# Patient Record
Sex: Female | Born: 1960 | Race: Black or African American | Hispanic: No | Marital: Married | Smoking: Never smoker
Health system: Southern US, Community
[De-identification: ages and names within clinical notes are randomized; demographics above are authoritative.]

## PROBLEM LIST (undated history)

## (undated) DIAGNOSIS — E039 Hypothyroidism, unspecified: Secondary | ICD-10-CM

## (undated) DIAGNOSIS — Z923 Personal history of irradiation: Secondary | ICD-10-CM

## (undated) DIAGNOSIS — Z9221 Personal history of antineoplastic chemotherapy: Secondary | ICD-10-CM

## (undated) DIAGNOSIS — Z803 Family history of malignant neoplasm of breast: Secondary | ICD-10-CM

## (undated) DIAGNOSIS — F419 Anxiety disorder, unspecified: Secondary | ICD-10-CM

## (undated) DIAGNOSIS — Z1371 Encounter for nonprocreative screening for genetic disease carrier status: Secondary | ICD-10-CM

## (undated) DIAGNOSIS — M1990 Unspecified osteoarthritis, unspecified site: Secondary | ICD-10-CM

## (undated) DIAGNOSIS — R51 Headache: Secondary | ICD-10-CM

## (undated) DIAGNOSIS — M2550 Pain in unspecified joint: Secondary | ICD-10-CM

## (undated) DIAGNOSIS — C50919 Malignant neoplasm of unspecified site of unspecified female breast: Secondary | ICD-10-CM

## (undated) HISTORY — PX: BREAST REDUCTION SURGERY: SHX8

## (undated) HISTORY — DX: Pain in unspecified joint: M25.50

## (undated) HISTORY — DX: Unspecified osteoarthritis, unspecified site: M19.90

## (undated) HISTORY — DX: Malignant neoplasm of unspecified site of unspecified female breast: C50.919

## (undated) HISTORY — PX: OTHER SURGICAL HISTORY: SHX169

## (undated) HISTORY — PX: CERVICAL CONE BIOPSY: SUR198

## (undated) HISTORY — DX: Family history of malignant neoplasm of breast: Z80.3

## (undated) HISTORY — PX: STERIOD INJECTION: SHX5046

---

## 1999-11-20 HISTORY — PX: REDUCTION MAMMAPLASTY: SUR839

## 2001-01-01 LAB — CBC AND DIFFERENTIAL
HCT: 44 (ref 36–46)
Hemoglobin: 13.7 (ref 12.0–16.0)
Platelets: 284 (ref 150–399)
WBC: 5.4

## 2001-01-01 LAB — BASIC METABOLIC PANEL
BUN: 10 (ref 4–21)
Creatinine: 1 (ref <=1.1)
Glucose: 81
Potassium: 4.1 (ref 3.4–5.3)
Sodium: 139 (ref 137–147)

## 2001-01-01 LAB — HEPATIC FUNCTION PANEL
ALT: 15 (ref 7–35)
AST: 15 (ref 13–35)
Alkaline Phosphatase: 39 (ref 25–125)

## 2001-01-01 LAB — LIPID PANEL
Cholesterol: 201 — AB (ref 0–200)
HDL: 64 (ref 35–70)
LDL Cholesterol: 123
Triglycerides: 70 (ref 40–160)

## 2001-01-01 LAB — TSH: TSH: 2.2 (ref <=5.90)

## 2001-02-27 ENCOUNTER — Encounter: Payer: Self-pay | Admitting: Internal Medicine

## 2001-02-27 ENCOUNTER — Ambulatory Visit (HOSPITAL_COMMUNITY): Admission: RE | Admit: 2001-02-27 | Discharge: 2001-02-27 | Payer: Self-pay | Admitting: Internal Medicine

## 2001-07-08 ENCOUNTER — Encounter (HOSPITAL_COMMUNITY): Admission: RE | Admit: 2001-07-08 | Discharge: 2001-08-07 | Payer: Self-pay | Admitting: Sports Medicine

## 2002-03-06 ENCOUNTER — Encounter: Payer: Self-pay | Admitting: Internal Medicine

## 2002-03-06 ENCOUNTER — Ambulatory Visit (HOSPITAL_COMMUNITY): Admission: RE | Admit: 2002-03-06 | Discharge: 2002-03-06 | Payer: Self-pay | Admitting: Obstetrics and Gynecology

## 2002-03-06 LAB — HM MAMMOGRAPHY: HM Mammogram: NORMAL (ref 0–4)

## 2002-03-20 ENCOUNTER — Ambulatory Visit (HOSPITAL_COMMUNITY): Admission: RE | Admit: 2002-03-20 | Discharge: 2002-03-20 | Payer: Self-pay | Admitting: General Surgery

## 2002-09-07 ENCOUNTER — Encounter (INDEPENDENT_AMBULATORY_CARE_PROVIDER_SITE_OTHER): Payer: Self-pay | Admitting: *Deleted

## 2002-09-07 ENCOUNTER — Ambulatory Visit (HOSPITAL_BASED_OUTPATIENT_CLINIC_OR_DEPARTMENT_OTHER): Admission: RE | Admit: 2002-09-07 | Discharge: 2002-09-08 | Payer: Self-pay | Admitting: Specialist

## 2002-11-19 DIAGNOSIS — N870 Mild cervical dysplasia: Secondary | ICD-10-CM | POA: Insufficient documentation

## 2003-05-04 ENCOUNTER — Other Ambulatory Visit: Admission: RE | Admit: 2003-05-04 | Discharge: 2003-05-04 | Payer: Self-pay | Admitting: Obstetrics & Gynecology

## 2003-05-05 ENCOUNTER — Other Ambulatory Visit: Admission: RE | Admit: 2003-05-05 | Discharge: 2003-05-05 | Payer: Self-pay | Admitting: Obstetrics & Gynecology

## 2003-06-11 ENCOUNTER — Encounter (INDEPENDENT_AMBULATORY_CARE_PROVIDER_SITE_OTHER): Payer: Self-pay | Admitting: *Deleted

## 2003-06-11 ENCOUNTER — Ambulatory Visit (HOSPITAL_COMMUNITY): Admission: RE | Admit: 2003-06-11 | Discharge: 2003-06-11 | Payer: Self-pay | Admitting: Obstetrics & Gynecology

## 2003-10-29 ENCOUNTER — Other Ambulatory Visit: Admission: RE | Admit: 2003-10-29 | Discharge: 2003-10-29 | Payer: Self-pay | Admitting: Obstetrics & Gynecology

## 2004-06-05 ENCOUNTER — Other Ambulatory Visit: Admission: RE | Admit: 2004-06-05 | Discharge: 2004-06-05 | Payer: Self-pay | Admitting: Obstetrics & Gynecology

## 2005-04-07 ENCOUNTER — Emergency Department (HOSPITAL_COMMUNITY): Admission: EM | Admit: 2005-04-07 | Discharge: 2005-04-07 | Payer: Self-pay | Admitting: Emergency Medicine

## 2005-05-08 LAB — BASIC METABOLIC PANEL
BUN: 7 (ref 4–21)
Creatinine: 0.9 (ref <=1.1)
Glucose: 90
Potassium: 4 (ref 3.4–5.3)
Sodium: 142 (ref 137–147)

## 2005-05-08 LAB — CBC AND DIFFERENTIAL
HCT: 39 (ref 36–46)
Hemoglobin: 12.6 (ref 12.0–16.0)
Platelets: 244 (ref 150–399)
WBC: 6.2

## 2005-05-08 LAB — LIPID PANEL
Cholesterol: 193 (ref 0–200)
HDL: 53 (ref 35–70)
LDL Cholesterol: 123
Triglycerides: 86 (ref 40–160)

## 2005-05-08 LAB — TSH: TSH: 1.97 (ref <=5.90)

## 2005-05-08 LAB — COMPREHENSIVE METABOLIC PANEL: Calcium: 9 (ref 8.7–10.7)

## 2006-01-26 ENCOUNTER — Ambulatory Visit (HOSPITAL_COMMUNITY): Admission: RE | Admit: 2006-01-26 | Discharge: 2006-01-26 | Payer: Self-pay | Admitting: Family Medicine

## 2006-02-03 ENCOUNTER — Emergency Department (HOSPITAL_COMMUNITY): Admission: EM | Admit: 2006-02-03 | Discharge: 2006-02-03 | Payer: Self-pay | Admitting: Emergency Medicine

## 2006-09-22 ENCOUNTER — Emergency Department (HOSPITAL_COMMUNITY): Admission: EM | Admit: 2006-09-22 | Discharge: 2006-09-22 | Payer: Self-pay | Admitting: Emergency Medicine

## 2007-07-23 LAB — LIPID PANEL
Cholesterol: 183 (ref 0–200)
HDL: 56 (ref 35–70)
LDL Cholesterol: 109
Triglycerides: 88 (ref 40–160)

## 2007-07-23 LAB — BASIC METABOLIC PANEL
BUN: 9 (ref 4–21)
Creatinine: 0.8 (ref <=1.1)
Glucose: 80
Potassium: 4.6 (ref 3.4–5.3)
Sodium: 140 (ref 137–147)

## 2007-07-23 LAB — CBC AND DIFFERENTIAL
HCT: 39 (ref 36–46)
Hemoglobin: 13 (ref 12.0–16.0)
Platelets: 283 (ref 150–399)
WBC: 5.5

## 2007-07-23 LAB — TSH: TSH: 1.38 (ref <=5.90)

## 2007-07-23 LAB — HEPATIC FUNCTION PANEL
ALT: 9 (ref 7–35)
AST: 13 (ref 13–35)
Alkaline Phosphatase: 43 (ref 25–125)

## 2007-07-23 LAB — COMPREHENSIVE METABOLIC PANEL: Calcium: 8.8 (ref 8.7–10.7)

## 2008-03-15 LAB — CBC AND DIFFERENTIAL
HCT: 42 (ref 36–46)
Hemoglobin: 14 (ref 12.0–16.0)
Platelets: 291 (ref 150–399)
WBC: 10.3

## 2009-04-20 LAB — LIPID PANEL
Cholesterol: 197 (ref 0–200)
HDL: 53 (ref 35–70)
LDL Cholesterol: 129
Triglycerides: 77 (ref 40–160)

## 2009-04-20 LAB — TSH: TSH: 5.11 (ref <=5.90)

## 2009-04-21 LAB — BASIC METABOLIC PANEL
BUN: 9 (ref 4–21)
Creatinine: 0.9 (ref <=1.1)
Glucose: 98
Potassium: 4.7 (ref 3.4–5.3)
Sodium: 142 (ref 137–147)

## 2009-04-21 LAB — HEPATIC FUNCTION PANEL
ALT: 12 (ref 7–35)
AST: 16 (ref 13–35)
Alkaline Phosphatase: 42 (ref 25–125)

## 2009-05-26 LAB — TSH: TSH: 3.36 (ref 0.41–5.90)

## 2009-06-14 ENCOUNTER — Ambulatory Visit (HOSPITAL_COMMUNITY): Admission: RE | Admit: 2009-06-14 | Discharge: 2009-06-14 | Payer: Self-pay | Admitting: Family Medicine

## 2009-11-28 LAB — TSH: TSH: 1.46 (ref 0.41–5.90)

## 2010-12-23 ENCOUNTER — Emergency Department (HOSPITAL_COMMUNITY)
Admission: EM | Admit: 2010-12-23 | Discharge: 2010-12-23 | Disposition: A | Discharge status: Home / Self Care | Payer: 59 | Attending: Emergency Medicine | Admitting: Emergency Medicine

## 2010-12-23 DIAGNOSIS — R197 Diarrhea, unspecified: Secondary | ICD-10-CM | POA: Insufficient documentation

## 2010-12-23 DIAGNOSIS — N390 Urinary tract infection, site not specified: Secondary | ICD-10-CM | POA: Insufficient documentation

## 2010-12-23 DIAGNOSIS — F411 Generalized anxiety disorder: Secondary | ICD-10-CM | POA: Insufficient documentation

## 2010-12-23 DIAGNOSIS — R55 Syncope and collapse: Secondary | ICD-10-CM | POA: Insufficient documentation

## 2010-12-23 DIAGNOSIS — R112 Nausea with vomiting, unspecified: Secondary | ICD-10-CM | POA: Insufficient documentation

## 2010-12-23 DIAGNOSIS — E039 Hypothyroidism, unspecified: Secondary | ICD-10-CM | POA: Insufficient documentation

## 2010-12-23 DIAGNOSIS — Z79899 Other long term (current) drug therapy: Secondary | ICD-10-CM | POA: Insufficient documentation

## 2010-12-23 LAB — BASIC METABOLIC PANEL
CO2: 25 mEq/L (ref 19–32)
Calcium: 9.1 mg/dL (ref 8.4–10.5)
Chloride: 101 mEq/L (ref 96–112)
Glucose, Bld: 115 mg/dL — ABNORMAL HIGH (ref 70–99)
Sodium: 135 mEq/L (ref 135–145)

## 2010-12-23 LAB — CBC
HCT: 40.4 % (ref 36.0–46.0)
Hemoglobin: 14.4 g/dL (ref 12.0–15.0)
MCH: 28.7 pg (ref 26.0–34.0)
MCHC: 35.6 g/dL (ref 30.0–36.0)
RBC: 5.02 MIL/uL (ref 3.87–5.11)

## 2010-12-23 LAB — URINALYSIS, ROUTINE W REFLEX MICROSCOPIC
Bilirubin Urine: NEGATIVE
Specific Gravity, Urine: >1.03 — ABNORMAL HIGH (ref 1.005–1.030)
Urine Glucose, Fasting: NEGATIVE mg/dL
Urobilinogen, UA: 0.2 mg/dL (ref 0.0–1.0)
pH: 6 (ref 5.0–8.0)

## 2010-12-23 LAB — DIFFERENTIAL
Basophils Absolute: 0 10*3/uL (ref 0.0–0.1)
Lymphocytes Relative: 8 % — ABNORMAL LOW (ref 12–46)
Monocytes Absolute: 0.6 10*3/uL (ref 0.1–1.0)
Monocytes Relative: 7 % (ref 3–12)
Neutro Abs: 7.7 10*3/uL (ref 1.7–7.7)

## 2010-12-23 LAB — URINE MICROSCOPIC-ADD ON

## 2010-12-24 LAB — URINE CULTURE

## 2011-01-06 ENCOUNTER — Emergency Department (HOSPITAL_COMMUNITY)
Admission: EM | Admit: 2011-01-06 | Discharge: 2011-01-06 | Disposition: A | Discharge status: Home / Self Care | Payer: 59 | Attending: Emergency Medicine | Admitting: Emergency Medicine

## 2011-01-06 DIAGNOSIS — F411 Generalized anxiety disorder: Secondary | ICD-10-CM | POA: Insufficient documentation

## 2011-01-06 DIAGNOSIS — R42 Dizziness and giddiness: Secondary | ICD-10-CM | POA: Insufficient documentation

## 2011-01-06 DIAGNOSIS — R110 Nausea: Secondary | ICD-10-CM | POA: Insufficient documentation

## 2011-01-06 DIAGNOSIS — E039 Hypothyroidism, unspecified: Secondary | ICD-10-CM | POA: Insufficient documentation

## 2011-01-06 DIAGNOSIS — Z79899 Other long term (current) drug therapy: Secondary | ICD-10-CM | POA: Insufficient documentation

## 2011-01-06 LAB — URINALYSIS, ROUTINE W REFLEX MICROSCOPIC
Bilirubin Urine: NEGATIVE
Hgb urine dipstick: NEGATIVE
Specific Gravity, Urine: 1.02 (ref 1.005–1.030)
Urine Glucose, Fasting: NEGATIVE mg/dL
pH: 6.5 (ref 5.0–8.0)

## 2011-04-06 NOTE — Op Note (Signed)
NAME:  Robin Franklin, Robin Franklin                             ACCOUNT NO.:  [id number]   MEDICAL RECORD NO.:  [id number]                   PATIENT TYPE:  AMB   LOCATION:  DSC                                  FACILITY:  [facility]   PHYSICIAN:  Earvin Hansen L. Shon Hough, M.D.           DATE OF BIRTH:  1961/10/04   DATE OF PROCEDURE:  09/07/2002  DATE OF DISCHARGE:                                 OPERATIVE REPORT   INDICATIONS FOR PROCEDURE:  A 50 year old lady with severe macromastia and  back and shoulder pain secondary to large pendulous breasts.  The patient  also has increased collection of increased accessory breast tissue to the  right and left axillary and latissimus dorsi regions, but also contributing  to her problems.   PROCEDURE:  Bilateral breast reductions using the inferior pedicle  technique, reduction of accessory breast tissue.   SURGEON:  Yaakov Guthrie. Shon Hough, M.D. and Alethia Berthold.   ASSISTANT:  CFA.   ANESTHESIA:  General anesthesia.   Preoperatively, the patient was set up and drawn for the inferior pedicle  reduction mammoplasty remarking.  The nipple areolar complexes 20 cm from  the suprasternal notch.   DESCRIPTION OF PROCEDURE:  She then underwent general anesthesia intubated  orally.  Prep was done to the chest and breast areas in routine fashion  using Betadine soap and solution and walled of with sterile towels and  drapes so as to make a sterile field.  0.25% Xylocaine with epinephrine was  injected locally, a total of 150 cc per side of 1:400,000 concentration.  After the patient received general anesthesia, intubation, and prep, the  wounds were scored with #15 blade.  The inferior pedicle was  deepithelialized using #20 blade. The medial and lateral fatty dermal  pedicles were excised down to underlying fashion.  Hemostasis was maintained  with the Bovie unit on coagulation.  Next, a new keyhole area was also  debulked and thinned.  Out laterally more tissue was removed  and especially  accessory breast tissue in the axillary regions using liposuction  assistance.  After proper hemostasis, the fascia was then transposed and  stayed with 3-0 Prolene.  Subcutaneous closure was done with 3-0 Monocryl x2  layers and then a running subcuticular stitch of 3-0 Monocryl and 5-0  Monocryl throughout the inverted T. The wounds were drained with #10 fully-  fluted Blake drain which was placed in the depths of the wound and brought  out through the lateralmost portion of the incision and secured with 3-0  Prolene. The wounds were cleansed, Steri-Strips were applied, including  Xeroform, 4 x 4s, ABDs, and Hypafix tape. She withstood the procedures very  well. At the end of the procedure, the nipples were very supple with good  blood supply to the nipple areolar complex.  She will be taken to the  recovery room and then admitted for overnight stay.  She will be  see me back  in the office this Thursday for reexamination.  She is to call me for any  medical problems.  Instructions will be given for wound care and drain care.                                               Yaakov Guthrie. Shon Hough, M.D.    Cathie Hoops  D:  09/07/2002  T:  09/07/2002  Job:  [id number]

## 2011-04-06 NOTE — H&P (Signed)
[facility]  Patient:    Robin Franklin, Robin Franklin Visit Number: [id number] MRN: [id number]          Service Type: OUT Location: RAD Attending Physician:  Cassell Smiles. Dictated by:   Elpidio Anis, M.D. Admit Date:  03/06/2002 Discharge Date: 03/06/2002                           History and Physical  BIRTHDAY:  [birth year]  HISTORY OF PRESENT ILLNESS:  A 50 year old female with a history of a mass of the right posterior neck.  The mass has gradually increased in size.  She has increasing pain.  She was seen in urgent care and started on Keflex.  She has had mild headaches which she feels are related to the mass.  She is scheduled for excision.  PAST HISTORY:  She has no known medical illness.  REVIEW OF SYSTEMS:  Negative.  MEDICATIONS:  Fioricet and Keflex which were given for the new onset of headaches and the neck mass.  ALLERGIES:  She has no known drug allergies.  REVIEW OF SYSTEMS:  Totally negative.  PHYSICAL EXAMINATION:  VITAL SIGNS:  Blood pressure 110/62, pulse 64, respirations 18, weight 189 pounds.  HEENT:  Unremarkable.  NECK:  Supple without JVD or bruit.  She has a 4-cm tender mass on the right neck posteriorly near the hairline.  There is no associated adenopathy.  CHEST:  Clear to auscultation.  HEART:  Regular rate and rhythm without murmur, gallop or rub.  ABDOMEN:  Soft, nontender.  EXTREMITIES:  Unremarkable.  NEUROLOGIC EXAM:  No focal deficits.  IMPRESSION:  Soft tissue mass, right posterior neck.  PLAN:  Excision in the OR. Dictated by:   Elpidio Anis, M.D. Attending Physician:  Cassell Smiles DD:  03/19/02 TD:  03/20/02 Job: [id number]

## 2011-04-06 NOTE — Op Note (Signed)
[facility]  Patient:    Robin Franklin, Robin Franklin Visit Number: [id number] MRN: [id number]          Service Type: DSU Location: DAY Attending Physician:  Dessa Phi Dictated by:   Elpidio Anis, M.D. Proc. Date: 03/20/02 Admit Date:  03/20/2002 Discharge Date: 03/20/2002                             Operative Report  PREOPERATIVE DIAGNOSIS:  Soft tissue mass, right neck.  POSTOPERATIVE DIAGNOSIS:  Soft tissue mass, right neck.  PROCEDURE:  Excision and primary closure of soft tissue mass, right neck.  SURGEON:  Elpidio Anis, M.D.  DESCRIPTION OF PROCEDURE:  Under general anesthesia, the right neck was prepped and draped in a sterile field. An elliptical incision was made surrounding the dorsum of the mass and extended in the subcutaneous tissue. In the deep subcutaneous tissue, electrocautery was used to encircle the mass. It appeared to be fibrotic but also had some sebum type material suggesting that it was an old epidermoid cyst with scarring and inflammation. The mass was excised down to the muscle of the posterior neck. Hemostasis was achieved. The deep tissues were removed because of some residual tracking of the mass. The tissues were closed with 2-0 Biosyn and subcutaneous tissue was closed with 3-0 Biosyn. The skin was closed with running 4-0 Prolene. An Op-Site dressing was placed. The patient tolerated the procedure well. She was awakened from anesthesia and transferred to a bed and taken to the post anesthesia care unit. Dictated by:   Elpidio Anis, M.D. Attending Physician:  Dessa Phi DD:  03/20/02 TD:  03/22/02 Job: [id number]

## 2011-04-06 NOTE — Op Note (Signed)
NAMEROY, SNUFFER                             ACCOUNT NO.:  [id number]   MEDICAL RECORD NO.:  [id number]                   PATIENT TYPE:  OUT   LOCATION:  SDC                                  FACILITY:  [facility]   PHYSICIAN:  Gerrit Friends. Aldona Bar, M.D.                DATE OF BIRTH:  1961-07-18   DATE OF PROCEDURE:  06/11/2003  DATE OF DISCHARGE:                                 OPERATIVE REPORT   PREOPERATIVE DIAGNOSES:  1. Cervical intraepithelial neoplasia I.  2. Human papilloma virus positive.  3. Inadequate colposcopy.   POSTOPERATIVE DIAGNOSES:  1. Cervical intraepithelial neoplasia I.  2. Human papilloma virus positive.  3. Inadequate colposcopy.  4. Pathology pending.   PROCEDURE:  Conization of cervix.   ANESTHESIA:  General.   PROCEDURE:  The patient was taken to the operating room, where after the  satisfactory induction of general anesthetic she was prepped and draped,  having been placed in the modified lithotomy position in the short Allen  stirrups.  A speculum was placed in the vagina, the cervix visualized, and a  single-tooth tenaculum placed on the posterior lip of the cervix.  The  cervix at this time was injected circumferentially with a dilute solution of  1% Xylocaine with epinephrine.  Thereafter using the knife a conization  specimen was taken encompassing the entire exocervix and as much of the  endocervix as possible.  This was carried out until the depth was felt to be  as much as could be taken to encompass the entire endocervix.  The specimen  was removed, labeled appropriately at 10 and 12 o'clock, and sent to  pathology.  The depth of the canal was at least 1.5 cm.  The remaining  endocervix was then curetted and specimens sent labeled separately.  At this  time using the coagulating ball the bed of the cone was coagulated  adequately, providing excellent hemostasis.  The bed of the cone was then  saturated with Monsel's solution for additional  hemostasis.  At this time  the procedure was felt to be complete and was terminated.  The patient was  taken to the recovery room in satisfactory condition, having tolerated the  procedure well.  Estimated blood loss 25 mL.  All counts correct x2.  The  specimen included the conization as well as the residual endocervical  curettings.   The patient will be discharged to home with appropriate instructions.  She  will be asked to return to the office in about two to three weeks for follow-  up.  She will use over-the-counter Aleve or Advil as needed for cramping.  Gerrit Friends. Aldona Bar, M.D.    RMW/MEDQ  D:  06/11/2003  T:  06/11/2003  Job:  [id number]

## 2011-09-19 ENCOUNTER — Telehealth: Payer: Self-pay

## 2011-09-19 NOTE — Telephone Encounter (Signed)
MOVIPREP SPLIT DOSING

## 2011-09-19 NOTE — Telephone Encounter (Signed)
Pt to call back to schedule appt. Has to get her work schedule.     Gastroenterology Pre-Procedure Form  Request Date: 09/18/2011       Requesting Physician: Dr. Sherwood Gambler     PATIENT INFORMATION:  Robin Franklin is a 50 y.o., female (DOB=February 12, 1961).  PROCEDURE: Procedure(s) requested: colonoscopy Procedure Reason: screening for colon cancer  PATIENT REVIEW QUESTIONS: The patient reports the following:   1. Diabetes Melitis: no 2. Joint replacements in the past 12 months: no 3. Major health problems in the past 3 months: no 4. Has an artificial valve or MVP:no 5. Has been advised in past to take antibiotics in advance of a procedure like teeth cleaning: no}    MEDICATIONS & ALLERGIES:    Patient reports the following regarding taking any blood thinners:   Plavix? no Aspirin?no Coumadin?  no  Patient confirms/reports the following medications:  Current Outpatient Prescriptions  Medication Sig Dispense Refill  . ALPRAZolam (XANAX) 0.5 MG tablet Take 0.5 mg by mouth at bedtime as needed.        Marland Kitchen ibuprofen (ADVIL,MOTRIN) 200 MG tablet Take 200 mg by mouth every 6 (six) hours as needed.        Marland Kitchen levothyroxine (SYNTHROID, LEVOTHROID) 50 MCG tablet Take 50 mcg by mouth daily.          Patient confirms/reports the following allergies:  No Known Allergies  Patient is appropriate to schedule for requested procedure(s): yes  AUTHORIZATION INFORMATION Primary Insurance:   ID #:   Group #:  Pre-Cert / Auth required:  Pre-Cert / Auth #:   Secondary Insurance:   ID #:   Group #:  Pre-Cert / Auth required: } Pre-Cert / Auth #:   No orders of the defined types were placed in this encounter.    SCHEDULE INFORMATION: Procedure has been scheduled as follows:  Date:             Time: Location:[facility] Short Stay  This Gastroenterology Pre-Precedure Form is being routed to the following provider(s) for review: Jonette Eva, MD

## 2011-09-24 NOTE — Telephone Encounter (Signed)
Called pt, her schedule still does not permit her to schedule yet. She will call back when she gets a new schedule in a couple of weeks.

## 2011-10-08 NOTE — Telephone Encounter (Signed)
Letter mailed to pt to call when she gets her schedule and we will get the colonoscopy scheduled.

## 2011-10-18 ENCOUNTER — Telehealth: Payer: Self-pay

## 2011-10-18 ENCOUNTER — Other Ambulatory Visit: Payer: Self-pay

## 2011-10-18 DIAGNOSIS — Z139 Encounter for screening, unspecified: Secondary | ICD-10-CM

## 2011-10-18 NOTE — Progress Notes (Signed)
Rx and instructions mailed to pt. NOTE: TIME CHANGE FROM 11:30 TO 12:00.

## 2011-10-18 NOTE — Telephone Encounter (Signed)
MOVI PREP SPLIT DOSING, REGULAR BREAKFAST. CLEAR LIQUIDS AFTER 9 AM.  

## 2011-10-18 NOTE — Telephone Encounter (Signed)
Gastroenterology Pre-Procedure Form  Request Date: 10/17/2011      Requesting Physician: Dr. Sherwood Gambler     PATIENT INFORMATION:  Robin Franklin is a 50 y.o., female (DOB=[birth year]).  PROCEDURE: Procedure(s) requested: colonoscopy Procedure Reason: screening for colon cancer  PATIENT REVIEW QUESTIONS: The patient reports the following:   1. Diabetes Melitis: no 2. Joint replacements in the past 12 months: no 3. Major health problems in the past 3 months: no 4. Has an artificial valve or MVP:no 5. Has been advised in past to take antibiotics in advance of a procedure like teeth cleaning: no}    MEDICATIONS & ALLERGIES:    Patient reports the following regarding taking any blood thinners:   Plavix? no Aspirin?no Coumadin?  no  Patient confirms/reports the following medications:  Current Outpatient Prescriptions  Medication Sig Dispense Refill  . ALPRAZolam (XANAX) 0.5 MG tablet Take 0.5 mg by mouth at bedtime as needed.        Marland Kitchen ibuprofen (ADVIL,MOTRIN) 200 MG tablet Take 200 mg by mouth every 6 (six) hours as needed.        Marland Kitchen levothyroxine (SYNTHROID, LEVOTHROID) 50 MCG tablet Take 50 mcg by mouth daily.          Patient confirms/reports the following allergies:  No Known Allergies  Patient is appropriate to schedule for requested procedure(s): yes  AUTHORIZATION INFORMATION Primary Insurance:   ID #:   Group #:  Pre-Cert / Auth required:  Pre-Cert / Auth #:   Secondary Insurance:   ID #:  Group #:  Pre-Cert / Auth required:  Pre-Cert / Auth #:   No orders of the defined types were placed in this encounter.    SCHEDULE INFORMATION: Procedure has been scheduled as follows:  Date: 11/06/2011      Time:  11:30 AM Location: [facility] Short Stay  This Gastroenterology Pre-Precedure Form is being routed to the following provider(s) for review: Jonette Eva, MD

## 2011-10-29 ENCOUNTER — Encounter (HOSPITAL_COMMUNITY): Payer: Self-pay | Admitting: Pharmacy Technician

## 2011-11-02 ENCOUNTER — Other Ambulatory Visit: Payer: Self-pay | Admitting: Obstetrics & Gynecology

## 2011-11-05 MED ORDER — SODIUM CHLORIDE 0.45 % IV SOLN
Freq: Once | INTRAVENOUS | Status: AC
  Administered 2011-11-06: 11:00:00 via INTRAVENOUS

## 2011-11-06 ENCOUNTER — Encounter (HOSPITAL_COMMUNITY): Payer: Self-pay | Admitting: *Deleted

## 2011-11-06 ENCOUNTER — Ambulatory Visit (HOSPITAL_COMMUNITY)
Admission: RE | Admit: 2011-11-06 | Discharge: 2011-11-06 | Disposition: A | Discharge status: Home / Self Care | Payer: 59 | Source: Ambulatory Visit | Attending: Gastroenterology | Admitting: Gastroenterology

## 2011-11-06 ENCOUNTER — Encounter (HOSPITAL_COMMUNITY)
Admission: RE | Disposition: A | Discharge status: Home / Self Care | Payer: Self-pay | Source: Ambulatory Visit | Attending: Gastroenterology

## 2011-11-06 DIAGNOSIS — Z1211 Encounter for screening for malignant neoplasm of colon: Secondary | ICD-10-CM | POA: Insufficient documentation

## 2011-11-06 DIAGNOSIS — Z139 Encounter for screening, unspecified: Secondary | ICD-10-CM

## 2011-11-06 DIAGNOSIS — K648 Other hemorrhoids: Secondary | ICD-10-CM | POA: Insufficient documentation

## 2011-11-06 DIAGNOSIS — K5730 Diverticulosis of large intestine without perforation or abscess without bleeding: Secondary | ICD-10-CM | POA: Insufficient documentation

## 2011-11-06 HISTORY — PX: COLONOSCOPY: SHX5424

## 2011-11-06 HISTORY — DX: Headache: R51

## 2011-11-06 HISTORY — DX: Hypothyroidism, unspecified: E03.9

## 2011-11-06 HISTORY — DX: Anxiety disorder, unspecified: F41.9

## 2011-11-06 SURGERY — COLONOSCOPY
Anesthesia: Moderate Sedation

## 2011-11-06 MED ORDER — MIDAZOLAM HCL 5 MG/5ML IJ SOLN
INTRAMUSCULAR | Status: AC
  Filled 2011-11-06: qty 10

## 2011-11-06 MED ORDER — MEPERIDINE HCL 100 MG/ML IJ SOLN
INTRAMUSCULAR | Status: DC | PRN
  Administered 2011-11-06 (×2): 25 mg via INTRAVENOUS

## 2011-11-06 MED ORDER — MEPERIDINE HCL 100 MG/ML IJ SOLN
INTRAMUSCULAR | Status: AC
  Filled 2011-11-06: qty 2

## 2011-11-06 MED ORDER — MIDAZOLAM HCL 5 MG/5ML IJ SOLN
INTRAMUSCULAR | Status: DC | PRN
  Administered 2011-11-06: 1 mg via INTRAVENOUS
  Administered 2011-11-06 (×2): 2 mg via INTRAVENOUS

## 2011-11-06 NOTE — Op Note (Signed)
[facility] [address] [place], [place]  [address]  COLONOSCOPY PROCEDURE REPORT  PATIENT:  Robin Franklin, Robin Franklin  MR#:  [id number] BIRTHDATE:  1961/07/05, 50 yrs. old  GENDER:  female  ENDOSCOPIST:  Jonette Eva, MD REF. BY:  Annamaria Helling, M.D. ASSISTANT:  PROCEDURE DATE:  11/06/2011 PROCEDURE:  Colonoscopy 62130  INDICATIONS:  avereage risk screening  MEDICATIONS:   Demerol 50 mg IV, Versed 5 mg IV  DESCRIPTION OF PROCEDURE:    Physical exam was performed. Informed consent was obtained from the patient after explaining the benefits, risks, and alternatives to procedure.  The patient was connected to monitor and placed in left lateral position. Continuous oxygen was provided by nasal cannula and IV medicine administered through an indwelling cannula.  After administration of sedation and rectal exam, the patient's rectum was intubated and the EC-3890Li ([id number]) colonoscope was advanced under direct visualization to the cecum.  The scope was removed slowly by carefully examining the color, texture, anatomy, and integrity mucosa on the way out.  The patient was recovered in endoscopy and discharged home in satisfactory condition. <<PROCEDUREIMAGES>>  FINDINGS:  Diverticula were found in the sigmoid colon.  Internal Hemorrhoids were found. NO POLYPS.  PREP QUALITY: EXCELLENT CECAL W/D TIME:   11 minutes  COMPLICATIONS:    None  ENDOSCOPIC IMPRESSION: 1) MILD Diverticulosis in the sigmoid colon 2) Internal hemorrhoids  RECOMMENDATIONS: TCS in 10 years W/ PEDS SCOPE HIGH FIBER DIET  REPEAT EXAM:  No  ______________________________ Jonette Eva, MD  CC:  Annamaria Helling, M.D.  n. eSIGNED:   Ladawn Boullion at 11/06/2011 01:42 PM  [place] Lack, [phone]

## 2011-11-06 NOTE — H&P (Signed)
  Primary Care Physician:  Cassell Smiles., MD Primary Gastroenterologist:  Dr. Darrick Penna  Pre-Procedure History & Physical: HPI:  Robin Franklin is a 50 y.o. female here for colon ca screening  Past Medical History  Diagnosis Date  . Hypothyroidism   . Headache   . Anxiety     Past Surgical History  Procedure Date  . Breast reduction surgery   . Cervical cone biopsy   . Cyst removed from neck     Prior to Admission medications   Medication Sig Start Date End Date Taking? Authorizing Provider  levothyroxine (SYNTHROID, LEVOTHROID) 50 MCG tablet Take 50 mcg by mouth daily.     Yes Historical Provider, MD  ALPRAZolam Prudy Feeler) 0.5 MG tablet Take 0.5 mg by mouth daily as needed. For anxiety    Historical Provider, MD  ibuprofen (ADVIL,MOTRIN) 200 MG tablet Take 400 mg by mouth every 6 (six) hours as needed. For pain    Historical Provider, MD    Allergies as of 10/18/2011  . (No Known Allergies)    Family History  Problem Relation Age of Onset  . Colon cancer Neg Hx     History   Social History  . Marital Status: Married    Spouse Name: N/A    Number of Children: N/A  . Years of Education: N/A   Occupational History  . Not on file.   Social History Main Topics  . Smoking status: Never Smoker   . Smokeless tobacco: Not on file  . Alcohol Use: No  . Drug Use: No  . Sexually Active:    Other Topics Concern  . Not on file   Social History Narrative  . No narrative on file    Review of Systems: See HPI, otherwise negative ROS lmp: dec 8   Physical Exam: BP 124/81  Pulse 76  Temp(Src) 98.1 F (36.7 C) (Oral)  Resp 14  Ht 5\' 7"  (1.702 m)  Wt 180 lb (81.647 kg)  BMI 28.19 kg/m2  SpO2 100%  LMP 10/27/2011 General:   Alert,  pleasant and cooperative in NAD Head:  Normocephalic and atraumatic. Neck:  Supple; Lungs:  Clear throughout to auscultation.    Heart:  Regular rate and rhythm. Abdomen:  Soft, nontender and nondistended. Normal bowel sounds, without  guarding, and without rebound.   Neurologic:  Alert and  oriented x4;  grossly normal neurologically.  Impression/Plan:    AVERAGE RISK  PLAN: TCS TODAY

## 2011-11-19 ENCOUNTER — Encounter (HOSPITAL_COMMUNITY): Payer: Self-pay | Admitting: Gastroenterology

## 2012-03-14 ENCOUNTER — Other Ambulatory Visit (HOSPITAL_COMMUNITY): Payer: Self-pay | Admitting: Internal Medicine

## 2012-03-14 DIAGNOSIS — M549 Dorsalgia, unspecified: Secondary | ICD-10-CM

## 2012-03-17 ENCOUNTER — Other Ambulatory Visit (HOSPITAL_COMMUNITY): Payer: 59

## 2012-05-26 DIAGNOSIS — M5416 Radiculopathy, lumbar region: Secondary | ICD-10-CM | POA: Insufficient documentation

## 2013-09-01 ENCOUNTER — Other Ambulatory Visit (HOSPITAL_COMMUNITY): Payer: Self-pay | Admitting: Internal Medicine

## 2013-09-01 DIAGNOSIS — M539 Dorsopathy, unspecified: Secondary | ICD-10-CM

## 2013-09-03 ENCOUNTER — Ambulatory Visit (HOSPITAL_COMMUNITY)
Admission: RE | Admit: 2013-09-03 | Discharge: 2013-09-03 | Disposition: A | Discharge status: Home / Self Care | Payer: 59 | Source: Ambulatory Visit | Attending: Internal Medicine | Admitting: Internal Medicine

## 2013-09-03 ENCOUNTER — Encounter (HOSPITAL_COMMUNITY): Payer: Self-pay

## 2013-09-03 DIAGNOSIS — M51379 Other intervertebral disc degeneration, lumbosacral region without mention of lumbar back pain or lower extremity pain: Secondary | ICD-10-CM | POA: Insufficient documentation

## 2013-09-03 DIAGNOSIS — M5126 Other intervertebral disc displacement, lumbar region: Secondary | ICD-10-CM | POA: Insufficient documentation

## 2013-09-03 DIAGNOSIS — M545 Low back pain, unspecified: Secondary | ICD-10-CM | POA: Insufficient documentation

## 2013-09-03 DIAGNOSIS — M539 Dorsopathy, unspecified: Secondary | ICD-10-CM

## 2013-09-03 DIAGNOSIS — M5137 Other intervertebral disc degeneration, lumbosacral region: Secondary | ICD-10-CM | POA: Insufficient documentation

## 2013-11-19 HISTORY — PX: BREAST LUMPECTOMY: SHX2

## 2013-12-22 ENCOUNTER — Other Ambulatory Visit: Payer: Self-pay | Admitting: Obstetrics & Gynecology

## 2014-01-13 ENCOUNTER — Emergency Department (HOSPITAL_COMMUNITY)
Admission: EM | Admit: 2014-01-13 | Discharge: 2014-01-13 | Disposition: A | Discharge status: Home / Self Care | Payer: 59 | Attending: Emergency Medicine | Admitting: Emergency Medicine

## 2014-01-13 ENCOUNTER — Emergency Department (HOSPITAL_COMMUNITY): Payer: 59

## 2014-01-13 ENCOUNTER — Encounter (HOSPITAL_COMMUNITY): Payer: Self-pay | Admitting: Emergency Medicine

## 2014-01-13 DIAGNOSIS — F411 Generalized anxiety disorder: Secondary | ICD-10-CM | POA: Insufficient documentation

## 2014-01-13 DIAGNOSIS — E039 Hypothyroidism, unspecified: Secondary | ICD-10-CM | POA: Insufficient documentation

## 2014-01-13 DIAGNOSIS — R0789 Other chest pain: Secondary | ICD-10-CM | POA: Insufficient documentation

## 2014-01-13 DIAGNOSIS — Z79899 Other long term (current) drug therapy: Secondary | ICD-10-CM | POA: Insufficient documentation

## 2014-01-13 DIAGNOSIS — R079 Chest pain, unspecified: Secondary | ICD-10-CM

## 2014-01-13 LAB — I-STAT CHEM 8, ED
BUN: 14 mg/dL (ref 6–23)
Calcium, Ion: 1.21 mmol/L (ref 1.12–1.23)
Chloride: 103 mEq/L (ref 96–112)
Creatinine, Ser: 1 mg/dL (ref 0.50–1.10)
Glucose, Bld: 82 mg/dL (ref 70–99)
HCT: 38 % (ref 36.0–46.0)
Hemoglobin: 12.9 g/dL (ref 12.0–15.0)
Potassium: 3.5 mEq/L — ABNORMAL LOW (ref 3.7–5.3)
SODIUM: 142 meq/L (ref 137–147)
TCO2: 26 mmol/L (ref 0–100)

## 2014-01-13 LAB — URINALYSIS, ROUTINE W REFLEX MICROSCOPIC
BILIRUBIN URINE: NEGATIVE
Glucose, UA: NEGATIVE mg/dL
HGB URINE DIPSTICK: NEGATIVE
Ketones, ur: NEGATIVE mg/dL
Leukocytes, UA: NEGATIVE
Nitrite: NEGATIVE
PROTEIN: NEGATIVE mg/dL
Specific Gravity, Urine: >1.03 — ABNORMAL HIGH (ref 1.005–1.030)
UROBILINOGEN UA: 0.2 mg/dL (ref 0.0–1.0)
pH: 5.5 (ref 5.0–8.0)

## 2014-01-13 LAB — I-STAT TROPONIN, ED: TROPONIN I, POC: 0 ng/mL (ref 0.00–0.08)

## 2014-01-13 LAB — D-DIMER, QUANTITATIVE: D-Dimer, Quant: 0.43 ug/mL-FEU (ref 0.00–0.48)

## 2014-01-13 NOTE — ED Provider Notes (Signed)
CSN: [id number]     Arrival date & time 01/13/14  1848 History   First MD Initiated Contact with Patient 01/13/14 1858     Chief Complaint  Patient presents with  . Chest Pain     (Consider location/radiation/quality/duration/timing/severity/associated sxs/prior Treatment) Patient is a 53 y.o. female presenting with chest pain. The history is provided by the patient.  Chest Pain  She complains of chest tightness that has been present for 3 days and is getting worse. Today, the pain is constant. The pain feels like a "tightness". The pain does not change with exertion or rest. The pain is 3/10. She is able to work all day, but it bothered her so she thought she would come in for evaluation. She has not had this previously. She has a family history consistent with early coronary artery disease, MI, and death. Does not have high cholesterol. She's never had a stress test. There are no associated symptoms such as nausea, vomiting, weakness, dizziness, and diaphoresis, back, pain or problems walking. She's taking her usual medication. There are no other known modifying factors.  Past Medical History  Diagnosis Date  . Hypothyroidism   . Headache(784.0)   . Anxiety    Past Surgical History  Procedure Laterality Date  . Breast reduction surgery    . Cervical cone biopsy    . Cyst removed from neck    . Colonoscopy  11/06/2011    Procedure: COLONOSCOPY;  Surgeon: Dorothyann Peng, MD;  Location: [facility];  Service: Endoscopy;  Laterality: N/A;  11:30 AM  . Steriod injection     Family History  Problem Relation Age of Onset  . Colon cancer Neg Hx    History  Substance Use Topics  . Smoking status: Never Smoker   . Smokeless tobacco: Not on file  . Alcohol Use: No   OB History   Grav Para Term Preterm Abortions TAB SAB Ect Mult Living                 Review of Systems  Cardiovascular: Positive for chest pain.  All other systems reviewed and are negative.      Allergies   Review of patient's allergies indicates no known allergies.  Home Medications   Current Outpatient Rx  Name  Route  Sig  Dispense  Refill  . ALPRAZolam (XANAX) 0.5 MG tablet   Oral   Take 0.5 mg by mouth daily as needed for anxiety. For anxiety         . Cyanocobalamin (VITAMIN B 12 PO)   Oral   Take 1 tablet by mouth daily.         Marland Kitchen ibuprofen (ADVIL,MOTRIN) 200 MG tablet   Oral   Take 400 mg by mouth every 6 (six) hours as needed for moderate pain.         Marland Kitchen levothyroxine (SYNTHROID, LEVOTHROID) 75 MCG tablet   Oral   Take 75 mcg by mouth daily before breakfast.         . Melatonin 3 MG TABS   Oral   Take 3 mg by mouth at bedtime as needed (Sleep).         . Multiple Vitamin (MULTIVITAMIN WITH MINERALS) TABS tablet   Oral   Take 1 tablet by mouth daily.         . Omega-3 Fatty Acids (FISH OIL) 1000 MG CAPS   Oral   Take 1 capsule by mouth daily.  BP 124/85  Pulse 70  Temp(Src) 97.8 F (36.6 C) (Oral)  Resp 16  Ht 5\' 7"  (1.702 m)  Wt 185 lb (83.915 kg)  BMI 28.97 kg/m2  SpO2 100% Physical Exam  Nursing note and vitals reviewed. Constitutional: She is oriented to person, place, and time. She appears well-developed and well-nourished. No distress.  HENT:  Head: Normocephalic and atraumatic.  Eyes: Conjunctivae and EOM are normal. Pupils are equal, round, and reactive to light.  Neck: Normal range of motion and phonation normal. Neck supple.  Cardiovascular: Normal rate, regular rhythm and intact distal pulses.   Pulmonary/Chest: Effort normal and breath sounds normal. She exhibits no tenderness.  Abdominal: Soft. She exhibits no distension. There is no tenderness. There is no guarding.  Musculoskeletal: Normal range of motion. She exhibits no edema and no tenderness.  No lower leg asymmetry.   Neurological: She is alert and oriented to person, place, and time. She exhibits normal muscle tone.  Skin: Skin is warm and dry.  Psychiatric:  She has a normal mood and affect. Her behavior is normal. Judgment and thought content normal.    ED Course  Procedures (including critical care time)  Medications - No data to display  Patient Vitals for the past 24 hrs:  BP Temp Temp src Pulse Resp SpO2 Height Weight  01/13/14 2040 124/85 mmHg - - 70 16 100 % - -  01/13/14 2030 - - - 61 - 100 % - -  01/13/14 1915 - - - 63 - 100 % - -  01/13/14 1851 135/90 mmHg 97.8 F (36.6 C) Oral 64 20 - 5\' 7"  (1.702 m) 185 lb (83.915 kg)    7:12 PM Reevaluation with update and discussion. After initial assessment and treatment, an updated evaluation reveals no chest pain at rest, the pain returns as a tight feeling with movement and deep breathing. Findings, and limitations of evaluation, discussed with patient, and all questions answered. Cathlene Gardella L    Labs Review Labs Reviewed  URINALYSIS, ROUTINE W REFLEX MICROSCOPIC - Abnormal; Notable for the following:    Specific Gravity, Urine >1.030 (*)    All other components within normal limits  I-STAT CHEM 8, ED - Abnormal; Notable for the following:    Potassium 3.5 (*)    All other components within normal limits  D-DIMER, QUANTITATIVE  Randolm Idol, ED   Imaging Review Dg Chest 2 View  01/13/2014   CLINICAL DATA:  53 year old female with chest pain.  EXAM: CHEST  2 VIEW  COMPARISON:  01/26/2006 chest CT  FINDINGS: The cardiomediastinal silhouette is unremarkable.  There is no evidence of focal airspace disease, pulmonary edema, suspicious pulmonary nodule/mass, pleural effusion, or pneumothorax. No acute bony abnormalities are identified.  IMPRESSION: No active cardiopulmonary disease.   Electronically Signed   By: Hassan Rowan M.D.   On: 01/13/2014 20:12    EKG Interpretation    Date/Time:  Wednesday January 13 2014 18:59:10 EST Ventricular Rate:  61 PR Interval:  168 QRS Duration: 78 QT Interval:  422 QTC Calculation: 424 R Axis:   20 Text Interpretation:  Normal sinus rhythm  Nonspecific T wave abnormality Abnormal ECG When compared with ECG of 06-Jan-2011 03:58, No significant change was found Confirmed by Joushua Dugar  MD, Colandra Ohanian ([id number]) on 01/13/2014 7:08:22 PM            MDM   Final diagnoses:  Chest pain    Nonspecific chest pain, atypical for cardiac disease. She has elevated risk factors, but has  negative. ED evaluation, in stable for discharge. She has a primary care doctor are available for followup care. Doubt ACS, PE, pneumonia, metabolic instability, or serious acterial infection.  Nursing Notes Reviewed/ Care Coordinated Applicable Imaging Reviewed Interpretation of Laboratory Data incorporated into ED treatment  The patient appears reasonably screened and/or stabilized for discharge and I doubt any other medical condition or other [facility] requiring further screening, evaluation, or treatment in the ED at this time prior to discharge.  Plan: Home Medications- usual; Home Treatments- rest; return here if the recommended treatment, does not improve the symptoms; Recommended follow up- PCP for f/u care, consider Cardiac Stress test.    Richarda Blade, MD 01/13/14 2057

## 2014-01-13 NOTE — Discharge Instructions (Signed)
Chest Pain (Nonspecific) °It is often hard to give a specific diagnosis for the cause of chest pain. There is always a chance that your pain could be related to something serious, such as a heart attack or a blood clot in the lungs. You need to follow up with your caregiver for further evaluation. °CAUSES  °· Heartburn. °· Pneumonia or bronchitis. °· Anxiety or stress. °· Inflammation around your heart (pericarditis) or lung (pleuritis or pleurisy). °· A blood clot in the lung. °· A collapsed lung (pneumothorax). It can develop suddenly on its own (spontaneous pneumothorax) or from injury (trauma) to the chest. °· Shingles infection (herpes zoster virus). °The chest wall is composed of bones, muscles, and cartilage. Any of these can be the source of the pain. °· The bones can be bruised by injury. °· The muscles or cartilage can be strained by coughing or overwork. °· The cartilage can be affected by inflammation and become sore (costochondritis). °DIAGNOSIS  °Lab tests or other studies, such as X-rays, electrocardiography, stress testing, or cardiac imaging, may be needed to find the cause of your pain.  °TREATMENT  °· Treatment depends on what may be causing your chest pain. Treatment may include: °· Acid blockers for heartburn. °· Anti-inflammatory medicine. °· Pain medicine for inflammatory conditions. °· Antibiotics if an infection is present. °· You may be advised to change lifestyle habits. This includes stopping smoking and avoiding alcohol, caffeine, and chocolate. °· You may be advised to keep your head raised (elevated) when sleeping. This reduces the chance of acid going backward from your stomach into your esophagus. °· Most of the time, nonspecific chest pain will improve within 2 to 3 days with rest and mild pain medicine. °HOME CARE INSTRUCTIONS  °· If antibiotics were prescribed, take your antibiotics as directed. Finish them even if you start to feel better. °· For the next few days, avoid physical  activities that bring on chest pain. Continue physical activities as directed. °· Do not smoke. °· Avoid drinking alcohol. °· Only take over-the-counter or prescription medicine for pain, discomfort, or fever as directed by your caregiver. °· Follow your caregiver's suggestions for further testing if your chest pain does not go away. °· Keep any follow-up appointments you made. If you do not go to an appointment, you could develop lasting (chronic) problems with pain. If there is any problem keeping an appointment, you must call to reschedule. °SEEK MEDICAL CARE IF:  °· You think you are having problems from the medicine you are taking. Read your medicine instructions carefully. °· Your chest pain does not go away, even after treatment. °· You develop a rash with blisters on your chest. °SEEK IMMEDIATE MEDICAL CARE IF:  °· You have increased chest pain or pain that spreads to your arm, neck, jaw, back, or abdomen. °· You develop shortness of breath, an increasing cough, or you are coughing up blood. °· You have severe back or abdominal pain, feel nauseous, or vomit. °· You develop severe weakness, fainting, or chills. °· You have a fever. °THIS IS AN EMERGENCY. Do not wait to see if the pain will go away. Get medical help at once. Call your local emergency services (911 in [place] Do not drive yourself to the hospital. °MAKE SURE YOU:  °· Understand these instructions. °· Will watch your condition. °· Will get help right away if you are not doing well or get worse. °Document Released: 08/15/2005 Document Revised: 01/28/2012 Document Reviewed: 06/10/2008 °Exit[employer]® Patient Information ©2014 [employer]   [employer]. ° °

## 2014-01-13 NOTE — ED Notes (Addendum)
Chest heaviness since Monday but worse today.  Says it hurts to move her right arm around.

## 2014-10-25 ENCOUNTER — Other Ambulatory Visit (HOSPITAL_COMMUNITY): Payer: Self-pay | Admitting: Internal Medicine

## 2014-10-25 DIAGNOSIS — N6320 Unspecified lump in the left breast, unspecified quadrant: Secondary | ICD-10-CM

## 2014-10-29 ENCOUNTER — Other Ambulatory Visit (HOSPITAL_COMMUNITY): Payer: Self-pay | Admitting: Internal Medicine

## 2014-10-29 DIAGNOSIS — R2232 Localized swelling, mass and lump, left upper limb: Secondary | ICD-10-CM

## 2014-11-02 ENCOUNTER — Encounter (HOSPITAL_COMMUNITY): Payer: 59

## 2014-11-15 ENCOUNTER — Ambulatory Visit
Admission: RE | Admit: 2014-11-15 | Discharge: 2014-11-15 | Disposition: A | Discharge status: Home / Self Care | Payer: 59 | Source: Ambulatory Visit | Attending: Internal Medicine | Admitting: Internal Medicine

## 2014-11-15 ENCOUNTER — Ambulatory Visit
Admission: RE | Admit: 2014-11-15 | Discharge: 2014-11-15 | Disposition: A | Discharge status: Home / Self Care | Payer: 59 | Source: Ambulatory Visit | Attending: Obstetrics & Gynecology | Admitting: Obstetrics & Gynecology

## 2014-11-15 ENCOUNTER — Other Ambulatory Visit: Payer: Self-pay | Admitting: Obstetrics & Gynecology

## 2014-11-15 DIAGNOSIS — R2232 Localized swelling, mass and lump, left upper limb: Secondary | ICD-10-CM

## 2014-11-17 ENCOUNTER — Other Ambulatory Visit: Payer: Self-pay | Admitting: Obstetrics & Gynecology

## 2014-11-17 DIAGNOSIS — C801 Malignant (primary) neoplasm, unspecified: Secondary | ICD-10-CM

## 2014-11-17 DIAGNOSIS — R922 Inconclusive mammogram: Secondary | ICD-10-CM

## 2014-11-17 DIAGNOSIS — C779 Secondary and unspecified malignant neoplasm of lymph node, unspecified: Secondary | ICD-10-CM

## 2014-11-18 ENCOUNTER — Telehealth: Payer: Self-pay | Admitting: *Deleted

## 2014-11-18 DIAGNOSIS — C50912 Malignant neoplasm of unspecified site of left female breast: Secondary | ICD-10-CM

## 2014-11-18 NOTE — Telephone Encounter (Signed)
Attempted to call pt to schedule [facility] for 11/24/14 at 1230

## 2014-11-18 NOTE — Telephone Encounter (Signed)
Attempted to call patient to schedule for [facility] 11/24/14.  Unable to leave message. Will try again later.

## 2014-11-22 ENCOUNTER — Telehealth: Payer: Self-pay | Admitting: *Deleted

## 2014-11-22 NOTE — Telephone Encounter (Signed)
Confirmed [facility] for 11/24/14 at 1230.  Instructions and contact information given.

## 2014-11-23 ENCOUNTER — Ambulatory Visit
Admission: RE | Admit: 2014-11-23 | Discharge: 2014-11-23 | Disposition: A | Discharge status: Home / Self Care | Payer: 59 | Source: Ambulatory Visit | Attending: Obstetrics & Gynecology | Admitting: Obstetrics & Gynecology

## 2014-11-23 DIAGNOSIS — C801 Malignant (primary) neoplasm, unspecified: Principal | ICD-10-CM

## 2014-11-23 DIAGNOSIS — C779 Secondary and unspecified malignant neoplasm of lymph node, unspecified: Secondary | ICD-10-CM

## 2014-11-23 DIAGNOSIS — R922 Inconclusive mammogram: Secondary | ICD-10-CM

## 2014-11-23 MED ORDER — GADOBENATE DIMEGLUMINE 529 MG/ML IV SOLN: 15.0000 mL | Freq: Once | INTRAVENOUS | Status: AC | PRN

## 2014-11-24 ENCOUNTER — Telehealth: Payer: Self-pay | Admitting: Oncology

## 2014-11-24 ENCOUNTER — Encounter: Payer: Self-pay | Admitting: Oncology

## 2014-11-24 ENCOUNTER — Other Ambulatory Visit: Payer: Self-pay | Admitting: *Deleted

## 2014-11-24 ENCOUNTER — Ambulatory Visit
Admission: RE | Admit: 2014-11-24 | Discharge: 2014-11-24 | Disposition: A | Discharge status: Home / Self Care | Payer: 59 | Source: Ambulatory Visit | Attending: Radiation Oncology | Admitting: Radiation Oncology

## 2014-11-24 ENCOUNTER — Ambulatory Visit: Payer: 59

## 2014-11-24 ENCOUNTER — Ambulatory Visit (HOSPITAL_BASED_OUTPATIENT_CLINIC_OR_DEPARTMENT_OTHER): Payer: 59 | Admitting: Oncology

## 2014-11-24 ENCOUNTER — Other Ambulatory Visit (HOSPITAL_BASED_OUTPATIENT_CLINIC_OR_DEPARTMENT_OTHER): Payer: 59

## 2014-11-24 VITALS — BP 123/78 | HR 63 | Temp 98.5°F | Resp 18 | Ht 67.0 in | Wt 168.6 lb

## 2014-11-24 DIAGNOSIS — R2232 Localized swelling, mass and lump, left upper limb: Secondary | ICD-10-CM

## 2014-11-24 DIAGNOSIS — C801 Malignant (primary) neoplasm, unspecified: Secondary | ICD-10-CM

## 2014-11-24 DIAGNOSIS — C50912 Malignant neoplasm of unspecified site of left female breast: Secondary | ICD-10-CM

## 2014-11-24 DIAGNOSIS — R2230 Localized swelling, mass and lump, unspecified upper limb: Secondary | ICD-10-CM | POA: Insufficient documentation

## 2014-11-24 DIAGNOSIS — C773 Secondary and unspecified malignant neoplasm of axilla and upper limb lymph nodes: Secondary | ICD-10-CM

## 2014-11-24 DIAGNOSIS — Z171 Estrogen receptor negative status [ER-]: Secondary | ICD-10-CM

## 2014-11-24 LAB — COMPREHENSIVE METABOLIC PANEL (CC13)
ALBUMIN: 4.3 g/dL (ref 3.5–5.0)
ALK PHOS: 67 U/L (ref 40–150)
ALT: 12 U/L (ref 0–55)
AST: 14 U/L (ref 5–34)
Anion Gap: 9 mEq/L (ref 3–11)
BUN: 19 mg/dL (ref 7.0–26.0)
CALCIUM: 9.9 mg/dL (ref 8.4–10.4)
CO2: 29 meq/L (ref 22–29)
Chloride: 106 mEq/L (ref 98–109)
Creatinine: 1 mg/dL (ref 0.6–1.1)
EGFR: 78 mL/min/{1.73_m2} — ABNORMAL LOW (ref >90)
Glucose: 79 mg/dl (ref 70–140)
POTASSIUM: 4.1 meq/L (ref 3.5–5.1)
Sodium: 144 mEq/L (ref 136–145)
Total Bilirubin: 0.36 mg/dL (ref 0.20–1.20)
Total Protein: 7.6 g/dL (ref 6.4–8.3)

## 2014-11-24 LAB — CBC WITH DIFFERENTIAL/PLATELET
BASO%: 1 % (ref 0.0–2.0)
BASOS ABS: 0.1 10*3/uL (ref 0.0–0.1)
EOS ABS: 0.1 10*3/uL (ref 0.0–0.5)
EOS%: 1.7 % (ref 0.0–7.0)
HCT: 40 % (ref 34.8–46.6)
HEMOGLOBIN: 12.8 g/dL (ref 11.6–15.9)
LYMPH#: 2.1 10*3/uL (ref 0.9–3.3)
LYMPH%: 40.1 % (ref 14.0–49.7)
MCH: 26.7 pg (ref 25.1–34.0)
MCHC: 32 g/dL (ref 31.5–36.0)
MCV: 83.3 fL (ref 79.5–101.0)
MONO#: 0.4 10*3/uL (ref 0.1–0.9)
MONO%: 8.4 % (ref 0.0–14.0)
NEUT#: 2.5 10*3/uL (ref 1.5–6.5)
NEUT%: 48.8 % (ref 38.4–76.8)
Platelets: 251 10*3/uL (ref 145–400)
RBC: 4.8 10*6/uL (ref 3.70–5.45)
RDW: 13.2 % (ref 11.2–14.5)
WBC: 5.2 10*3/uL (ref 3.9–10.3)

## 2014-11-24 NOTE — Progress Notes (Signed)
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 female who is seen today through the courtesy of Dr. Barry Dienes at the breast multidisciplinary clinic for evaluation of her T0 N1 metastatic carcinoma to left axilla of suspected occult left breast origin.  The patient states that she noted the recent development of a left axillary mass.  Mammography and ultrasonography on 11/15/2014 showed a 3.2 x 2.6 x 3.2 cm axillary mass.  He was no evidence for a left breast primary.  On 11/15/2014 she underwent an ultrasound-guided biopsy and this was diagnostic for metastatic adenocarcinoma of suspected breast origin.  Tumor was triple negative.  Ki-67 was 90%.  Breast MR on 11/23/2014 showed a 4.6 cm left axillary mass with prominent smaller lymph nodes adjacent to the mass.  There was no evidence for internal mammary adenopathy or left breast or right breast primary.  She seen today with Dr. Jana Hakim and Dr. Barry Dienes.  PREVIOUS RADIATION THERAPY: No   PAST MEDICAL HISTORY:  has a past medical history of Hypothyroidism; Headache(784.0); Anxiety; Breast cancer; Arthritis; and Joint pain.     PAST SURGICAL HISTORY:  Past Surgical History  Procedure Laterality Date  . Breast reduction surgery    . Cervical cone biopsy    . Cyst removed from neck    . Colonoscopy  11/06/2011    Procedure: COLONOSCOPY;  Surgeon: Dorothyann Peng, MD;  Location: [facility];  Service: Endoscopy;  Laterality: N/A;  11:30 AM  . Steriod injection     History of breast reduction surgery in 2001 with Dr. Cristine Polio.  FAMILY HISTORY: family history  includes Breast cancer in her mother. There is no history of Colon cancer.  Her father died of a heart attack at age 34.  Her mother died from breast cancer at 71.   SOCIAL HISTORY: Married, 2 children.  She works in Therapist, art for [employer].  ALLERGIES: Review of patient's allergies indicates no known allergies.   MEDICATIONS:  Current Outpatient Prescriptions  Medication Sig Dispense Refill  . ALPRAZolam (XANAX) 0.5 MG tablet Take 0.5 mg by mouth daily as needed for anxiety. For anxiety    . Cyanocobalamin (VITAMIN B 12 PO) Take 1 tablet by mouth daily.    Marland Kitchen ibuprofen (ADVIL,MOTRIN) 200 MG tablet Take 400 mg by mouth every 6 (six) hours as needed for moderate pain.    Marland Kitchen levothyroxine (SYNTHROID, LEVOTHROID) 75 MCG tablet Take 75 mcg by mouth daily before breakfast.    . Melatonin 3 MG TABS Take 3 mg by mouth at bedtime as needed (Sleep).    . Multiple Vitamin (MULTIVITAMIN WITH MINERALS) TABS tablet Take 1 tablet by mouth daily.    . Omega-3 Fatty Acids (FISH OIL) 1000 MG CAPS Take 1 capsule by mouth daily.    Marland Kitchen UNABLE TO FIND Apply topically 4 (four) times daily. Keto%/Bac2%/Gab5%/Lido5%     No current facility-administered medications for this encounter.     REVIEW OF SYSTEMS:  Pertinent items are noted in HPI.    PHYSICAL EXAM: Alert and oriented 54 year old  African-American female appearing younger than her stated age.  Head and neck examination: Grossly unremarkable.  Nodes: There is no palpable cervical or supraclavicular lymphadenopathy.  Within the left axilla is a 4-5 cm mass which is mobile.  Breasts: There are surgical scars from bilateral breast reduction surgery.  No masses are appreciated.  Extremities: Without edema.    LABORATORY DATA:  Lab Results  Component Value Date   WBC 5.2 11/24/2014   HGB 12.8 11/24/2014   HCT 40.0 11/24/2014   MCV 83.3 11/24/2014   PLT 251 11/24/2014   Lab Results  Component Value Date   NA 144 11/24/2014   K 4.1 11/24/2014    CL 103 01/13/2014   CO2 29 11/24/2014   Lab Results  Component Value Date   ALT 12 11/24/2014   AST 14 11/24/2014   ALKPHOS 67 11/24/2014   BILITOT 0.36 11/24/2014      IMPRESSION: Stage II A (T0 N1 MX) metastatic carcinoma to her left axilla of suspected occult breast origin.  We will complete her staging with a PET scan.  Our plan is to proceed with a left axillary dissection followed by adjuvant chemotherapy and then left breast and probable nodal irradiation.  We discussed the potential acute and late toxicities of radiation therapy.  We also discussed deep inspiration breath-hold technology to avoid cardiac irradiation.  I can see her nearing the end of her adjuvant chemotherapy.   PLAN: As discussed above.  I spent 30  minutes face to face with the patient and more than 50% of that time was spent in counseling and/or coordination of care.

## 2014-11-24 NOTE — Telephone Encounter (Signed)
per pof to sch pt appt-cld & pt no vm-will mail copy o fsch for 2/12 appt per GM

## 2014-11-24 NOTE — Progress Notes (Signed)
Checked in new patient with no issues prior to seeing the dr. She has appt card and has not traveled. I gave her [facility] packet and advised of alight and she will let me know if interested.

## 2014-11-24 NOTE — Telephone Encounter (Signed)
sent [facility] email for pre-cert-will call pt w/HO appt after reply-CS will schPET

## 2014-11-24 NOTE — Progress Notes (Signed)
[place]  [phone] [phone]     ID: Robin Franklin DOB: 1961/01/11  MR#: [id number]  [facility]#:[id number]  Patient Care Team: Redmond School, MD as PCP - General (Internal Medicine) Stark Klein, MD as Consulting Physician (General Surgery) Chauncey Cruel, MD as Consulting Physician (Oncology) Rexene Edison, MD as Consulting Physician (Radiation Oncology) Vania Rea, MD as Consulting Physician (Obstetrics and Gynecology) Newman Pies, MD as Consulting Physician (Neurosurgery) OTHER MD:  CHIEF COMPLAINT: Triple negative breast cancer   CURRENT TREATMENT: Awaiting definitive surgery  BREAST CANCER HISTORY: Robin Franklin herself noted a mass in her left axilla sometime in late November or she brought it to Dr. Nolon Rod attention and on 11/15/2014 she had a left mammogram and left ultrasound at the breast Center. The breast density was category 8. The left breast was entirely unremarkable, with no masses, distortion, or calcifications. In the left axilla there was a mass which was palpable and moderately firm. By ultrasound it measured 3.2 cm. There were no other axillary masses of concern.  Biopsy of the left axillary mass the same day showed ([id number]) a high-grade carcinoma which was estrogen and progesterone receptor negative, HER-2 negative, with a signals ratio of 1.53 and the number per cell of 2.60, with an MIB-1 of 95%. It was also gross cystic disease fluid protein negative, and negative for cytokeratin 20 and TTF-1. It was positive for cytokeratin 7. This was felt to be consistent with a breast primary, but gynecologic and other malignancies were also in the differential.  In general 04/08/2015 the patient had a diagnostic right mammogram, which was negative. On the same day she had bilateral breast MRI. There were no masses or abnormal enhancement in either breast. In the left axilla there was a round enhancing mass measuring 4.6 cm. There were  mildly prominent smaller immediately adjacent lymph nodes. He was no right axillary or internal mammary adenopathy. An incidental lipoma was noted in the left lateral chest wall.  Her subsequent history is as detailed below.  INTERVAL HISTORY: Robin Franklin was evaluated in the multidisciplinary breast cancer clinic 11/24/2014, accompanied by her husband Robin Franklin and her son Robin Franklin. Her case was also presented at the multidisciplinary breast cancer clinic that same morning.  REVIEW OF SYSTEMS: Aside from the left axillary mass itself, there were no specific symptoms leading to the diagnostic mammogram.. The patient has occasional headaches but denies visual changes, nausea, vomiting, stiff neck, dizziness, or gait imbalance. There has been no cough, phlegm production, or pleurisy, no chest pain or pressure, and no change in bowel or bladder habits. The patient denies fever, rash, bleeding, unexplained fatigue or unexplained weight loss. She has problems with insomnia, night sweats and hot flashes, and some back and hip pain which is not more intense or persistent than usual. A detailed review of systems was otherwise entirely negative.  PAST MEDICAL HISTORY: Past Medical History  Diagnosis Date  . Hypothyroidism   . Headache(784.0)   . Anxiety   . Breast cancer   . Arthritis   . Joint pain     PAST SURGICAL HISTORY: Past Surgical History  Procedure Laterality Date  . Breast reduction surgery    . Cervical cone biopsy    . Cyst removed from neck    . Colonoscopy  11/06/2011    Procedure: COLONOSCOPY;  Surgeon: Dorothyann Peng, MD;  Location: [facility];  Service: Endoscopy;  Laterality: N/A;  11:30 AM  . Steriod injection  FAMILY HISTORY Family History  Problem Relation Age of Onset  . Colon cancer Neg Hx   . Breast cancer Mother   the patient's father died from a myocardial infarction at the age of 59. [name] died from breast cancer at the age of 82. It was diagnosed at age  8. The patient has 2 brothers, one sister. There is no other history of breast or ovarian cancer in the family to her knowledge.  GYNECOLOGIC HISTORY:  No LMP recorded. Menarche age 54, first live birth age 51. The patient is GX P2. She used oral contraceptives for a few months remotely with no side effects. She stopped having periods in September 2014. 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 Her husband Robin Franklin is a Merchant navy officer with [employer]. At home it's just the 2 of them, with no pets. Robin Franklin children from an earlier marriage are Robin Franklin. Robin Franklin, who lives in [place] and is a Public relations account executive; and Robin Franklin, who lives in [place] and is a Biochemist, clinical. The patient has 4 grandchildren +2 by adoption. She attends a [employer] locally    ADVANCED DIRECTIVES: Not in place   HEALTH MAINTENANCE: History  Substance Use Topics  . Smoking status: Never Smoker   . Smokeless tobacco: Not on file  . Alcohol Use: No     Colonoscopy: 2013/ [facility]  PAP: February 2015  Bone density: Never  Lipid panel:  No Known Allergies  Current Outpatient Prescriptions  Medication Sig Dispense Refill  . ALPRAZolam (XANAX) 0.5 MG tablet Take 0.5 mg by mouth daily as needed for anxiety. For anxiety    . ibuprofen (ADVIL,MOTRIN) 200 MG tablet Take 400 mg by mouth every 6 (six) hours as needed for moderate pain.    Marland Kitchen levothyroxine (SYNTHROID, LEVOTHROID) 75 MCG tablet Take 75 mcg by mouth daily before breakfast.    . UNABLE TO FIND Apply topically 4 (four) times daily. Keto%/Bac2%/Gab5%/Lido5%    . Cyanocobalamin (VITAMIN B 12 PO) Take 1 tablet by mouth daily.    . Melatonin 3 MG TABS Take 3 mg by mouth at bedtime as needed (Sleep).    . Multiple Vitamin (MULTIVITAMIN WITH MINERALS) TABS tablet Take 1 tablet by mouth daily.    . Omega-3 Fatty Acids (FISH OIL) 1000 MG CAPS Take 1 capsule by mouth daily.     No  current facility-administered medications for this visit.    OBJECTIVE: Middle-aged African-American woman who appears stated age 9 Vitals:   11/24/14 1259  BP: 123/78  Pulse: 63  Temp: 98.5 F (36.9 C)  Resp: 18     Body mass index is 26.4 kg/(m^2).    ECOG FS:1 - Symptomatic but completely ambulatory  Ocular: Sclerae unicteric, pupils equal, round and reactive to light Ear-nose-throat: Oropharynx clear, dentition fair Lymphatic: No cervical or supraclavicular adenopathy Lungs no rales or rhonchi, good excursion bilaterally Heart regular rate and rhythm, no murmur appreciated Abd soft, nontender, positive bowel sounds MSK no focal spinal tenderness, no left upper extremity edema Neuro: non-focal, well-oriented, appropriate affect Breasts: The right breast is unremarkable. I do not palpate any masses in the left breast and there are no skin or nipple changes of concern. There is an easily palpable mass in the left axilla, which feels rubbery and is slightly movable.   LAB RESULTS:  CMP     Component Value Date/Time   NA 144 11/24/2014 1248  NA 142 01/13/2014 1928   K 4.1 11/24/2014 1248   K 3.5* 01/13/2014 1928   CL 103 01/13/2014 1928   CO2 29 11/24/2014 1248   CO2 25 12/23/2010 1229   GLUCOSE 79 11/24/2014 1248   GLUCOSE 82 01/13/2014 1928   BUN 19.0 11/24/2014 1248   BUN 14 01/13/2014 1928   CREATININE 1.0 11/24/2014 1248   CREATININE 1.00 01/13/2014 1928   CALCIUM 9.9 11/24/2014 1248   CALCIUM 9.1 12/23/2010 1229   PROT 7.6 11/24/2014 1248   ALBUMIN 4.3 11/24/2014 1248   AST 14 11/24/2014 1248   ALT 12 11/24/2014 1248   ALKPHOS 67 11/24/2014 1248   BILITOT 0.36 11/24/2014 1248   GFRNONAA >60 12/23/2010 1229   GFRAA  12/23/2010 1229    >60        The eGFR has been calculated using the MDRD equation. This calculation has not been validated in all clinical situations. eGFR's persistently <60 mL/min signify possible Chronic Kidney Disease.    INo  results found for: SPEP, UPEP  Lab Results  Component Value Date   WBC 5.2 11/24/2014   NEUTROABS 2.5 11/24/2014   HGB 12.8 11/24/2014   HCT 40.0 11/24/2014   MCV 83.3 11/24/2014   PLT 251 11/24/2014      Chemistry      Component Value Date/Time   NA 144 11/24/2014 1248   NA 142 01/13/2014 1928   K 4.1 11/24/2014 1248   K 3.5* 01/13/2014 1928   CL 103 01/13/2014 1928   CO2 29 11/24/2014 1248   CO2 25 12/23/2010 1229   BUN 19.0 11/24/2014 1248   BUN 14 01/13/2014 1928   CREATININE 1.0 11/24/2014 1248   CREATININE 1.00 01/13/2014 1928      Component Value Date/Time   CALCIUM 9.9 11/24/2014 1248   CALCIUM 9.1 12/23/2010 1229   ALKPHOS 67 11/24/2014 1248   AST 14 11/24/2014 1248   ALT 12 11/24/2014 1248   BILITOT 0.36 11/24/2014 1248       No results found for: LABCA2  No components found for: LABCA125  No results for input(s): INR in the last 168 hours.  Urinalysis    Component Value Date/Time   COLORURINE YELLOW 01/13/2014 1924   APPEARANCEUR CLEAR 01/13/2014 1924   LABSPEC >1.030* 01/13/2014 1924   PHURINE 5.5 01/13/2014 1924   GLUCOSEU NEGATIVE 01/13/2014 1924   HGBUR NEGATIVE 01/13/2014 1924   BILIRUBINUR NEGATIVE 01/13/2014 1924   KETONESUR NEGATIVE 01/13/2014 1924   PROTEINUR NEGATIVE 01/13/2014 1924   UROBILINOGEN 0.2 01/13/2014 1924   NITRITE NEGATIVE 01/13/2014 1924   LEUKOCYTESUR NEGATIVE 01/13/2014 1924    STUDIES: Mr Breast Bilateral W Wo Contrast  11/23/2014   CLINICAL DATA:  Recently diagnosed carcinoma within left axillary mass.  EXAM: BILATERAL BREAST MRI WITH AND WITHOUT CONTRAST  TECHNIQUE: Multiplanar, multisequence MR images of both breasts were obtained prior to and following the intravenous administration of 81m of MultiHance  THREE-DIMENSIONAL MR IMAGE RENDERING ON INDEPENDENT WORKSTATION:  Three-dimensional MR images were rendered by post-processing of the original MR data on an independent workstation. The three-dimensional MR  images were interpreted, and findings are reported in the following complete MRI report for this study. Three dimensional images were evaluated at the independent DynaCad workstation  COMPARISON:  Mammogram dated 11/23/2014 as well as left axillary ultrasound dated 11/15/2014. Also, comparison with mammogram dated 12/22/2013  FINDINGS: Breast composition: a.  Almost entirely fat.  Background parenchymal enhancement: Minimal  Right breast: No mass or abnormal enhancement.  Specifically, there is no enhancement suspicious for malignancy.  Left breast: No mass or abnormal enhancement. Specifically, there is no enhancement suspicious for malignancy.  Lymph nodes: There is a round enhancing mass within the left axilla. This represents the left axillary mass recently biopsied under ultrasound guidance. This is associated with washout enhancement kinetics. This measures 4.6 x 4.3 x 3.3 cm in size. There are mildly prominent smaller immediately adjacent lymph nodes present. There is no right axillary adenopathy or internal mammary adenopathy.  Ancillary findings: There is an incidental 1.5 cm subcutaneous lipoma seen associated with the left lateral chest wall. This is best visualized on the T2 images. There are no additional findings.  IMPRESSION: 1. 4.6 cm enhancing mass within the left axilla consistent with the known carcinoma within the left axilla (recently biopsied under ultrasound guidance). No findings within either breast to suggest an underlying primary tumor.  2. Incidental 1.5 cm subcutaneous lipoma associated with the left lateral chest wall.  RECOMMENDATION: Treatment plan.  BI-RADS CATEGORY  6: Known biopsy-proven malignancy.   Electronically Signed   By: Luberta Robertson M.D.   On: 11/23/2014 10:58   [place] Extrem Up Left Ltd  11/15/2014   CLINICAL DATA:  Left axillary mass.  EXAM: DIGITAL DIAGNOSTIC  LEFT MAMMOGRAM WITH CAD  ULTRASOUND LEFT BREAST  COMPARISON:  12/22/2013  ACR Breast Density Category a: The  breast tissue is almost entirely fatty.  FINDINGS: There is an oval circumscribed mass in the left axilla, only partly imaged on the MLO view. Not visualized or included on the field of view from the prior study. No left breast mass or distortion. No calcifications.  Mammographic images were processed with CAD.  On physical exam, there is a palpable mobile mass in the left axilla, moderately firm.  Ultrasound is performed, showing an oval, hypoechoic, solid, circumscribed mass in the left axilla measuring 3.2 cm x 2.6 cm x 3.2 cm. There is mild internal blood flow on color Doppler imaging. There is an adjacent normal sized lymph node with a normal cortical thickness and normal hilar fat. No other axillary masses. No breast mass visualized in the upper outer quadrant.  IMPRESSION: Left axillary mass, most likely an enlarged, abnormal lymph node. A primary breast malignancy is possible. Biopsy is indicated.  RECOMMENDATION: Ultrasound-guided core needle biopsy of the left axillary mass. This will be arranged either today or as soon as available.  I have discussed the findings and recommendations with the patient. Results were also provided in writing at the conclusion of the visit. If applicable, a reminder letter will be sent to the patient regarding the next appointment.  BI-RADS CATEGORY  4: Suspicious.   Electronically Signed   By: Lajean Manes M.D.   On: 11/15/2014 08:39   Mm Digital Diagnostic Unilat L  11/15/2014   CLINICAL DATA:  Left axillary mass.  EXAM: DIGITAL DIAGNOSTIC  LEFT MAMMOGRAM WITH CAD  ULTRASOUND LEFT BREAST  COMPARISON:  12/22/2013  ACR Breast Density Category a: The breast tissue is almost entirely fatty.  FINDINGS: There is an oval circumscribed mass in the left axilla, only partly imaged on the MLO view. Not visualized or included on the field of view from the prior study. No left breast mass or distortion. No calcifications.  Mammographic images were processed with CAD.  On physical  exam, there is a palpable mobile mass in the left axilla, moderately firm.  Ultrasound is performed, showing an oval, hypoechoic, solid, circumscribed mass in the left axilla measuring  3.2 cm x 2.6 cm x 3.2 cm. There is mild internal blood flow on color Doppler imaging. There is an adjacent normal sized lymph node with a normal cortical thickness and normal hilar fat. No other axillary masses. No breast mass visualized in the upper outer quadrant.  IMPRESSION: Left axillary mass, most likely an enlarged, abnormal lymph node. A primary breast malignancy is possible. Biopsy is indicated.  RECOMMENDATION: Ultrasound-guided core needle biopsy of the left axillary mass. This will be arranged either today or as soon as available.  I have discussed the findings and recommendations with the patient. Results were also provided in writing at the conclusion of the visit. If applicable, a reminder letter will be sent to the patient regarding the next appointment.  BI-RADS CATEGORY  4: Suspicious.   Electronically Signed   By: Lajean Manes M.D.   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 Patient is scheduled for bilateral breast MRI later today and it has been almost 1 year since the prior right mammogram. Right mammogram is needed for correlation with the MRI. She had a diagnostic left mammogram on 11/15/2014. Prior bilateral breast reduction.  EXAM: DIGITAL DIAGNOSTIC RIGHT MAMMOGRAM WITH CAD  COMPARISON:  12/22/2013.  ACR Breast Density Category a: The breast tissue is almost entirely fatty.  FINDINGS: CC and MLO views of the right breast were obtained. No findings suspicious for malignancy in either breast.  Mammographic images were processed with CAD.  IMPRESSION: No mammographic evidence of malignancy, right breast.  RECOMMENDATION: Treatment plan for the metastatic carcinoma  in the left axillary lymph node.  I have discussed the findings and recommendations with the patient. Results were also provided in writing at the conclusion of the visit. If applicable, a reminder letter will be sent to the patient regarding the next appointment.  BI-RADS CATEGORY  1: Negative.   Electronically Signed   By: Evangeline Dakin M.D.   On: 11/23/2014 08:49   [place] Lt Breast Bx W Loc Dev 1st Lesion Img Bx Spec US Guide  11/17/2014   ADDENDUM REPORT: 11/17/2014 14:12  ADDENDUM: Patient's pathology has returned. The lymph node is consistent with metastatic breast carcinoma. Patient will undergo follow-up breast MRI on 11/23/2014 to evaluate for possible mammographically and ultrasound occult primary breast malignancy. Patient also has an appointment with the multi disciplinary clinic on 11/24/2014.  I met with this patient today, 11/17/2014, to give her her results and discuss her findings. Patient was understandably upset and did not expect this resolved. I did explain need for the breast MRI prior to her clinic appointment. Patient is doing well post biopsy with no complication.   Electronically Signed   By: Lajean Manes M.D.   On: 11/17/2014 14:12   11/17/2014   CLINICAL DATA:  Patient presents for ultrasound-guided core needle biopsy of a solitary 3 cm left axillary mass, which could reflect an abnormal enlarged lymph node or possibly a breast mass arising from the axillary tail.  EXAM: ULTRASOUND GUIDED LEFT BREAST CORE NEEDLE BIOPSY  COMPARISON:  Previous exams.  PROCEDURE: I met with the patient and we discussed the procedure of ultrasound-guided biopsy, including benefits and alternatives. We discussed the high likelihood of a successful procedure. We discussed the risks of the procedure including infection, bleeding, tissue injury, clip migration, and inadequate sampling. Informed written consent was given. The usual  time-out protocol was performed immediately prior to the procedure.  Using  sterile technique and 2% Lidocaine as local anesthetic, under direct ultrasound visualization, a 12 gauge vacuum-assisteddevice was used to perform biopsy of the left axillary mass using a medial approach.  IMPRESSION: Ultrasound-guided biopsy of a left axillary mass or abnormal/ enlarged lymph node. No apparent complications.  Electronically Signed: By: Lajean Manes M.D. On: 11/15/2014 15:39    ASSESSMENT: 54 y.o. [place] woman status post biopsy 11/15/2014 of a 4.6 cm left axillary mass showing a poorly differentiated carcinoma, triple negative, with an MIB-1 of 90%.  (1) if PET scan is negative the patient will proceed to definitive surgery  (2) adjuvant chemotherapy will consist of cyclophosphamide and doxorubicin 4 given in dose dense fashion, followed by paclitaxel and carboplatin given weekly 12  (3) radiation will follow chemotherapy  (4) genetics counseling has been scheduled  PLAN: We spent the better part of today's hour-long appointment discussing the biology of breast cancer in general, and the specifics of the patient's tumor in particular. Chinita understands that, while having no apparent tumor in the breast and yet spread of the breast cancer to the armpit is uncommon, it is sufficiently, and to be well-recognized. We have specific guidelines on how to deal with these tumors.  Because her cancer was poorly differentiated and gross cystic disease fluid protein negative, we cannot be 100 percent sure that this is a breast primary although that is the most likely scenario. The first step is to show that there is no cancer elsewhere and to do that we are going to obtain a PET scan as soon as possible. I expect it to be negative and she understands that today's discussion is based on that assumption. If it turns out that there is a primary cancer elsewhere we will have to get back to step 1.  If this is indeed a triple negative breast cancer, then it is stage 2. She understands that  anti-estrogens and anti-HER-2 treatments will not be useful in her case and that the only available systemic treatment for triple negative breast cancer is chemotherapy.  Specifically in her case that would mean cyclophosphamide and doxorubicin in dose dense fashion 4 followed by weekly paclitaxel and carboplatin 12. She will need a port and an echocardiogram before starting chemotherapy. She will also benefit from coming to "chemotherapy school" to get more information regarding possible side effects, complications, and toxicities, although some of these were discussed today.  After chemotherapy she will proceed to radiation, which will include the breast as well as the axilla.  She will also benefit from genetics counseling and that will be scheduled in the near future.  Deniese has a good understanding of the overall plan. She agrees with it. She knows the goal of treatment in her case is cure. She will call with any problems that may develop before her next visit here, which will be mid February.Marland Kitchen  Chauncey Cruel, MD   11/28/2014 9:20 AM Medical Oncology and Hematology [facility] [address] [place], [place] [address] Tel. [phone]    Fax. [phone]

## 2014-11-25 ENCOUNTER — Other Ambulatory Visit (INDEPENDENT_AMBULATORY_CARE_PROVIDER_SITE_OTHER): Payer: Self-pay | Admitting: General Surgery

## 2014-11-25 NOTE — H&P (Signed)
Robin Franklin 11/24/2014 12:14 PM Location: [facility] Patient #: [id number] DOB: Apr 11, 1961 Undefined / Language: Suszanne Conners / Race: Undefined Female History of Present Illness Stark Klein MD; 11/25/2014 5:28 PM) The patient is a 54 year old female who presents with breast cancer. Patient is here at the request of Dr. Jule Ser for new diagnosis of breast cancer. Pt presents with a palpable mass in the left axilla. She brought this to the attention of her PCP and subsequently got a mammogram and ultrasound. She had no lesions found in either breast. She underwent biopsy and was found to have metastatic carcinoma consistent with breast cancer or possibly gyn. The mass was triple negative. she has not ever had a breast biopsy. She underwent bilateral breast reduction in the past. Her mother had breast cancer at age 23. She has no other family cancer history. She had menarche at age 12. She is postmenopausal with her last period 1-1/2 years ago. She has not ever used hormone replacement. She has had a colonoscopy, and she is up to date with her PAP smear.  Other Problems Erasmo Leventhal, RN, BSN; 11/24/2014 12:14 PM) Anxiety Disorder Arthritis Back Pain Chest pain Hypercholesterolemia Migraine Headache Thyroid Disease  Past Surgical History Erasmo Leventhal, RN, BSN; 11/24/2014 12:14 PM) Mammoplasty; Reduction Bilateral. Sentinel Lymph Node Biopsy Spinal Surgery - Lower Back  Diagnostic Studies History (Crystal Dollard, RN, BSN; 11/24/2014 12:14 PM) Colonoscopy 1-5 years ago Mammogram within last year Pap Smear 1-5 years ago  Social History (Erasmo Leventhal, RN, BSN; 11/24/2014 12:14 PM) Alcohol use Occasional alcohol use. No caffeine use No drug use Tobacco use Never smoker.  Family History (Erasmo Leventhal, RN, BSN; 11/24/2014 12:14 PM) Breast Cancer Mother. Heart Disease Father. Hypertension Mother, Sister.  Pregnancy / Birth History (Erasmo Leventhal, RN, BSN; 11/24/2014 12:14 PM) Age at menarche 37 years. Age of menopause 51-55 Contraceptive History Oral contraceptives. Gravida 2 Irregular periods Maternal age 67-25 Para 2     Review of Systems Occupational hygienist, BSN; 11/24/2014 12:14 PM) General Present- Chills and Weight Loss. Not Present- Appetite Loss, Fatigue, Fever, Night Sweats and Weight Gain. Skin Present- Dryness. Not Present- Change in Wart/Mole, Hives, Jaundice, New Lesions, Non-Healing Wounds, Rash and Ulcer. HEENT Present- Earache and Wears glasses/contact lenses. Not Present- Hearing Loss, Hoarseness, Nose Bleed, Oral Ulcers, Ringing in the Ears, Seasonal Allergies, Sinus Pain, Sore Throat, Visual Disturbances and Yellow Eyes. Respiratory Present- Snoring. Not Present- Bloody sputum, Chronic Cough, Difficulty Breathing and Wheezing. Breast Not Present- Breast Mass, Breast Pain, Nipple Discharge and Skin Changes. Cardiovascular Not Present- Chest Pain, Difficulty Breathing Lying Down, Leg Cramps, Palpitations, Rapid Heart Rate, Shortness of Breath and Swelling of Extremities. Gastrointestinal Present- Bloating, Difficulty Swallowing and Excessive gas. Not Present- Abdominal Pain, Bloody Stool, Change in Bowel Habits, Chronic diarrhea, Constipation, Gets full quickly at meals, Hemorrhoids, Indigestion, Nausea, Rectal Pain and Vomiting. Female Genitourinary Present- Frequency and Nocturia. Not Present- Painful Urination, Pelvic Pain and Urgency. Musculoskeletal Present- Back Pain, Joint Pain, Joint Stiffness, Muscle Pain and Muscle Weakness. Not Present- Swelling of Extremities. Neurological Present- Headaches and Numbness. Not Present- Decreased Memory, Fainting, Seizures, Tingling, Tremor, Trouble walking and Weakness. Psychiatric Present- Anxiety and Frequent crying. Not Present- Bipolar, Change in Sleep Pattern, Depression and Fearful. Endocrine Present- Hair Changes. Not Present- Cold Intolerance, Excessive  Hunger, Heat Intolerance, Hot flashes and New Diabetes. Hematology Not Present- Easy Bruising, Excessive bleeding, Gland problems, HIV and Persistent Infections.   Physical Exam Stark Klein MD; 11/25/2014 12:34 PM)  General Mental Status-Alert. General Appearance-Consistent with stated age. Hydration-Well hydrated. Voice-Normal.  Head and Neck Head-normocephalic, atraumatic with no lesions or palpable masses. Trachea-midline. Thyroid Gland Characteristics - normal size and consistency.  Eye Eyeball - Bilateral-Extraocular movements intact. Sclera/Conjunctiva - Bilateral-No scleral icterus.  Chest and Lung Exam Chest and lung exam reveals -quiet, even and easy respiratory effort with no use of accessory muscles and on auscultation, normal breath sounds, no adventitious sounds and normal vocal resonance. Inspection Chest Wall - Normal. Back - normal.  Cardiovascular Cardiovascular examination reveals -normal heart sounds, regular rate and rhythm with no murmurs and normal pedal pulses bilaterally.  Abdomen Inspection Inspection of the abdomen reveals - No Hernias. Palpation/Percussion Palpation and Percussion of the abdomen reveal - Soft, Non Tender, No Rebound tenderness, No Rigidity (guarding) and No hepatosplenomegaly. Auscultation Auscultation of the abdomen reveals - Bowel sounds normal.  Neurologic Neurologic evaluation reveals -alert and oriented x 3 with no impairment of recent or remote memory. Mental Status-Normal.  Musculoskeletal Global Assessment -Note:no gross deformities.  Normal Exam - Left-Upper Extremity Strength Normal and Lower Extremity Strength Normal. Normal Exam - Right-Upper Extremity Strength Normal and Lower Extremity Strength Normal.  Lymphatic Head & Neck  General Head & Neck Lymphatics: Bilateral - Description - Normal. Axillary  General Axillary Region: Left - Size - > 3.0 cm. Consistency - Hard. Shape -  Matted nodes. Overlying skin - Normal. Bilateral - Tenderness - Non Tender. Femoral & Inguinal  Generalized Femoral & Inguinal Lymphatics: Bilateral - Description - No Generalized lymphadenopathy.    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 She will get a PET scan to look for other potential sources of malignancy. If she appears to have a different type of cancer, we would hold off on surgery.  If this is negative, we will plan to do left axillary lymph node dissection and port a cath placement. This will be followed by chemotherapy and radiation. We discussed the surgery in terms of incision, location, risks, and post op recovery. I advised the patient that she will need a drain. I discussed the risk of bleeding, infection, damage to adjacent structures, numbness or tingling, nerve injury, seroma, heart or lung problems. We also discussed the risk of malfuction, malposition, or pneumothorax from the port a cath. The patient understands and wished to proceed.  60 min spent in evaluation, examination, counseling, and coordination of care. >50% spent in counseling.  Current Plans Schedule for Surgery Pt Education - ccs port insertion education

## 2014-11-26 ENCOUNTER — Encounter: Payer: Self-pay | Admitting: General Practice

## 2014-11-26 NOTE — Progress Notes (Signed)
[facility] Psychosocial Distress Screening Spiritual Care  Visited with Ms Herdt in breast clinic to introduce [facility] team/resources, and to review distress screen per protocol.  The patient declined to specify a number on the Psychosocial Distress Thermometer but indicated moderate distress. Also assessed for distress and other psychosocial needs.   ONCBCN DISTRESS SCREENING 11/26/2014  Screening Type Initial Screening  Practical problem type Work/school  Emotional problem type Nervousness/Anxiety  Information Concerns Type Lack of info about diagnosis;Lack of info about treatment;Lack of info about maintaining fitness  Physical Problem type Sleep/insomnia;Tingling hands/feet  Other Spiritual Care   Ms Santini indicates her top physical concerns are tingling in hands/feet and sleep, a problem she attributes to menopause.  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  Provided pastoral presence, reflective listening, and opportunity to share and process her unfolding cancer story and her feelings about it.  Also phoned her today (11/26/14) to check in and to offer birthday greetings (birthday is tomorrow).  She verbalized appreciation and named that she is "still in the waiting place" regarding more information about dx/POC and seeking a second opinion so that she can feel clear and confident in her choices.  Pt aware of ongoing chaplain availability and support resources, but please also page as needs arise.  Thank you.  Follow up needed: No.   [place], [place]

## 2014-11-28 DIAGNOSIS — C50612 Malignant neoplasm of axillary tail of left female breast: Secondary | ICD-10-CM | POA: Insufficient documentation

## 2014-11-28 DIAGNOSIS — Z171 Estrogen receptor negative status [ER-]: Secondary | ICD-10-CM | POA: Insufficient documentation

## 2014-11-29 ENCOUNTER — Telehealth: Payer: Self-pay | Admitting: Oncology

## 2014-11-29 ENCOUNTER — Telehealth: Payer: Self-pay | Admitting: *Deleted

## 2014-11-29 ENCOUNTER — Encounter: Payer: Self-pay | Admitting: *Deleted

## 2014-11-29 ENCOUNTER — Other Ambulatory Visit: Payer: Self-pay | Admitting: *Deleted

## 2014-11-29 DIAGNOSIS — R2232 Localized swelling, mass and lump, left upper limb: Secondary | ICD-10-CM

## 2014-11-29 NOTE — Telephone Encounter (Signed)
, °

## 2014-11-29 NOTE — Telephone Encounter (Signed)
Pt called to inform she was getting a second opinion at [facility] would like to cancel genetic testing at this time and will r/s at a later date. Pt denies questions or concerns regarding dx or treatment care plan. Informed pt that I would keep her chemo class and echo scheduled for now until she notified us of her decision. Discussed with pt that we were working on her PET scan being pre-cert and would schedule as soon as we had the authorization. Discussed that insurance will at times require different scanning prior to PET scan. Received verbal understanding. Encourage pt to call with further needs. Contact information given.

## 2014-11-29 NOTE — Telephone Encounter (Signed)
pe pof to sch pt appt-per Vaughan Basta no pre-auth req-sch ECHO-cld & gave pt appt-pt req to speak to [facility] in re to appts and 2nd opioion

## 2014-11-29 NOTE — Progress Notes (Signed)
Error

## 2014-11-30 ENCOUNTER — Telehealth: Payer: Self-pay | Admitting: Oncology

## 2014-11-30 NOTE — Telephone Encounter (Signed)
cld pt w/ECHO sch-pt stated going for 2nd opionion and WAS NOT COMING-contacted Dawn-Dawm spoke to pt and adv to leave appts as is

## 2014-12-02 ENCOUNTER — Encounter (HOSPITAL_COMMUNITY): Payer: Self-pay

## 2014-12-02 ENCOUNTER — Encounter (HOSPITAL_COMMUNITY): Payer: 59

## 2014-12-02 ENCOUNTER — Ambulatory Visit (HOSPITAL_COMMUNITY)
Admission: RE | Admit: 2014-12-02 | Discharge: 2014-12-02 | Disposition: A | Discharge status: Home / Self Care | Payer: 59 | Source: Ambulatory Visit | Attending: Oncology | Admitting: Oncology

## 2014-12-02 ENCOUNTER — Encounter (HOSPITAL_COMMUNITY)
Admission: RE | Admit: 2014-12-02 | Discharge: 2014-12-02 | Disposition: A | Discharge status: Home / Self Care | Payer: 59 | Source: Ambulatory Visit | Attending: Oncology | Admitting: Oncology

## 2014-12-02 ENCOUNTER — Other Ambulatory Visit: Payer: 59

## 2014-12-02 DIAGNOSIS — D259 Leiomyoma of uterus, unspecified: Secondary | ICD-10-CM | POA: Insufficient documentation

## 2014-12-02 DIAGNOSIS — C50919 Malignant neoplasm of unspecified site of unspecified female breast: Secondary | ICD-10-CM | POA: Diagnosis not present

## 2014-12-02 DIAGNOSIS — R2232 Localized swelling, mass and lump, left upper limb: Secondary | ICD-10-CM

## 2014-12-02 MED ORDER — TECHNETIUM TC 99M MEDRONATE IV KIT
25.0000 | PACK | Freq: Once | INTRAVENOUS | Status: AC | PRN
  Administered 2014-12-02: 25 via INTRAVENOUS

## 2014-12-02 MED ORDER — IOHEXOL 300 MG/ML  SOLN
100.0000 mL | Freq: Once | INTRAMUSCULAR | Status: AC | PRN
  Administered 2014-12-02: 100 mL via INTRAVENOUS

## 2014-12-06 ENCOUNTER — Other Ambulatory Visit: Payer: 59

## 2014-12-06 ENCOUNTER — Ambulatory Visit (HOSPITAL_COMMUNITY)
Admission: RE | Admit: 2014-12-06 | Discharge: 2014-12-06 | Disposition: A | Discharge status: Home / Self Care | Payer: 59 | Source: Ambulatory Visit | Attending: Oncology | Admitting: Oncology

## 2014-12-06 ENCOUNTER — Encounter: Payer: Self-pay | Admitting: Oncology

## 2014-12-06 ENCOUNTER — Telehealth: Payer: Self-pay | Admitting: *Deleted

## 2014-12-06 DIAGNOSIS — C50912 Malignant neoplasm of unspecified site of left female breast: Secondary | ICD-10-CM

## 2014-12-06 DIAGNOSIS — I379 Nonrheumatic pulmonary valve disorder, unspecified: Secondary | ICD-10-CM

## 2014-12-06 DIAGNOSIS — R229 Localized swelling, mass and lump, unspecified: Secondary | ICD-10-CM | POA: Insufficient documentation

## 2014-12-06 DIAGNOSIS — R2232 Localized swelling, mass and lump, left upper limb: Secondary | ICD-10-CM

## 2014-12-06 NOTE — Progress Notes (Signed)
No date for treatment as of today. °

## 2014-12-06 NOTE — Progress Notes (Signed)
  Echocardiogram 2D Echocardiogram has been performed.  Robin Franklin 12/06/2014, 12:24 PM

## 2014-12-06 NOTE — Telephone Encounter (Signed)
Spoke to pt concerning her decision on treatment facility. Pt has decided to have surgery with Dr. Fransisca Connors McNatt at [facility] and chemo/xrt at [facility]. I have informed her physician team of her decision. Confirmed future appts for chemo class and echo as well as f/u with Dr. Jana Hakim. She is scheduled for surgery on 12/23/14 at [facility].  Pt denies further needs at this time.

## 2014-12-09 ENCOUNTER — Telehealth: Payer: Self-pay | Admitting: *Deleted

## 2014-12-09 NOTE — Telephone Encounter (Signed)
Pt called to inquire about FMLA paperwork and having eyebrows tattooed on. Robin Franklin has the completed FMLA forms and faxing them to pt place of work- pt informed of this. Informed pt that per Dr. Lindi Adie we do not recommend any form of tattooing or permanent makeup prior to chemotherapy. Received verbal understanding. Pt denies further needs at this time.

## 2014-12-13 ENCOUNTER — Encounter: Payer: Self-pay | Admitting: Oncology

## 2014-12-13 NOTE — Progress Notes (Signed)
Faxed disability form to [employer] disability and leave center @ [phone]

## 2014-12-14 ENCOUNTER — Telehealth: Payer: Self-pay | Admitting: *Deleted

## 2014-12-14 NOTE — Telephone Encounter (Signed)
Call received from Leane Para, Nurse Clinical Consultant requesting records and to speak with someone about patient status.  Claim number [id number].  Leane Para can be called at [phone].  Will notify [employer].

## 2014-12-17 ENCOUNTER — Encounter: Payer: Self-pay | Admitting: Oncology

## 2014-12-17 NOTE — Progress Notes (Signed)
Put [employer] cancer form on nurse's desk

## 2014-12-23 ENCOUNTER — Encounter: Payer: Self-pay | Admitting: Oncology

## 2014-12-23 NOTE — Progress Notes (Signed)
Faxed cancer form to [employer] @ [phone]

## 2014-12-31 ENCOUNTER — Ambulatory Visit (HOSPITAL_BASED_OUTPATIENT_CLINIC_OR_DEPARTMENT_OTHER): Payer: 59 | Admitting: Oncology

## 2014-12-31 ENCOUNTER — Telehealth: Payer: Self-pay | Admitting: Oncology

## 2014-12-31 ENCOUNTER — Telehealth: Payer: Self-pay | Admitting: *Deleted

## 2014-12-31 DIAGNOSIS — C773 Secondary and unspecified malignant neoplasm of axilla and upper limb lymph nodes: Secondary | ICD-10-CM

## 2014-12-31 DIAGNOSIS — C50912 Malignant neoplasm of unspecified site of left female breast: Secondary | ICD-10-CM

## 2014-12-31 DIAGNOSIS — R2232 Localized swelling, mass and lump, left upper limb: Secondary | ICD-10-CM

## 2014-12-31 DIAGNOSIS — C801 Malignant (primary) neoplasm, unspecified: Secondary | ICD-10-CM

## 2014-12-31 MED ORDER — LORAZEPAM 0.5 MG PO TABS: 0.5000 mg | ORAL_TABLET | Freq: Every evening | ORAL | Status: DC | PRN

## 2014-12-31 MED ORDER — DEXAMETHASONE 4 MG PO TABS: ORAL_TABLET | ORAL | Status: DC

## 2014-12-31 MED ORDER — LIDOCAINE-PRILOCAINE 2.5-2.5 % EX CREA: TOPICAL_CREAM | CUTANEOUS | Status: DC

## 2014-12-31 MED ORDER — PROCHLORPERAZINE MALEATE 10 MG PO TABS: 10.0000 mg | ORAL_TABLET | Freq: Four times a day (QID) | ORAL | Status: DC | PRN

## 2014-12-31 MED ORDER — ONDANSETRON HCL 8 MG PO TABS: 8.0000 mg | ORAL_TABLET | Freq: Two times a day (BID) | ORAL | Status: DC | PRN

## 2014-12-31 NOTE — Progress Notes (Signed)
Robin Franklin  [phone] Fax:([phone]     ID: Robin Franklin DOB: 1961-08-31  MR#: [id number]  YWV#:[id number]  Patient Care Team: Redmond School, MD as PCP - General (Internal Medicine) Stark Klein, MD as Consulting Physician (General Surgery) Chauncey Cruel, MD as Consulting Physician (Oncology) Rexene Edison, MD as Consulting Physician (Radiation Oncology) Vania Rea, MD as Consulting Physician (Obstetrics and Gynecology) Newman Pies, MD as Consulting Physician (Neurosurgery) Angelina Ok, MD as Referring Physician (Surgery) OTHER MD:  CHIEF COMPLAINT: Triple negative breast cancer   CURRENT TREATMENT: Adjuvant chemotherapy  BREAST CANCER HISTORY: From the original intake note:  Najah herself noted a mass in her left axilla sometime in late November or she brought it to Dr. Nolon Rod attention and on 11/15/2014 she had a left mammogram and left ultrasound at the breast Center. The breast density was category 8. The left breast was entirely unremarkable, with no masses, distortion, or calcifications. In the left axilla there was a mass which was palpable and moderately firm. By ultrasound it measured 3.2 cm. There were no other axillary masses of concern.  Biopsy of the left axillary mass the same day showed ([id number]) a high-grade carcinoma which was estrogen and progesterone receptor negative, HER-2 negative, with a signals ratio of 1.53 and the number per cell of 2.60, with an MIB-1 of 95%. It was also gross cystic disease fluid protein negative, and negative for cytokeratin 20 and TTF-1. It was positive for cytokeratin 7. This was felt to be consistent with a breast primary, but gynecologic and other malignancies were also in the differential.  In general 04/08/2015 the patient had a diagnostic right mammogram, which was negative. On the same day she had bilateral breast MRI. There were no masses or abnormal enhancement in either  breast. In the left axilla there was a round enhancing mass measuring 4.6 cm. There were mildly prominent smaller immediately adjacent lymph nodes. He was no right axillary or internal mammary adenopathy. An incidental lipoma was noted in the left lateral chest wall.  Her subsequent history is as detailed below.  INTERVAL HISTORY:  Robin Franklin returns today to the breast clinic for follow-up of her triple negative breast cancer accompanied by her husband Robin Franklin and her sister. Since the last visit here she had staging studies including CT scans of the chest abdomen and pelvis and a bone scan showing no evidence of metastatic disease, the only area of uptake being in the left axilla. She then underwent left axillary lymph node dissection under Dr. Elisha Headland 12/23/2014. Only 1 of 14 lymph nodes removed was positive for carcinoma, with extracapsular extension. The nodule measured 4.0 cm. Robin Franklin did well with her surgery, still with some residual soreness but no significant pain, bleeding, or fever problems. She still has a drain in place. She is here today specifically to discuss adjuvant treatment.  REVIEW OF SYSTEMS: She has some aches and pains here in there which are not more intense or persistent than before. She has mild to moderate hot flashes. A detailed review of systems today was otherwise noncontributory  PAST MEDICAL HISTORY: Past Medical History  Diagnosis Date  . Hypothyroidism   . Headache(784.0)   . Anxiety   . Breast cancer   . Arthritis   . Joint pain     PAST SURGICAL HISTORY: Past Surgical History  Procedure Laterality Date  . Breast reduction surgery    . Cervical cone biopsy    . Cyst removed from neck    .  Colonoscopy  11/06/2011    Procedure: COLONOSCOPY;  Surgeon: Arlyce Harman, MD;  Location: [facility];  Service: Endoscopy;  Laterality: N/A;  11:30 AM  . Steriod injection      FAMILY HISTORY Family History  Problem Relation Age of Onset  . Colon cancer Neg Hx     . Breast cancer Mother   the patient's father died from a myocardial infarction at the age of 1. [name] died from breast cancer at the age of 62. It was diagnosed at age 19. The patient has 2 brothers, one sister. There is no other history of breast or ovarian cancer in the family to her knowledge.  GYNECOLOGIC HISTORY:  Patient's last menstrual period was 07/20/2013 (approximate). Menarche age 57, first live birth age 58. The patient is GX P2. She used oral contraceptives for a few months remotely with no side effects. She stopped having periods in September 2014. 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 Her husband Robin Franklin is a Pensions consultant with [employer]. At home it's just the 2 of them, with no pets. Robin Franklin's children from an earlier marriage are Robin Franklin. Robin Franklin, who lives in [place] and is a International aid/development worker; and Robin Franklin, who lives in [place] and is a Company secretary. The patient has 4 grandchildren +2 by adoption. She attends a [employer] locally    ADVANCED DIRECTIVES: Not in place   HEALTH MAINTENANCE: History  Substance Use Topics  . Smoking status: Never Smoker   . Smokeless tobacco: Not on file  . Alcohol Use: No     Colonoscopy: 2013/ [facility]  PAP: February 2015  Bone density: Never  Lipid panel:  No Known Allergies  Current Outpatient Prescriptions  Medication Sig Dispense Refill  . ALPRAZolam (XANAX) 0.5 MG tablet Take 0.5 mg by mouth daily as needed for anxiety. For anxiety    . Cyanocobalamin (VITAMIN B 12 PO) Take 1 tablet by mouth daily.    Marland Kitchen dexamethasone (DECADRON) 4 MG tablet Take 2 tablets by mouth once a day on the day after chemotherapy and then take 2 tablets two times a day for 2 days. Take with food. 30 tablet 1  . ibuprofen (ADVIL,MOTRIN) 200 MG tablet Take 400 mg by mouth every 6 (six) hours as needed for moderate pain.    Marland Kitchen levothyroxine  (SYNTHROID, LEVOTHROID) 75 MCG tablet Take 75 mcg by mouth daily before breakfast.    . lidocaine-prilocaine (EMLA) cream Apply to affected area once 30 g 3  . LORazepam (ATIVAN) 0.5 MG tablet Take 1 tablet (0.5 mg total) by mouth at bedtime as needed (Nausea or vomiting). 30 tablet 0  . Melatonin 3 MG TABS Take 3 mg by mouth at bedtime as needed (Sleep).    . Multiple Vitamin (MULTIVITAMIN WITH MINERALS) TABS tablet Take 1 tablet by mouth daily.    . Omega-3 Fatty Acids (FISH OIL) 1000 MG CAPS Take 1 capsule by mouth daily.    . ondansetron (ZOFRAN) 8 MG tablet Take 1 tablet (8 mg total) by mouth 2 (two) times daily as needed. Start on the third day after chemotherapy. 30 tablet 1  . prochlorperazine (COMPAZINE) 10 MG tablet Take 1 tablet (10 mg total) by mouth every 6 (six) hours as needed (Nausea or vomiting). 30 tablet 1  . UNABLE TO FIND Apply topically 4 (four) times daily. Keto%/Bac2%/Gab5%/Lido5%     No current facility-administered medications for  this visit.    OBJECTIVE: Middle-aged African-American woman in no acute distress Filed Vitals:   12/31/14 1457  BP: 117/71  Pulse: 100  Temp: 98 F (36.7 C)  Resp: 18     Body mass index is 27.11 kg/(m^2).    ECOG FS:1 - Symptomatic but completely ambulatory  Sclerae unicteric, pupils round and equal Oropharynx clear and moist No cervical or supraclavicular adenopathy Lungs no rales or rhonchi Heart regular rate and rhythm Abd soft, nontender, positive bowel sounds MSK no focal spinal tenderness, no upper extremity lymphedema, somewhat restricted range of motion left upper extremity Neuro: nonfocal, well oriented, positive affect Breasts: The right breast is unremarkable. The left breast shows no skin or nipple changes of concern. There are no palpable masses. The left axilla status post recent surgery with Steri-Strips still in place. There is a Jackson-Pratt drain with approximately 30 mL serous sanguinous fluid in the  bulb    LAB RESULTS:  CMP     Component Value Date/Time   NA 144 11/24/2014 1248   NA 142 01/13/2014 1928   K 4.1 11/24/2014 1248   K 3.5* 01/13/2014 1928   CL 103 01/13/2014 1928   CO2 29 11/24/2014 1248   CO2 25 12/23/2010 1229   GLUCOSE 79 11/24/2014 1248   GLUCOSE 82 01/13/2014 1928   BUN 19.0 11/24/2014 1248   BUN 14 01/13/2014 1928   CREATININE 1.0 11/24/2014 1248   CREATININE 1.00 01/13/2014 1928   CALCIUM 9.9 11/24/2014 1248   CALCIUM 9.1 12/23/2010 1229   PROT 7.6 11/24/2014 1248   ALBUMIN 4.3 11/24/2014 1248   AST 14 11/24/2014 1248   ALT 12 11/24/2014 1248   ALKPHOS 67 11/24/2014 1248   BILITOT 0.36 11/24/2014 1248   GFRNONAA >60 12/23/2010 1229   GFRAA  12/23/2010 1229    >60        The eGFR has been calculated using the MDRD equation. This calculation has not been validated in all clinical situations. eGFR's persistently <60 mL/min signify possible Chronic Kidney Disease.    INo results found for: SPEP, UPEP  Lab Results  Component Value Date   WBC 5.2 11/24/2014   NEUTROABS 2.5 11/24/2014   HGB 12.8 11/24/2014   HCT 40.0 11/24/2014   MCV 83.3 11/24/2014   PLT 251 11/24/2014      Chemistry      Component Value Date/Time   NA 144 11/24/2014 1248   NA 142 01/13/2014 1928   K 4.1 11/24/2014 1248   K 3.5* 01/13/2014 1928   CL 103 01/13/2014 1928   CO2 29 11/24/2014 1248   CO2 25 12/23/2010 1229   BUN 19.0 11/24/2014 1248   BUN 14 01/13/2014 1928   CREATININE 1.0 11/24/2014 1248   CREATININE 1.00 01/13/2014 1928      Component Value Date/Time   CALCIUM 9.9 11/24/2014 1248   CALCIUM 9.1 12/23/2010 1229   ALKPHOS 67 11/24/2014 1248   AST 14 11/24/2014 1248   ALT 12 11/24/2014 1248   BILITOT 0.36 11/24/2014 1248       No results found for: LABCA2  No components found for: LABCA125  No results for input(s): INR in the last 168 hours.  Urinalysis    Component Value Date/Time   COLORURINE YELLOW 01/13/2014 1924    APPEARANCEUR CLEAR 01/13/2014 1924   LABSPEC >1.030* 01/13/2014 1924   PHURINE 5.5 01/13/2014 1924   GLUCOSEU NEGATIVE 01/13/2014 1924   HGBUR NEGATIVE 01/13/2014 [place] NEGATIVE 01/13/2014 1924  KETONESUR NEGATIVE 01/13/2014 1924   PROTEINUR NEGATIVE 01/13/2014 1924   UROBILINOGEN 0.2 01/13/2014 1924   NITRITE NEGATIVE 01/13/2014 1924   LEUKOCYTESUR NEGATIVE 01/13/2014 1924    STUDIES: Ct Chest W Contrast  12/02/2014   CLINICAL DATA:  Left axillary mass, most likely breast cancer but no breast masses on MRI. Evaluate for primary lesion or other metastatic disease.  EXAM: CT CHEST, ABDOMEN, AND PELVIS WITH CONTRAST  TECHNIQUE: Multidetector CT imaging of the chest, abdomen and pelvis was performed following the standard protocol during bolus administration of intravenous contrast.  CONTRAST:  19mL OMNIPAQUE IOHEXOL 300 MG/ML  SOLN  COMPARISON:  Breast MRI 11/23/2014  FINDINGS: CT CHEST FINDINGS  Chest wall: There is a 4 cm soft tissue mass in the left axilla correlating with the patient's palpable abnormality and recently biopsied lesion. There are a few small scattered lymph nodes surrounding the lesion and a few small subpectoral lymph nodes. No breast masses identified. There are few benign-appearing right axillary lymph nodes. No supraclavicular adenopathy. The thyroid gland is grossly normal. The bony thorax is intact. No destructive bone lesions or spinal canal compromise. No sternal lesions.There is a small subcutaneous lesion involving the posterior lateral chest wall on the left overlying the region of the left latissimus dorsi muscle. This could be a benign sebaceous cyst. I do not see any other subcutaneous lesions.  Mediastinum: The heart is normal in size. No pericardial effusion. No mediastinal or hilar mass or adenopathy. Minimal residual thymic tissue is noted in the anterior mediastinum. The aorta is normal in caliber. No dissection. The esophagus is grossly normal.   Lungs: No worrisome pulmonary lesions to suggest pulmonary metastatic disease. No bronchiectasis or interstitial lung disease. No pleural lesions.  CT ABDOMEN AND PELVIS FINDINGS  Hepatobiliary: No hepatic lesions to suggest hepatic metastatic disease. No intrahepatic biliary dilatation. The gallbladder is normal. No common bile duct dilatation.  Pancreas: No inflammatory changes or mass lesion. The pancreatic duct is normal.  Spleen: Normal size.  No focal lesions.  Adrenals/Urinary Tract: The adrenal glands are normal. Both kidneys are normal.  Stomach/Bowel: The stomach, duodenum, small bowel and colon are unremarkable. No inflammatory changes, mass lesions or obstructive findings. The terminal ileum is normal. The appendix is normal. Moderate to large amount of stool throughout the colon may suggest constipation.  Vascular/Lymphatic: No mesenteric or retroperitoneal mass or adenopathy. Small scattered lymph nodes are noted. The aorta and branch vessels are normal.  Reproductive: Enlarged fibroid uterus. Degenerated fibroid noted in the right myometrium. Large exophytic fibroid noted posteriorly with calcifications. The endometrium appears normal. The ovaries appear normal. The bladder is unremarkable. No pelvic mass or adenopathy. There is a small amount of free pelvic fluid. No inguinal mass or adenopathy.  Other: No abdominal hernia or subcutaneous lesions.  Musculoskeletal: No significant bony findings. No lytic or sclerotic bone lesions to suggest metastatic disease.  IMPRESSION: 1. 4 cm left axillary mass with small surrounding axillary and subpectoral lymph nodes. 2. No mediastinal or hilar mass or adenopathy and no worrisome pulmonary lesions. 3. No abdominal/pelvic mass or adenopathy. 4. Fibroid uterus. 5. Moderate to large amount of stool throughout the colon may suggest constipation. 6. No worrisome bone lesions.   Electronically Signed   By: Kalman Jewels M.D.   On: 12/02/2014 14:54   Nm Bone Scan  Whole Body  12/02/2014   CLINICAL DATA:  Breast cancer, metastatic disease, LEFT axillary mass  EXAM: NUCLEAR MEDICINE WHOLE BODY BONE SCAN  TECHNIQUE: Whole  body anterior and posterior images were obtained approximately 3 hours after intravenous injection of radiopharmaceutical.  RADIOPHARMACEUTICALS:  25 mCi Technetium-35mMDP IV  COMPARISON:  None  Radiographic correlation:  CT chest abdomen and pelvis 12/02/2014  FINDINGS: Uptake at BILATERAL knees, sternoclavicular joints, and AC joints typically degenerative.  No abnormal foci of osseous tracer accumulation identified to suggest osseous metastatic disease.  Expected urinary tract and soft tissue distribution of tracer.  IMPRESSION: Normal bone scan.   Electronically Signed   By: MLavonia DanaM.D.   On: 12/02/2014 17:12   Ct Abdomen Pelvis W Contrast  12/02/2014   CLINICAL DATA:  Left axillary mass, most likely breast cancer but no breast masses on MRI. Evaluate for primary lesion or other metastatic disease.  EXAM: CT CHEST, ABDOMEN, AND PELVIS WITH CONTRAST  TECHNIQUE: Multidetector CT imaging of the chest, abdomen and pelvis was performed following the standard protocol during bolus administration of intravenous contrast.  CONTRAST:  1029mOMNIPAQUE IOHEXOL 300 MG/ML  SOLN  COMPARISON:  Breast MRI 11/23/2014  FINDINGS: CT CHEST FINDINGS  Chest wall: There is a 4 cm soft tissue mass in the left axilla correlating with the patient's palpable abnormality and recently biopsied lesion. There are a few small scattered lymph nodes surrounding the lesion and a few small subpectoral lymph nodes. No breast masses identified. There are few benign-appearing right axillary lymph nodes. No supraclavicular adenopathy. The thyroid gland is grossly normal. The bony thorax is intact. No destructive bone lesions or spinal canal compromise. No sternal lesions.There is a small subcutaneous lesion involving the posterior lateral chest wall on the left overlying the region of  the left latissimus dorsi muscle. This could be a benign sebaceous cyst. I do not see any other subcutaneous lesions.  Mediastinum: The heart is normal in size. No pericardial effusion. No mediastinal or hilar mass or adenopathy. Minimal residual thymic tissue is noted in the anterior mediastinum. The aorta is normal in caliber. No dissection. The esophagus is grossly normal.  Lungs: No worrisome pulmonary lesions to suggest pulmonary metastatic disease. No bronchiectasis or interstitial lung disease. No pleural lesions.  CT ABDOMEN AND PELVIS FINDINGS  Hepatobiliary: No hepatic lesions to suggest hepatic metastatic disease. No intrahepatic biliary dilatation. The gallbladder is normal. No common bile duct dilatation.  Pancreas: No inflammatory changes or mass lesion. The pancreatic duct is normal.  Spleen: Normal size.  No focal lesions.  Adrenals/Urinary Tract: The adrenal glands are normal. Both kidneys are normal.  Stomach/Bowel: The stomach, duodenum, small bowel and colon are unremarkable. No inflammatory changes, mass lesions or obstructive findings. The terminal ileum is normal. The appendix is normal. Moderate to large amount of stool throughout the colon may suggest constipation.  Vascular/Lymphatic: No mesenteric or retroperitoneal mass or adenopathy. Small scattered lymph nodes are noted. The aorta and branch vessels are normal.  Reproductive: Enlarged fibroid uterus. Degenerated fibroid noted in the right myometrium. Large exophytic fibroid noted posteriorly with calcifications. The endometrium appears normal. The ovaries appear normal. The bladder is unremarkable. No pelvic mass or adenopathy. There is a small amount of free pelvic fluid. No inguinal mass or adenopathy.  Other: No abdominal hernia or subcutaneous lesions.  Musculoskeletal: No significant bony findings. No lytic or sclerotic bone lesions to suggest metastatic disease.  IMPRESSION: 1. 4 cm left axillary mass with small surrounding  axillary and subpectoral lymph nodes. 2. No mediastinal or hilar mass or adenopathy and no worrisome pulmonary lesions. 3. No abdominal/pelvic mass or adenopathy. 4. Fibroid uterus.  5. Moderate to large amount of stool throughout the colon may suggest constipation. 6. No worrisome bone lesions.   Electronically Signed   By: Kalman Jewels M.D.   On: 12/02/2014 14:54    ASSESSMENT: 54 y.o. Robin Franklin woman status post biopsy 11/15/2014 of a 4.6 cm left axillary mass showing a poorly differentiated carcinoma, triple negative, with an MIB-1 of 90%.  (1) s/p left axillary lymph node dissection under Dr.Howard-McNatt 12/23/2014 with 1 of 14 lymph nodes positive; TX N1, stage II; repeat prognostic panel pending  (2) adjuvant chemotherapy to start 01/17/2015, consisting of cyclophosphamide and doxorubicin 4 given in dose dense fashion, followed by paclitaxel and carboplatin given weekly 12  (a) patient requests to have Neulasta administered at [facility]  (3) radiation will follow chemotherapy  (4) genetics counseling has been scheduled  PLAN: Amarisa did well with her surgery and hopefully will be able to have her drain removed sometime next week. We like to start chemotherapy for triple negative tumors within a month of surgery, so her first of treatment will be February 29. Specifically she will receive doxorubicin and cyclophosphamide in dose dense fashion with Neulasta support. She will receive the Neulasta dose in [place].  Today we discussed the possible toxicities, side effects and complications of the upcoming chemotherapy. All prescriptions were entered and she was given a "roadmap" on how to take them. The chemotherapy orders were also entered as well as all the follow-up appointments through the completion of this first part of chemotherapy. She will see me again before starting part 2, which will consist of weekly carboplatin and paclitaxel.  Bob is interested in participating in  our "look good feel better" program every second Monday, and we have scheduled her chemotherapy and follow-up treatments to make this possible.  She has a good understanding of the overall plan. She agrees with it. She knows the goal of treatment in her case is cure. She will call with any problems that may develop before her next visit here, which will be mid February.Marland Kitchen  Chauncey Cruel, MD   12/31/2014 4:00 PM Medical Oncology and Hematology [facility] [address] [place], [place] [address] Tel. [phone]    Fax. [phone]

## 2014-12-31 NOTE — Telephone Encounter (Signed)
, °

## 2014-12-31 NOTE — Telephone Encounter (Signed)
This RN called to [facility] pathology department per MD request to follow up on pathology on lymph node dissection performed under Dr Elisha Headland on 12/23/2014.  Per path department- prognostic panel not performed on this tissue sample.  Pathologist Dr Reinaldo Meeker will pull path and perform prognostic panel and call MD on Monday 2/15.  This RN gave MD's direct cell number for contact as well as this RN's direct nurse phone number.  Note number for [facility] path department is [phone].  MD made aware of the above.

## 2015-01-03 ENCOUNTER — Telehealth: Payer: Self-pay | Admitting: *Deleted

## 2015-01-03 NOTE — Telephone Encounter (Signed)
Per staff message and POF I have scheduled appts. Advised scheduler of appts. JMW  

## 2015-01-04 ENCOUNTER — Encounter: Payer: Self-pay | Admitting: Oncology

## 2015-01-04 NOTE — Progress Notes (Signed)
Faxed surgical info to [facility] @ [phone] to extend patients disability

## 2015-01-14 ENCOUNTER — Telehealth: Payer: Self-pay | Admitting: Oncology

## 2015-01-14 NOTE — Telephone Encounter (Signed)
pt cld in re to appt from [facility]-adv she would have to call [facility] to discuss anything they sch-I could assisit w/appts sch w/[facility]-gave pt next appt time & date

## 2015-01-17 ENCOUNTER — Other Ambulatory Visit (HOSPITAL_BASED_OUTPATIENT_CLINIC_OR_DEPARTMENT_OTHER): Payer: 59

## 2015-01-17 ENCOUNTER — Encounter: Payer: Self-pay | Admitting: Adult Health

## 2015-01-17 ENCOUNTER — Encounter: Payer: Self-pay | Admitting: *Deleted

## 2015-01-17 ENCOUNTER — Other Ambulatory Visit: Payer: Self-pay | Admitting: Oncology

## 2015-01-17 ENCOUNTER — Ambulatory Visit (HOSPITAL_BASED_OUTPATIENT_CLINIC_OR_DEPARTMENT_OTHER): Payer: 59 | Admitting: Nurse Practitioner

## 2015-01-17 ENCOUNTER — Ambulatory Visit (HOSPITAL_BASED_OUTPATIENT_CLINIC_OR_DEPARTMENT_OTHER): Payer: 59

## 2015-01-17 ENCOUNTER — Encounter: Payer: Self-pay | Admitting: Nurse Practitioner

## 2015-01-17 VITALS — BP 130/74 | HR 78 | Temp 98.5°F | Resp 18 | Ht 67.0 in | Wt 172.2 lb

## 2015-01-17 DIAGNOSIS — C50912 Malignant neoplasm of unspecified site of left female breast: Secondary | ICD-10-CM

## 2015-01-17 DIAGNOSIS — E039 Hypothyroidism, unspecified: Secondary | ICD-10-CM

## 2015-01-17 DIAGNOSIS — C773 Secondary and unspecified malignant neoplasm of axilla and upper limb lymph nodes: Secondary | ICD-10-CM

## 2015-01-17 DIAGNOSIS — Z5111 Encounter for antineoplastic chemotherapy: Secondary | ICD-10-CM

## 2015-01-17 DIAGNOSIS — R2232 Localized swelling, mass and lump, left upper limb: Secondary | ICD-10-CM

## 2015-01-17 DIAGNOSIS — Z5189 Encounter for other specified aftercare: Secondary | ICD-10-CM

## 2015-01-17 DIAGNOSIS — Z171 Estrogen receptor negative status [ER-]: Secondary | ICD-10-CM

## 2015-01-17 LAB — CBC WITH DIFFERENTIAL/PLATELET
BASO%: 1.2 % (ref 0.0–2.0)
Basophils Absolute: 0.1 10*3/uL (ref 0.0–0.1)
EOS%: 1 % (ref 0.0–7.0)
Eosinophils Absolute: 0.1 10*3/uL (ref 0.0–0.5)
HCT: 39.1 % (ref 34.8–46.6)
HGB: 12.8 g/dL (ref 11.6–15.9)
LYMPH#: 1.7 10*3/uL (ref 0.9–3.3)
LYMPH%: 29.7 % (ref 14.0–49.7)
MCH: 27.3 pg (ref 25.1–34.0)
MCHC: 32.8 g/dL (ref 31.5–36.0)
MCV: 83.2 fL (ref 79.5–101.0)
MONO#: 0.4 10*3/uL (ref 0.1–0.9)
MONO%: 6.7 % (ref 0.0–14.0)
NEUT%: 61.4 % (ref 38.4–76.8)
NEUTROS ABS: 3.5 10*3/uL (ref 1.5–6.5)
Platelets: 281 10*3/uL (ref 145–400)
RBC: 4.69 10*6/uL (ref 3.70–5.45)
RDW: 12.9 % (ref 11.2–14.5)
WBC: 5.6 10*3/uL (ref 3.9–10.3)

## 2015-01-17 LAB — COMPREHENSIVE METABOLIC PANEL (CC13)
ALT: 15 U/L (ref 0–55)
AST: 15 U/L (ref 5–34)
Albumin: 4.1 g/dL (ref 3.5–5.0)
Alkaline Phosphatase: 63 U/L (ref 40–150)
Anion Gap: 10 mEq/L (ref 3–11)
BILIRUBIN TOTAL: 0.34 mg/dL (ref 0.20–1.20)
BUN: 15.2 mg/dL (ref 7.0–26.0)
CO2: 27 meq/L (ref 22–29)
Calcium: 9.5 mg/dL (ref 8.4–10.4)
Chloride: 106 mEq/L (ref 98–109)
Creatinine: 0.8 mg/dL (ref 0.6–1.1)
GLUCOSE: 86 mg/dL (ref 70–140)
POTASSIUM: 4 meq/L (ref 3.5–5.1)
Sodium: 143 mEq/L (ref 136–145)
TOTAL PROTEIN: 7.6 g/dL (ref 6.4–8.3)

## 2015-01-17 MED ORDER — PEGFILGRASTIM 6 MG/0.6ML ~~LOC~~ PSKT
6.0000 mg | PREFILLED_SYRINGE | Freq: Once | SUBCUTANEOUS | Status: AC
  Administered 2015-01-17: 6 mg via SUBCUTANEOUS
  Filled 2015-01-17: qty 0.6

## 2015-01-17 MED ORDER — HYDROCODONE-ACETAMINOPHEN 5-325 MG PO TABS
1.0000 | ORAL_TABLET | Freq: Once | ORAL | Status: AC
  Administered 2015-01-17: 1 via ORAL

## 2015-01-17 MED ORDER — HYDROCODONE-ACETAMINOPHEN 5-325 MG PO TABS
ORAL_TABLET | ORAL | Status: AC
  Filled 2015-01-17: qty 1

## 2015-01-17 MED ORDER — DEXAMETHASONE SODIUM PHOSPHATE 20 MG/5ML IJ SOLN
12.0000 mg | Freq: Once | INTRAMUSCULAR | Status: AC
  Administered 2015-01-17: 12 mg via INTRAVENOUS

## 2015-01-17 MED ORDER — SODIUM CHLORIDE 0.9 % IV SOLN
600.0000 mg/m2 | Freq: Once | INTRAVENOUS | Status: AC
  Administered 2015-01-17: 1160 mg via INTRAVENOUS
  Filled 2015-01-17: qty 58

## 2015-01-17 MED ORDER — HEPARIN SOD (PORK) LOCK FLUSH 100 UNIT/ML IV SOLN
500.0000 [IU] | Freq: Once | INTRAVENOUS | Status: AC | PRN
  Administered 2015-01-17: 500 [IU]
  Filled 2015-01-17: qty 5

## 2015-01-17 MED ORDER — SODIUM CHLORIDE 0.9 % IJ SOLN
10.0000 mL | INTRAMUSCULAR | Status: DC | PRN
  Administered 2015-01-17: 10 mL
  Filled 2015-01-17: qty 10

## 2015-01-17 MED ORDER — PALONOSETRON HCL INJECTION 0.25 MG/5ML
INTRAVENOUS | Status: AC
  Filled 2015-01-17: qty 5

## 2015-01-17 MED ORDER — DOXORUBICIN HCL CHEMO IV INJECTION 2 MG/ML
60.0000 mg/m2 | Freq: Once | INTRAVENOUS | Status: AC
  Administered 2015-01-17: 116 mg via INTRAVENOUS
  Filled 2015-01-17: qty 58

## 2015-01-17 MED ORDER — DEXAMETHASONE SODIUM PHOSPHATE 20 MG/5ML IJ SOLN
INTRAMUSCULAR | Status: AC
  Filled 2015-01-17: qty 5

## 2015-01-17 MED ORDER — SODIUM CHLORIDE 0.9 % IV SOLN
150.0000 mg | Freq: Once | INTRAVENOUS | Status: AC
  Administered 2015-01-17: 150 mg via INTRAVENOUS
  Filled 2015-01-17: qty 5

## 2015-01-17 MED ORDER — SODIUM CHLORIDE 0.9 % IV SOLN
Freq: Once | INTRAVENOUS | Status: AC
  Administered 2015-01-17: 12:00:00 via INTRAVENOUS

## 2015-01-17 MED ORDER — PALONOSETRON HCL INJECTION 0.25 MG/5ML
0.2500 mg | Freq: Once | INTRAVENOUS | Status: AC
  Administered 2015-01-17: 0.25 mg via INTRAVENOUS

## 2015-01-17 NOTE — Progress Notes (Signed)
[place]  [phone] [phone]     ID: [name] DOB: 05/25/1961  MR#: [id number]  CWU#:[id number]  Patient Care Team: Redmond School, MD as PCP - General (Internal Medicine) Stark Klein, MD as Consulting Physician (General Surgery) Chauncey Cruel, MD as Consulting Physician (Oncology) Rexene Edison, MD as Consulting Physician (Radiation Oncology) Vania Rea, MD as Consulting Physician (Obstetrics and Gynecology) Newman Pies, MD as Consulting Physician (Neurosurgery) Angelina Ok, MD as Referring Physician (Surgery) OTHER MD:  CHIEF COMPLAINT: Triple negative breast cancer   CURRENT TREATMENT: Adjuvant chemotherapy  BREAST CANCER HISTORY: From the original intake note:  Maripat herself noted a mass in her left axilla sometime in late November or she brought it to Dr. Nolon Rod attention and on 11/15/2014 she had a left mammogram and left ultrasound at the breast Center. The breast density was category 8. The left breast was entirely unremarkable, with no masses, distortion, or calcifications. In the left axilla there was a mass which was palpable and moderately firm. By ultrasound it measured 3.2 cm. There were no other axillary masses of concern.  Biopsy of the left axillary mass the same day showed ([id number]) a high-grade carcinoma which was estrogen and progesterone receptor negative, HER-2 negative, with a signals ratio of 1.53 and the number per cell of 2.60, with an MIB-1 of 95%. It was also gross cystic disease fluid protein negative, and negative for cytokeratin 20 and TTF-1. It was positive for cytokeratin 7. This was felt to be consistent with a breast primary, but gynecologic and other malignancies were also in the differential.  In general 04/08/2015 the patient had a diagnostic right mammogram, which was negative. On the same day she had bilateral breast MRI. There were no masses or abnormal enhancement in either  breast. In the left axilla there was a round enhancing mass measuring 4.6 cm. There were mildly prominent smaller immediately adjacent lymph nodes. He was no right axillary or internal mammary adenopathy. An incidental lipoma was noted in the left lateral chest wall.  Her subsequent history is as detailed below.  INTERVAL HISTORY:  Aoi returns today to the breast clinic for follow-up of her triple negative breast cancer, accompanied by [name]. She is to begin adjuvant chemotherapy consisting of cyclophosphamide and doxorubicin, with neulasta given on day 2 of treatment for granulocyte support. She has been to chemotherapy school and had her baseline echocardiogram performed.   REVIEW OF SYSTEMS: Yarielys denies fevers, chills, nausea, vomiting, or changes in bowel or bladder habits. She has no shortness of breath, chest pain, cough, palpitations, or fatigue. She has a history of back pain, but this is not debilitating. She does have bilateral upper leg numbness secondary to some bulging disks. She has mild hot flashes. A detailed review of systems is otherwise completely negative.   PAST MEDICAL HISTORY: Past Medical History  Diagnosis Date  . Hypothyroidism   . Headache(784.0)   . Anxiety   . Breast cancer   . Arthritis   . Joint pain     PAST SURGICAL HISTORY: Past Surgical History  Procedure Laterality Date  . Breast reduction surgery    . Cervical cone biopsy    . Cyst removed from neck    . Colonoscopy  11/06/2011    Procedure: COLONOSCOPY;  Surgeon: Dorothyann Peng, MD;  Location: [facility];  Service: Endoscopy;  Laterality: N/A;  11:30 AM  . Steriod injection      FAMILY HISTORY  Family History  Problem Relation Age of Onset  . Colon cancer Neg Hx   . Breast cancer Mother   the patient's father died from a myocardial infarction at the age of 19. [name] died from breast cancer at the age of 54. It was diagnosed at age 54. The patient has 2 brothers, one  sister. There is no other history of breast or ovarian cancer in the family to her knowledge.  GYNECOLOGIC HISTORY:  Patient's last menstrual period was 07/20/2013 (approximate). Menarche age 54, first live birth age 54. The patient is GX P2. She used oral contraceptives for a few months remotely with no side effects. She stopped having periods in September 2014. 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 [name] is a Merchant navy officer with [employer]. At home it's just the 2 of them, with no pets. Aundraya's children from an earlier marriage are [name]. [name], who lives in [place] and is a Public relations account executive; and [name], who lives in [place] and is a Biochemist, clinical. The patient has 4 grandchildren +2 by adoption. She attends a [employer] locally    ADVANCED DIRECTIVES: Not in place   HEALTH MAINTENANCE: History  Substance Use Topics  . Smoking status: Never Smoker   . Smokeless tobacco: Not on file  . Alcohol Use: No     Colonoscopy: 2013/ [facility]  PAP: February 2015  Bone density: Never  Lipid panel:  No Known Allergies  Current Outpatient Prescriptions  Medication Sig Dispense Refill  . ALPRAZolam (XANAX) 0.5 MG tablet Take 0.5 mg by mouth daily as needed for anxiety. For anxiety    . Cyanocobalamin (VITAMIN B 12 PO) Take 1 tablet by mouth daily.    Marland Kitchen dexamethasone (DECADRON) 4 MG tablet Take 2 tablets by mouth once a day on the day after chemotherapy and then take 2 tablets two times a day for 2 days. Take with food. 30 tablet 1  . gabapentin (NEURONTIN) 100 MG capsule Take 100 mg by mouth.    . levothyroxine (SYNTHROID, LEVOTHROID) 75 MCG tablet Take 75 mcg by mouth daily before breakfast.    . lidocaine-prilocaine (EMLA) cream Apply to affected area once 30 g 3  . Multiple Vitamin (MULTIVITAMIN WITH MINERALS) TABS tablet Take 1 tablet by mouth daily.    Marland Kitchen UNABLE TO FIND Apply  topically 4 (four) times daily. Keto%/Bac2%/Gab5%/Lido5%    . cyclobenzaprine (FLEXERIL) 10 MG tablet   0  . HYDROcodone-acetaminophen (NORCO/VICODIN) 5-325 MG per tablet     . ibuprofen (ADVIL,MOTRIN) 200 MG tablet Take 400 mg by mouth every 6 (six) hours as needed for moderate pain.    Marland Kitchen LORazepam (ATIVAN) 0.5 MG tablet Take 1 tablet (0.5 mg total) by mouth at bedtime as needed (Nausea or vomiting). (Patient not taking: Reported on 01/17/2015) 30 tablet 0  . Melatonin 3 MG TABS Take 3 mg by mouth at bedtime as needed (Sleep).    . Omega-3 Fatty Acids (FISH OIL) 1000 MG CAPS Take 1 capsule by mouth daily.    . ondansetron (ZOFRAN) 8 MG tablet Take 1 tablet (8 mg total) by mouth 2 (two) times daily as needed. Start on the third day after chemotherapy. (Patient not taking: Reported on 01/17/2015) 30 tablet 1  . prochlorperazine (COMPAZINE) 10 MG tablet Take 1 tablet (10 mg total) by mouth every 6 (six) hours as needed (Nausea or vomiting). (Patient not taking:  Reported on 01/17/2015) 30 tablet 1   No current facility-administered medications for this visit.   Facility-Administered Medications Ordered in Other Visits  Medication Dose Route Frequency Provider Last Rate Last Dose  . cyclophosphamide (CYTOXAN) 1,160 mg in sodium chloride 0.9 % 250 mL chemo infusion  600 mg/m2 (Treatment Plan Actual) Intravenous Once Chauncey Cruel, MD      . DOXOrubicin (ADRIAMYCIN) chemo injection 116 mg  60 mg/m2 (Treatment Plan Actual) Intravenous Once Chauncey Cruel, MD      . heparin lock flush 100 unit/mL  500 Units Intracatheter Once PRN Chauncey Cruel, MD      . pegfilgrastim (NEULASTA ONPRO KIT) injection 6 mg  6 mg Subcutaneous Once Chauncey Cruel, MD      . sodium chloride 0.9 % injection 10 mL  10 mL Intracatheter PRN Chauncey Cruel, MD        OBJECTIVE: Middle-aged African-American woman in no acute distress Filed Vitals:   01/17/15 1034  BP: 130/74  Pulse: 78  Temp: 98.5 F (36.9 C)    Resp: 18     Body mass index is 26.97 kg/(m^2).    ECOG FS:1 - Symptomatic but completely ambulatory   Skin: warm, dry  HEENT: sclerae anicteric, conjunctivae pink, oropharynx clear. No thrush or mucositis.  Lymph Nodes: No cervical or supraclavicular lymphadenopathy  Lungs: clear to auscultation bilaterally, no rales, wheezes, or rhonci  Heart: regular rate and rhythm  Abdomen: round, soft, non tender, positive bowel sounds  Musculoskeletal: No focal spinal tenderness, no peripheral edema  Neuro: non focal, well oriented, positive affect  Breasts: deferred  LAB RESULTS:  CMP     Component Value Date/Time   NA 143 01/17/2015 1020   NA 142 01/13/2014 1928   K 4.0 01/17/2015 1020   K 3.5* 01/13/2014 1928   CL 103 01/13/2014 1928   CO2 27 01/17/2015 1020   CO2 25 12/23/2010 1229   GLUCOSE 86 01/17/2015 1020   GLUCOSE 82 01/13/2014 1928   BUN 15.2 01/17/2015 1020   BUN 14 01/13/2014 1928   CREATININE 0.8 01/17/2015 1020   CREATININE 1.00 01/13/2014 1928   CALCIUM 9.5 01/17/2015 1020   CALCIUM 9.1 12/23/2010 1229   PROT 7.6 01/17/2015 1020   ALBUMIN 4.1 01/17/2015 1020   AST 15 01/17/2015 1020   ALT 15 01/17/2015 1020   ALKPHOS 63 01/17/2015 1020   BILITOT 0.34 01/17/2015 1020   GFRNONAA >60 12/23/2010 1229   GFRAA  12/23/2010 1229    >60        The eGFR has been calculated using the MDRD equation. This calculation has not been validated in all clinical situations. eGFR's persistently <60 mL/min signify possible Chronic Kidney Disease.    INo results found for: SPEP, UPEP  Lab Results  Component Value Date   WBC 5.6 01/17/2015   NEUTROABS 3.5 01/17/2015   HGB 12.8 01/17/2015   HCT 39.1 01/17/2015   MCV 83.2 01/17/2015   PLT 281 01/17/2015      Chemistry      Component Value Date/Time   NA 143 01/17/2015 1020   NA 142 01/13/2014 1928   K 4.0 01/17/2015 1020   K 3.5* 01/13/2014 1928   CL 103 01/13/2014 1928   CO2 27 01/17/2015 1020   CO2 25  12/23/2010 1229   BUN 15.2 01/17/2015 1020   BUN 14 01/13/2014 1928   CREATININE 0.8 01/17/2015 1020   CREATININE 1.00 01/13/2014 1928      Component  Value Date/Time   CALCIUM 9.5 01/17/2015 1020   CALCIUM 9.1 12/23/2010 1229   ALKPHOS 63 01/17/2015 1020   AST 15 01/17/2015 1020   ALT 15 01/17/2015 1020   BILITOT 0.34 01/17/2015 1020       No results found for: LABCA2  No components found for: LABCA125  No results for input(s): INR in the last 168 hours.  Urinalysis    Component Value Date/Time   COLORURINE YELLOW 01/13/2014 1924   APPEARANCEUR CLEAR 01/13/2014 1924   LABSPEC >1.030* 01/13/2014 1924   PHURINE 5.5 01/13/2014 1924   GLUCOSEU NEGATIVE 01/13/2014 1924   HGBUR NEGATIVE 01/13/2014 1924   BILIRUBINUR NEGATIVE 01/13/2014 1924   KETONESUR NEGATIVE 01/13/2014 1924   PROTEINUR NEGATIVE 01/13/2014 1924   UROBILINOGEN 0.2 01/13/2014 1924   NITRITE NEGATIVE 01/13/2014 1924   LEUKOCYTESUR NEGATIVE 01/13/2014 1924    STUDIES: No results found.  ASSESSMENT: 54 y.o. [place] woman status post biopsy 11/15/2014 of a 4.6 cm left axillary mass showing a poorly differentiated carcinoma, triple negative, with an MIB-1 of 90%.  (1) s/p left axillary lymph node dissection under Dr.Howard-McNatt 12/23/2014 with 1 of 14 lymph nodes positive; TX N1, stage II; repeat prognostic panel pending  (2) adjuvant chemotherapy to start 01/17/2015, consisting of cyclophosphamide and doxorubicin 4 given in dose dense fashion, followed by paclitaxel and carboplatin given weekly 12  (a) neulasta to be given on day 2, via onbody injector  (3) radiation will follow chemotherapy  (4) genetics counseling has been scheduled  PLAN: Lawson and I spent about 30 minutes in preparation for chemo today. Though she has reviewed the drugs and her antiemetic schedule with Dr. Jana Hakim previously, we reviewed them all today as she had a few questions. The labs were reviewed in detail and were entirely  stable. She will proceed with day 1, cycle 1 of cyclophosphamide and doxorubicin today as planned. She has opted for the neulasta onbody injector instead of receiving the injection at [facility] on day 2.   Cherrell will return next week for labs and a nadir visit. She understands and agrees with this plan. She knows the goal of treatment in her case is cure. She has been encouraged to call with any issues that might arise before her next visit here.   Marcelino Duster, NP   01/17/2015 1:14 PM

## 2015-01-17 NOTE — Progress Notes (Signed)
I briefly met with Ms. Robin Franklin during her 1st chemotherapy infusion in the treatment room today.  We discussed the purpose of the Survivorship Clinic, which will include monitoring for recurrence, coordinating completion of age and gender-appropriate cancer screenings, promotion of overall wellness, as well as managing potential late/long-term side effects of anti-cancer treatments.    As of today, the intent of treatment for Ms. Robin Franklin is cure. Therefore, she will be eligible for the Survivorship Clinic upon her completion of treatment.  Her survivorship care plan (SCP) document will be drafted and updated throughout the course of her treatment trajectory. She will receive the SCP in an office visit with myself in the Survivorship Clinic once she has completed treatment.   Ms. Robin Franklin was encouraged to ask questions and all questions were answered to her satisfaction.  She was given my business card and encouraged to contact me with any concerns regarding survivorship.  I look forward to participating in her care.   Mike Craze, NP [place] [phone]

## 2015-01-17 NOTE — Patient Instructions (Addendum)
[facility] Discharge Instructions for Patients Receiving Chemotherapy  Today you received the following chemotherapy agents: Adriamycin, Cytoxan.  To help prevent nausea and vomiting after your treatment, we encourage you to take your nausea medication: Compazine 10 mg every 6 hrs as needed; Zofran 8 mg: Take 1 tablet (8 mg total) by mouth 2 (two) times daily as needed. Start on the third day after chemotherapy.   If you develop nausea and vomiting that is not controlled by your nausea medication, call the clinic.   BELOW ARE SYMPTOMS THAT SHOULD BE REPORTED IMMEDIATELY:  *FEVER GREATER THAN 100.5 F  *CHILLS WITH OR WITHOUT FEVER  NAUSEA AND VOMITING THAT IS NOT CONTROLLED WITH YOUR NAUSEA MEDICATION  *UNUSUAL SHORTNESS OF BREATH  *UNUSUAL BRUISING OR BLEEDING  TENDERNESS IN MOUTH AND THROAT WITH OR WITHOUT PRESENCE OF ULCERS  *URINARY PROBLEMS  *BOWEL PROBLEMS  UNUSUAL RASH Items with * indicate a potential emergency and should be followed up as soon as possible.  Feel free to call the clinic you have any questions or concerns. The clinic phone number is [phone].

## 2015-01-17 NOTE — Progress Notes (Signed)
Met with pt during 1st time chemotherapy treatment in infusion room. Pt relate she is doing well and recovering from surgery without problems. Denies needs or questions at this time. Encourage pt to call with concerns. Received verbal understanding. 

## 2015-01-18 ENCOUNTER — Telehealth: Payer: Self-pay | Admitting: *Deleted

## 2015-01-18 ENCOUNTER — Ambulatory Visit (HOSPITAL_COMMUNITY): Payer: 59

## 2015-01-18 NOTE — Telephone Encounter (Signed)
-----   Message from Gabriel Earing, RN sent at 01/17/2015  3:46 PM EST ----- Regarding: chemo follow up call First Adriamycin and Cytoxan.  Dr Jana Hakim.

## 2015-01-18 NOTE — Telephone Encounter (Signed)
Chemo follow up: Patient had no complaints other than a little nausea. Instructed patient to continue taking nausea medication and call us if symptoms do not resolve. Confirmed Onpro injection was still working accurately and claritin taken to help with symptoms from injection. Patient verbalized instructions.

## 2015-01-19 ENCOUNTER — Other Ambulatory Visit: Payer: Self-pay | Admitting: Oncology

## 2015-01-19 NOTE — Progress Notes (Unsigned)
[place]  Telephone:(336) [phone] Fax:(336) [phone]     ID: Robin Franklin DOB: September 03, 1961  MR#: [id number]  HER#:[id number]  Patient Care Team: Redmond School, MD as PCP - General (Internal Medicine) Stark Klein, MD as Consulting Physician (General Surgery) Chauncey Cruel, MD as Consulting Physician (Oncology) Rexene Edison, MD as Consulting Physician (Radiation Oncology) Vania Rea, MD as Consulting Physician (Obstetrics and Gynecology) Newman Pies, MD as Consulting Physician (Neurosurgery) Angelina Ok, MD as Referring Physician (Surgery) OTHER MD:  CHIEF COMPLAINT: Triple negative breast cancer   CURRENT TREATMENT: Adjuvant chemotherapy  BREAST CANCER HISTORY: From the original intake note:  Robin Franklin herself noted a mass in her left axilla sometime in late November or she brought it to Dr. Nolon Rod attention and on 11/15/2014 she had a left mammogram and left ultrasound at the breast Center. The breast density was category 8. The left breast was entirely unremarkable, with no masses, distortion, or calcifications. In the left axilla there was a mass which was palpable and moderately firm. By ultrasound it measured 3.2 cm. There were no other axillary masses of concern.  Biopsy of the left axillary mass the same day showed ([id number]) a high-grade carcinoma which was estrogen and progesterone receptor negative, HER-2 negative, with a signals ratio of 1.53 and the number per cell of 2.60, with an MIB-1 of 95%. It was also gross cystic disease fluid protein negative, and negative for cytokeratin 20 and TTF-1. It was positive for cytokeratin 7. This was felt to be consistent with a breast primary, but gynecologic and other malignancies were also in the differential.  In general 04/08/2015 the patient had a diagnostic right mammogram, which was negative. On the same day she had bilateral breast MRI. There were no masses or abnormal enhancement in either  breast. In the left axilla there was a round enhancing mass measuring 4.6 cm. There were mildly prominent smaller immediately adjacent lymph nodes. He was no right axillary or internal mammary adenopathy. An incidental lipoma was noted in the left lateral chest wall.  Her subsequent history is as detailed below.  INTERVAL HISTORY:  Robin Franklin returns today to the breast clinic for follow-up of her triple negative breast cancer, accompanied by her husband Robin Franklin. She is to begin adjuvant chemotherapy consisting of cyclophosphamide and doxorubicin, with neulasta given on day 2 of treatment for granulocyte support. She has been to chemotherapy school and had her baseline echocardiogram performed.   REVIEW OF SYSTEMS: [name] denies fevers, chills, nausea, vomiting, or changes in bowel or bladder habits. She has no shortness of breath, chest pain, cough, palpitations, or fatigue. She has a history of back pain, but this is not debilitating. She does have bilateral upper leg numbness secondary to some bulging disks. She has mild hot flashes. A detailed review of systems is otherwise completely negative.   PAST MEDICAL HISTORY: Past Medical History  Diagnosis Date  . Hypothyroidism   . Headache(784.0)   . Anxiety   . Breast cancer   . Arthritis   . Joint pain     PAST SURGICAL HISTORY: Past Surgical History  Procedure Laterality Date  . Breast reduction surgery    . Cervical cone biopsy    . Cyst removed from neck    . Colonoscopy  11/06/2011    Procedure: COLONOSCOPY;  Surgeon: Dorothyann Peng, MD;  [facility];  Service: Endoscopy;  Laterality: N/A;  11:30 AM  . Steriod injection      FAMILY HISTORY  Family History  Problem Relation Age of Onset  . Colon cancer Neg Hx   . Breast cancer Mother   the patient's father died from a myocardial infarction at the age of 60. [name] died from breast cancer at the age of 45. It was diagnosed at age 7. The patient has 2 brothers, one  sister. There is no other history of breast or ovarian cancer in the family to her knowledge.  GYNECOLOGIC HISTORY:  Patient's last menstrual period was 07/20/2013 (approximate). Menarche age 71, first live birth age 50. The patient is GX P2. She used oral contraceptives for a few months remotely with no side effects. She stopped having periods in September 2014. 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 At home it's just the 2 of them, with no pets. Robin Franklin's children from an earlier marriage are Robin Franklin. Robin Franklin, who lives in [place] and is a Public relations account executive; and Robin Franklin, who lives in [place] and is a Biochemist, clinical. The patient has 4 grandchildren +2 by adoption. She attends a [employer] locally    ADVANCED DIRECTIVES: Not in place   HEALTH MAINTENANCE: History  Substance Use Topics  . Smoking status: Never Smoker   . Smokeless tobacco: Not on file  . Alcohol Use: No     Colonoscopy: 2013/ Robin Franklin  PAP: February 2015  Bone density: Never  Lipid panel:  No Known Allergies  Current Outpatient Prescriptions  Medication Sig Dispense Refill  . ALPRAZolam (XANAX) 0.5 MG tablet Take 0.5 mg by mouth daily as needed for anxiety. For anxiety    . Cyanocobalamin (VITAMIN B 12 PO) Take 1 tablet by mouth daily.    . cyclobenzaprine (FLEXERIL) 10 MG tablet   0  . dexamethasone (DECADRON) 4 MG tablet Take 2 tablets by mouth once a day on the day after chemotherapy and then take 2 tablets two times a day for 2 days. Take with food. 30 tablet 1  . gabapentin (NEURONTIN) 100 MG capsule Take 100 mg by mouth.    Marland Kitchen HYDROcodone-acetaminophen (NORCO/VICODIN) 5-325 MG per tablet     . ibuprofen (ADVIL,MOTRIN) 200 MG tablet Take 400 mg by mouth every 6 (six) hours as needed for moderate pain.    Marland Kitchen levothyroxine (SYNTHROID, LEVOTHROID) 75 MCG tablet Take 75  mcg by mouth daily before breakfast.    . lidocaine-prilocaine (EMLA) cream Apply to affected area once 30 g 3  . LORazepam (ATIVAN) 0.5 MG tablet Take 1 tablet (0.5 mg total) by mouth at bedtime as needed (Nausea or vomiting). (Patient not taking: Reported on 01/17/2015) 30 tablet 0  . Melatonin 3 MG TABS Take 3 mg by mouth at bedtime as needed (Sleep).    . Multiple Vitamin (MULTIVITAMIN WITH MINERALS) TABS tablet Take 1 tablet by mouth daily.    . Omega-3 Fatty Acids (FISH OIL) 1000 MG CAPS Take 1 capsule by mouth daily.    . ondansetron (ZOFRAN) 8 MG tablet Take 1 tablet (8 mg total) by mouth 2 (two) times daily as needed. Start on the third day after chemotherapy. (Patient not taking: Reported on 01/17/2015) 30 tablet 1  . prochlorperazine (COMPAZINE) 10 MG tablet Take 1 tablet (10 mg total) by mouth every 6 (six) hours as needed (Nausea or vomiting). (Patient not taking: Reported on 01/17/2015) 30 tablet 1  . UNABLE TO FIND Apply topically 4 (  four) times daily. Keto%/Bac2%/Gab5%/Lido5%     No current facility-administered medications for this visit.    OBJECTIVE: Middle-aged African-American woman in no acute distress There were no vitals filed for this visit.   There is no weight on file to calculate BMI.    ECOG FS:1 - Symptomatic but completely ambulatory   Skin: warm, dry  HEENT: sclerae anicteric, conjunctivae pink, oropharynx clear. No thrush or mucositis.  Lymph Nodes: No cervical or supraclavicular lymphadenopathy  Lungs: clear to auscultation bilaterally, no rales, wheezes, or rhonci  Heart: regular rate and rhythm  Abdomen: round, soft, non tender, positive bowel sounds  Musculoskeletal: No focal spinal tenderness, no peripheral edema  Neuro: non focal, well oriented, positive affect  Breasts: deferred  LAB RESULTS:  CMP     Component Value Date/Time   NA 143 01/17/2015 1020   NA 142 01/13/2014 1928   K 4.0 01/17/2015 1020   K 3.5* 01/13/2014 1928   CL 103 01/13/2014  1928   CO2 27 01/17/2015 1020   CO2 25 12/23/2010 1229   GLUCOSE 86 01/17/2015 1020   GLUCOSE 82 01/13/2014 1928   BUN 15.2 01/17/2015 1020   BUN 14 01/13/2014 1928   CREATININE 0.8 01/17/2015 1020   CREATININE 1.00 01/13/2014 1928   CALCIUM 9.5 01/17/2015 1020   CALCIUM 9.1 12/23/2010 1229   PROT 7.6 01/17/2015 1020   ALBUMIN 4.1 01/17/2015 1020   AST 15 01/17/2015 1020   ALT 15 01/17/2015 1020   ALKPHOS 63 01/17/2015 1020   BILITOT 0.34 01/17/2015 1020   GFRNONAA >60 12/23/2010 1229   GFRAA  12/23/2010 1229    >60        The eGFR has been calculated using the MDRD equation. This calculation has not been validated in all clinical situations. eGFR's persistently <60 mL/min signify possible Chronic Kidney Disease.    INo results found for: SPEP, UPEP  Lab Results  Component Value Date   WBC 5.6 01/17/2015   NEUTROABS 3.5 01/17/2015   HGB 12.8 01/17/2015   HCT 39.1 01/17/2015   MCV 83.2 01/17/2015   PLT 281 01/17/2015      Chemistry      Component Value Date/Time   NA 143 01/17/2015 1020   NA 142 01/13/2014 1928   K 4.0 01/17/2015 1020   K 3.5* 01/13/2014 1928   CL 103 01/13/2014 1928   CO2 27 01/17/2015 1020   CO2 25 12/23/2010 1229   BUN 15.2 01/17/2015 1020   BUN 14 01/13/2014 1928   CREATININE 0.8 01/17/2015 1020   CREATININE 1.00 01/13/2014 1928      Component Value Date/Time   CALCIUM 9.5 01/17/2015 1020   CALCIUM 9.1 12/23/2010 1229   ALKPHOS 63 01/17/2015 1020   AST 15 01/17/2015 1020   ALT 15 01/17/2015 1020   BILITOT 0.34 01/17/2015 1020       No results found for: LABCA2  No components found for: LABCA125  No results for input(s): INR in the last 168 hours.  Urinalysis    Component Value Date/Time   COLORURINE YELLOW 01/13/2014 1924   APPEARANCEUR CLEAR 01/13/2014 1924   LABSPEC >1.030* 01/13/2014 1924   PHURINE 5.5 01/13/2014 1924   GLUCOSEU NEGATIVE 01/13/2014 1924   HGBUR NEGATIVE 01/13/2014 1924   BILIRUBINUR NEGATIVE  01/13/2014 1924   KETONESUR NEGATIVE 01/13/2014 1924   PROTEINUR NEGATIVE 01/13/2014 1924   UROBILINOGEN 0.2 01/13/2014 1924   NITRITE NEGATIVE 01/13/2014 1924   LEUKOCYTESUR NEGATIVE 01/13/2014 1924    STUDIES: No  results found.  ASSESSMENT: 54 y.o. [place] woman status post biopsy 11/15/2014 of a 4.6 cm left axillary mass showing a poorly differentiated carcinoma, triple negative, with an MIB-1 of 90%.  (1) s/p left axillary lymph node dissection under Dr.Howard-McNatt 12/23/2014 with 1 of 14 lymph nodes positive; TX N1, stage II; repeat prognostic panel pending  (2) adjuvant chemotherapy to start 01/17/2015, consisting of cyclophosphamide and doxorubicin 4 given in dose dense fashion, followed by paclitaxel and carboplatin given weekly 12  (a) neulasta to be given on day 2, via onbody injector  (3) radiation will follow chemotherapy  (4) genetics counseling has been scheduled  PLAN: Robin Franklin and I spent about 30 minutes in preparation for chemo today. Though she has reviewed the drugs and her antiemetic schedule with Dr. Jana Hakim previously, we reviewed them all today as she had a few questions. The labs were reviewed in detail and were entirely stable. She will proceed with day 1, cycle 1 of cyclophosphamide and doxorubicin today as planned. She has opted for the neulasta onbody injector instead of receiving the injection at [facility] on day 2.   Robin Franklin will return next week for labs and a nadir visit. She understands and agrees with this plan. She knows the goal of treatment in her case is cure. She has been encouraged to call with any issues that might arise before her next visit here.   Chauncey Cruel, MD   01/19/2015 10:15 AM

## 2015-01-20 ENCOUNTER — Telehealth: Payer: Self-pay | Admitting: *Deleted

## 2015-01-20 NOTE — Telephone Encounter (Signed)
Robin Franklin reports she has a bulging disc that is causing a problem.  After neulasta has more pain to back.  Stopped neurontin because receiving chemotherapy.  Asking if she may resume gabapentin for her back pain.  "D. Magrinat said to take gabapentin and Aleve but I want to make sure.  Also have gabapentin cream."   This nurse informed her to follow provider instructions.   Suggested heating paduse as needed.

## 2015-01-24 ENCOUNTER — Other Ambulatory Visit (HOSPITAL_BASED_OUTPATIENT_CLINIC_OR_DEPARTMENT_OTHER): Payer: 59

## 2015-01-24 ENCOUNTER — Encounter: Payer: Self-pay | Admitting: Nurse Practitioner

## 2015-01-24 ENCOUNTER — Ambulatory Visit (HOSPITAL_BASED_OUTPATIENT_CLINIC_OR_DEPARTMENT_OTHER): Payer: 59 | Admitting: Nurse Practitioner

## 2015-01-24 VITALS — BP 116/57 | HR 70 | Temp 98.3°F | Resp 18 | Ht 67.0 in | Wt 173.8 lb

## 2015-01-24 DIAGNOSIS — C50612 Malignant neoplasm of axillary tail of left female breast: Secondary | ICD-10-CM

## 2015-01-24 DIAGNOSIS — C50912 Malignant neoplasm of unspecified site of left female breast: Secondary | ICD-10-CM

## 2015-01-24 DIAGNOSIS — Z171 Estrogen receptor negative status [ER-]: Secondary | ICD-10-CM

## 2015-01-24 DIAGNOSIS — R2232 Localized swelling, mass and lump, left upper limb: Secondary | ICD-10-CM

## 2015-01-24 LAB — COMPREHENSIVE METABOLIC PANEL (CC13)
ALBUMIN: 3.7 g/dL (ref 3.5–5.0)
ALT: 43 U/L (ref 0–55)
AST: 19 U/L (ref 5–34)
Alkaline Phosphatase: 71 U/L (ref 40–150)
Anion Gap: 8 mEq/L (ref 3–11)
BUN: 10.7 mg/dL (ref 7.0–26.0)
CHLORIDE: 101 meq/L (ref 98–109)
CO2: 31 mEq/L — ABNORMAL HIGH (ref 22–29)
Calcium: 9.3 mg/dL (ref 8.4–10.4)
Creatinine: 0.8 mg/dL (ref 0.6–1.1)
EGFR: >90 mL/min/{1.73_m2} (ref >90)
Glucose: 100 mg/dl (ref 70–140)
Potassium: 4.5 mEq/L (ref 3.5–5.1)
Sodium: 140 mEq/L (ref 136–145)
Total Bilirubin: 0.23 mg/dL (ref 0.20–1.20)
Total Protein: 6.7 g/dL (ref 6.4–8.3)

## 2015-01-24 LAB — CBC WITH DIFFERENTIAL/PLATELET
BASO%: 0.8 % (ref 0.0–2.0)
Basophils Absolute: 0 10*3/uL (ref 0.0–0.1)
EOS ABS: 0.2 10*3/uL (ref 0.0–0.5)
EOS%: 6.7 % (ref 0.0–7.0)
HEMATOCRIT: 37.7 % (ref 34.8–46.6)
HGB: 12 g/dL (ref 11.6–15.9)
LYMPH%: 49.3 % (ref 14.0–49.7)
MCH: 26.9 pg (ref 25.1–34.0)
MCHC: 32 g/dL (ref 31.5–36.0)
MCV: 84.2 fL (ref 79.5–101.0)
MONO#: 0.2 10*3/uL (ref 0.1–0.9)
MONO%: 8 % (ref 0.0–14.0)
NEUT%: 35.2 % — AB (ref 38.4–76.8)
NEUTROS ABS: 0.8 10*3/uL — AB (ref 1.5–6.5)
PLATELETS: 151 10*3/uL (ref 145–400)
RBC: 4.47 10*6/uL (ref 3.70–5.45)
RDW: 12.9 % (ref 11.2–14.5)
WBC: 2.4 10*3/uL — AB (ref 3.9–10.3)
lymph#: 1.2 10*3/uL (ref 0.9–3.3)

## 2015-01-24 NOTE — Progress Notes (Signed)
[place]  Telephone:(336) [phone] Fax:(336) [phone]     ID: Robin Franklin DOB: February 04, 1961  MR#: [id number]  RWE#:[id number]  Patient Care Team: Redmond School, MD as PCP - General (Internal Medicine) Stark Klein, MD as Consulting Physician (General Surgery) Chauncey Cruel, MD as Consulting Physician (Oncology) Arloa Koh, MD as Consulting Physician (Radiation Oncology) Vania Rea, MD as Consulting Physician (Obstetrics and Gynecology) Newman Pies, MD as Consulting Physician (Neurosurgery) Angelina Ok, MD as Referring Physician (Surgery) OTHER MD:  CHIEF COMPLAINT: Triple negative breast cancer  CURRENT TREATMENT: Adjuvant chemotherapy  BREAST CANCER HISTORY: From the original intake note:  Robin Franklin herself noted a mass in her left axilla sometime in late November or she brought it to Dr. Nolon Rod attention and on 11/15/2014 she had a left mammogram and left ultrasound at the breast Center. The breast density was category 8. The left breast was entirely unremarkable, with no masses, distortion, or calcifications. In the left axilla there was a mass which was palpable and moderately firm. By ultrasound it measured 3.2 cm. There were no other axillary masses of concern.  Biopsy of the left axillary mass the same day showed ([id number]) a high-grade carcinoma which was estrogen and progesterone receptor negative, HER-2 negative, with a signals ratio of 1.53 and the number per cell of 2.60, with an MIB-1 of 95%. It was also gross cystic disease fluid protein negative, and negative for cytokeratin 20 and TTF-1. It was positive for cytokeratin 7. This was felt to be consistent with a breast primary, but gynecologic and other malignancies were also in the differential.  In general 04/08/2015 the patient had a diagnostic right mammogram, which was negative. On the same day she had bilateral breast MRI. There were no masses or abnormal enhancement in either breast.  In the left axilla there was a round enhancing mass measuring 4.6 cm. There were mildly prominent smaller immediately adjacent lymph nodes. He was no right axillary or internal mammary adenopathy. An incidental lipoma was noted in the left lateral chest wall.  Her subsequent history is as detailed below.  INTERVAL HISTORY:  Robin Franklin today to the breast clinic for follow-up of her triple negative breast cancer, accompanied by her son. Today is day 8, cycle 1 of cyclophosphamide and doxorubicin, with neulasta given on day 2 of treatment for granulocyte support.   REVIEW OF SYSTEMS: Robin Franklin denies fevers, chills, or changes in bowel or bladder habits. Her main complaint this last week. She had "major nausea" on day 1, but no vomiting. This decreased on day 2, and by day 3 she had none. She took all of her antiemetics as directed. Her appetite is increased and she is worried that she will gain wight. The steroids keep her up at night.  She had some increased back pain from the neulasta, but it is getting better. A detailed review of systems is otherwise stable.   PAST MEDICAL HISTORY: Past Medical History  Diagnosis Date  . Hypothyroidism   . Headache(784.0)   . Anxiety   . Breast cancer   . Arthritis   . Joint pain     PAST SURGICAL HISTORY: Past Surgical History  Procedure Laterality Date  . Breast reduction surgery    . Cervical cone biopsy    . Cyst removed from neck    . Colonoscopy  11/06/2011    Procedure: COLONOSCOPY;  Surgeon: Dorothyann Peng, MD;  [facility];  Service: Endoscopy;  Laterality: N/A;  11:30 AM  .  Steriod injection      FAMILY HISTORY Family History  Problem Relation Age of Onset  . Colon cancer Neg Hx   . Breast cancer Mother   the patient's father died from a myocardial infarction at the age of 62. [name] died from breast cancer at the age of 64. It was diagnosed at age 60. The patient has 2 brothers, one sister. There is no other  history of breast or ovarian cancer in the family to her knowledge.  GYNECOLOGIC HISTORY:  Patient's last menstrual period was 07/20/2013 (approximate). Menarche age 35, first live birth age 57. The patient is GX P2. She used oral contraceptives for a few months remotely with no side effects. She stopped having periods in September 2014. 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 At home it's just the 2 of them, with no pets. Robin Franklin's children from an earlier marriage are Robin Franklin. Robin Franklin, who lives in [place] and is a Public relations account executive; and Robin Franklin, who lives in [place] and is a Biochemist, clinical. The patient has 4 grandchildren +2 by adoption. She attends a [employer] locally    ADVANCED DIRECTIVES: Not in place   HEALTH MAINTENANCE: History  Substance Use Topics  . Smoking status: Never Smoker   . Smokeless tobacco: Not on file  . Alcohol Use: No     Colonoscopy: 2013/ [facility]  PAP: February 2015  Bone density: Never  Lipid panel:  No Known Allergies  Current Outpatient Prescriptions  Medication Sig Dispense Refill  . ALPRAZolam (XANAX) 0.5 MG tablet Take 0.5 mg by mouth daily as needed for anxiety. For anxiety    . Cyanocobalamin (VITAMIN B 12 PO) Take 1 tablet by mouth daily.    Marland Kitchen dexamethasone (DECADRON) 4 MG tablet Take 2 tablets by mouth once a day on the day after chemotherapy and then take 2 tablets two times a day for 2 days. Take with food. 30 tablet 1  . gabapentin (NEURONTIN) 100 MG capsule Take 100 mg by mouth.    . levothyroxine (SYNTHROID, LEVOTHROID) 75 MCG tablet Take 75 mcg by mouth daily before breakfast.    . lidocaine-prilocaine (EMLA) cream Apply to affected area once 30 g 3  . LORazepam (ATIVAN) 0.5 MG tablet Take 1 tablet (0.5 mg total) by mouth at bedtime as needed (Nausea or vomiting). 30 tablet 0  . Multiple  Vitamin (MULTIVITAMIN WITH MINERALS) TABS tablet Take 1 tablet by mouth daily.    . Omega-3 Fatty Acids (FISH OIL) 1000 MG CAPS Take 1 capsule by mouth daily.    . ondansetron (ZOFRAN) 8 MG tablet Take 1 tablet (8 mg total) by mouth 2 (two) times daily as needed. Start on the third day after chemotherapy. 30 tablet 1  . prochlorperazine (COMPAZINE) 10 MG tablet Take 1 tablet (10 mg total) by mouth every 6 (six) hours as needed (Nausea or vomiting). 30 tablet 1  . UNABLE TO FIND Apply topically 4 (four) times daily. Keto%/Bac2%/Gab5%/Lido5%    . cyclobenzaprine (FLEXERIL) 10 MG tablet   0  . HYDROcodone-acetaminophen (NORCO/VICODIN) 5-325 MG per tablet     . ibuprofen (ADVIL,MOTRIN) 200 MG tablet Take 400 mg by mouth every 6 (six) hours as needed for moderate pain.    . Melatonin 3 MG TABS Take 3 mg by mouth at bedtime as needed (Sleep).     No  current facility-administered medications for this visit.    OBJECTIVE: Middle-aged African-American woman in no acute distress Filed Vitals:   01/24/15 1453  BP: 116/57  Pulse: 70  Temp: 98.3 F (36.8 C)  Resp: 18     Body mass index is 27.21 kg/(m^2).    ECOG FS:1 - Symptomatic but completely ambulatory   Sclerae unicteric, pupils equal and reactive Oropharynx clear and moist-- no thrush No cervical or supraclavicular adenopathy Lungs no rales or rhonchi Heart regular rate and rhythm Abd soft, nontender, positive bowel sounds MSK no focal spinal tenderness, no upper extremity lymphedema Neuro: nonfocal, well oriented, appropriate affect Breasts: deferred  LAB RESULTS:  CMP     Component Value Date/Time   NA 140 01/24/2015 1430   NA 142 01/13/2014 1928   K 4.5 01/24/2015 1430   K 3.5* 01/13/2014 1928   CL 103 01/13/2014 1928   CO2 31* 01/24/2015 1430   CO2 25 12/23/2010 1229   GLUCOSE 100 01/24/2015 1430   GLUCOSE 82 01/13/2014 1928   BUN 10.7 01/24/2015 1430   BUN 14 01/13/2014 1928   CREATININE 0.8 01/24/2015 1430    CREATININE 1.00 01/13/2014 1928   CALCIUM 9.3 01/24/2015 1430   CALCIUM 9.1 12/23/2010 1229   PROT 6.7 01/24/2015 1430   ALBUMIN 3.7 01/24/2015 1430   AST 19 01/24/2015 1430   ALT 43 01/24/2015 1430   ALKPHOS 71 01/24/2015 1430   BILITOT 0.23 01/24/2015 1430   GFRNONAA >60 12/23/2010 1229   GFRAA  12/23/2010 1229    >60        The eGFR has been calculated using the MDRD equation. This calculation has not been validated in all clinical situations. eGFR's persistently <60 mL/min signify possible Chronic Kidney Disease.    INo results found for: SPEP, UPEP  Lab Results  Component Value Date   WBC 2.4* 01/24/2015   NEUTROABS 0.8* 01/24/2015   HGB 12.0 01/24/2015   HCT 37.7 01/24/2015   MCV 84.2 01/24/2015   PLT 151 01/24/2015      Chemistry      Component Value Date/Time   NA 140 01/24/2015 1430   NA 142 01/13/2014 1928   K 4.5 01/24/2015 1430   K 3.5* 01/13/2014 1928   CL 103 01/13/2014 1928   CO2 31* 01/24/2015 1430   CO2 25 12/23/2010 1229   BUN 10.7 01/24/2015 1430   BUN 14 01/13/2014 1928   CREATININE 0.8 01/24/2015 1430   CREATININE 1.00 01/13/2014 1928      Component Value Date/Time   CALCIUM 9.3 01/24/2015 1430   CALCIUM 9.1 12/23/2010 1229   ALKPHOS 71 01/24/2015 1430   AST 19 01/24/2015 1430   ALT 43 01/24/2015 1430   BILITOT 0.23 01/24/2015 1430       No results found for: LABCA2  No components found for: LABCA125  No results for input(s): INR in the last 168 hours.  Urinalysis    Component Value Date/Time   COLORURINE YELLOW 01/13/2014 1924   APPEARANCEUR CLEAR 01/13/2014 1924   LABSPEC >1.030* 01/13/2014 1924   PHURINE 5.5 01/13/2014 1924   GLUCOSEU NEGATIVE 01/13/2014 1924   HGBUR NEGATIVE 01/13/2014 1924   BILIRUBINUR NEGATIVE 01/13/2014 1924   KETONESUR NEGATIVE 01/13/2014 1924   PROTEINUR NEGATIVE 01/13/2014 1924   UROBILINOGEN 0.2 01/13/2014 1924   NITRITE NEGATIVE 01/13/2014 1924   LEUKOCYTESUR NEGATIVE 01/13/2014 1924     STUDIES: No results found.  ASSESSMENT: 54 y.o. [place] woman status post biopsy 11/15/2014 of a 4.6 cm  left axillary mass showing a poorly differentiated carcinoma, triple negative, with an MIB-1 of 90%.  (1) s/p left axillary lymph node dissection under Dr.Howard-McNatt 12/23/2014 with 1 of 14 lymph nodes positive; TX N1, stage II; repeat prognostic panel pending  (2) adjuvant chemotherapy to start 01/17/2015, consisting of cyclophosphamide and doxorubicin 4 given in dose dense fashion, followed by paclitaxel and carboplatin given weekly 12  (a) neulasta to be given on day 2, via onbody injector  (3) radiation will follow chemotherapy  (4) genetics counseling has been scheduled  PLAN: Despite the nausea, Robin Franklin did well with her first treatment. The labs were reviewed detail, and besides the drop in [place] to 0.8, the rest of the labs were stable.   Robin Franklin will return next week for cycle 2 of treatment. She understands and agrees with this plan. She knows the goal of treatment in her case is cure. She has been encouraged to call with any issues that might arise before her next visit here.   Laurie Panda, NP   01/24/2015 4:02 PM

## 2015-01-31 ENCOUNTER — Ambulatory Visit (HOSPITAL_BASED_OUTPATIENT_CLINIC_OR_DEPARTMENT_OTHER): Payer: 59

## 2015-01-31 ENCOUNTER — Encounter: Payer: Self-pay | Admitting: Nurse Practitioner

## 2015-01-31 ENCOUNTER — Other Ambulatory Visit: Payer: Self-pay | Admitting: Oncology

## 2015-01-31 ENCOUNTER — Other Ambulatory Visit (HOSPITAL_BASED_OUTPATIENT_CLINIC_OR_DEPARTMENT_OTHER): Payer: 59

## 2015-01-31 ENCOUNTER — Telehealth: Payer: Self-pay | Admitting: Nurse Practitioner

## 2015-01-31 ENCOUNTER — Other Ambulatory Visit: Payer: 59

## 2015-01-31 ENCOUNTER — Ambulatory Visit: Payer: 59

## 2015-01-31 ENCOUNTER — Telehealth: Payer: Self-pay | Admitting: *Deleted

## 2015-01-31 ENCOUNTER — Ambulatory Visit (HOSPITAL_BASED_OUTPATIENT_CLINIC_OR_DEPARTMENT_OTHER): Payer: 59 | Admitting: Nurse Practitioner

## 2015-01-31 VITALS — BP 135/78 | HR 68 | Temp 98.1°F | Resp 18 | Ht 67.0 in | Wt 174.3 lb

## 2015-01-31 DIAGNOSIS — Z452 Encounter for adjustment and management of vascular access device: Secondary | ICD-10-CM

## 2015-01-31 DIAGNOSIS — C773 Secondary and unspecified malignant neoplasm of axilla and upper limb lymph nodes: Secondary | ICD-10-CM | POA: Diagnosis not present

## 2015-01-31 DIAGNOSIS — Z171 Estrogen receptor negative status [ER-]: Secondary | ICD-10-CM

## 2015-01-31 DIAGNOSIS — C50919 Malignant neoplasm of unspecified site of unspecified female breast: Secondary | ICD-10-CM | POA: Diagnosis not present

## 2015-01-31 DIAGNOSIS — Z5111 Encounter for antineoplastic chemotherapy: Secondary | ICD-10-CM | POA: Diagnosis not present

## 2015-01-31 DIAGNOSIS — C50912 Malignant neoplasm of unspecified site of left female breast: Secondary | ICD-10-CM

## 2015-01-31 DIAGNOSIS — Z95828 Presence of other vascular implants and grafts: Secondary | ICD-10-CM

## 2015-01-31 DIAGNOSIS — R2232 Localized swelling, mass and lump, left upper limb: Secondary | ICD-10-CM

## 2015-01-31 LAB — COMPREHENSIVE METABOLIC PANEL (CC13)
ALK PHOS: 67 U/L (ref 40–150)
ALT: 34 U/L (ref 0–55)
ANION GAP: 12 meq/L — AB (ref 3–11)
AST: 17 U/L (ref 5–34)
Albumin: 4.1 g/dL (ref 3.5–5.0)
BUN: 14 mg/dL (ref 7.0–26.0)
CO2: 27 mEq/L (ref 22–29)
Calcium: 9.7 mg/dL (ref 8.4–10.4)
Chloride: 104 mEq/L (ref 98–109)
Creatinine: 0.8 mg/dL (ref 0.6–1.1)
GLUCOSE: 83 mg/dL (ref 70–140)
POTASSIUM: 4 meq/L (ref 3.5–5.1)
SODIUM: 142 meq/L (ref 136–145)
Total Bilirubin: 0.23 mg/dL (ref 0.20–1.20)
Total Protein: 7.3 g/dL (ref 6.4–8.3)

## 2015-01-31 LAB — CBC WITH DIFFERENTIAL/PLATELET
BASO%: 0.7 % (ref 0.0–2.0)
BASOS ABS: 0.1 10*3/uL (ref 0.0–0.1)
EOS ABS: 0 10*3/uL (ref 0.0–0.5)
EOS%: 0.3 % (ref 0.0–7.0)
HEMATOCRIT: 38.4 % (ref 34.8–46.6)
HGB: 12.1 g/dL (ref 11.6–15.9)
LYMPH#: 1.7 10*3/uL (ref 0.9–3.3)
LYMPH%: 14.6 % (ref 14.0–49.7)
MCH: 26.7 pg (ref 25.1–34.0)
MCHC: 31.4 g/dL — AB (ref 31.5–36.0)
MCV: 84.9 fL (ref 79.5–101.0)
MONO#: 0.8 10*3/uL (ref 0.1–0.9)
MONO%: 7.1 % (ref 0.0–14.0)
NEUT%: 77.3 % — AB (ref 38.4–76.8)
NEUTROS ABS: 9.1 10*3/uL — AB (ref 1.5–6.5)
PLATELETS: 207 10*3/uL (ref 145–400)
RBC: 4.52 10*6/uL (ref 3.70–5.45)
RDW: 13 % (ref 11.2–14.5)
WBC: 11.8 10*3/uL — AB (ref 3.9–10.3)

## 2015-01-31 MED ORDER — HEPARIN SOD (PORK) LOCK FLUSH 100 UNIT/ML IV SOLN
500.0000 [IU] | Freq: Once | INTRAVENOUS | Status: AC | PRN
  Administered 2015-01-31: 500 [IU]
  Filled 2015-01-31: qty 5

## 2015-01-31 MED ORDER — SODIUM CHLORIDE 0.9 % IJ SOLN
10.0000 mL | INTRAMUSCULAR | Status: DC | PRN
  Administered 2015-01-31: 10 mL via INTRAVENOUS
  Filled 2015-01-31: qty 10

## 2015-01-31 MED ORDER — PEGFILGRASTIM 6 MG/0.6ML ~~LOC~~ PSKT
6.0000 mg | PREFILLED_SYRINGE | Freq: Once | SUBCUTANEOUS | Status: AC
  Administered 2015-01-31: 6 mg via SUBCUTANEOUS
  Filled 2015-01-31: qty 0.6

## 2015-01-31 MED ORDER — SODIUM CHLORIDE 0.9 % IV SOLN
Freq: Once | INTRAVENOUS | Status: AC
  Administered 2015-01-31: 14:00:00 via INTRAVENOUS

## 2015-01-31 MED ORDER — DOXORUBICIN HCL CHEMO IV INJECTION 2 MG/ML
60.0000 mg/m2 | Freq: Once | INTRAVENOUS | Status: AC
Start: 2015-01-31 — End: 2015-01-31
  Administered 2015-01-31: 116 mg via INTRAVENOUS
  Filled 2015-01-31: qty 58

## 2015-01-31 MED ORDER — SODIUM CHLORIDE 0.9 % IV SOLN
600.0000 mg/m2 | Freq: Once | INTRAVENOUS | Status: AC
  Administered 2015-01-31: 1160 mg via INTRAVENOUS
  Filled 2015-01-31: qty 58

## 2015-01-31 MED ORDER — ALTEPLASE 2 MG IJ SOLR
2.0000 mg | Freq: Once | INTRAMUSCULAR | Status: AC | PRN
  Administered 2015-01-31: 2 mg
  Filled 2015-01-31: qty 2

## 2015-01-31 MED ORDER — SODIUM CHLORIDE 0.9 % IV SOLN
Freq: Once | INTRAVENOUS | Status: AC
  Administered 2015-01-31: 15:00:00 via INTRAVENOUS
  Filled 2015-01-31: qty 5

## 2015-01-31 MED ORDER — SODIUM CHLORIDE 0.9 % IJ SOLN
10.0000 mL | INTRAMUSCULAR | Status: DC | PRN
  Administered 2015-01-31: 10 mL
  Filled 2015-01-31: qty 10

## 2015-01-31 MED ORDER — PALONOSETRON HCL INJECTION 0.25 MG/5ML
0.2500 mg | Freq: Once | INTRAVENOUS | Status: AC
  Administered 2015-01-31: 0.25 mg via INTRAVENOUS

## 2015-01-31 MED ORDER — PALONOSETRON HCL INJECTION 0.25 MG/5ML
INTRAVENOUS | Status: AC
  Filled 2015-01-31: qty 5

## 2015-01-31 NOTE — Progress Notes (Signed)
Patient in for labs today. Patient states, "I have a port, and I want my labs from my port." Appointment with the Flush Room made for Patient by Army Fossa, Lab Receptionist. Patient's Port-A-Cath accessed and flushed without any difficulty. No blood return noted with flush. Patient denies any pain or discomfort. Patient agreed to have all labs drawn by the phlebotomist today. All labs drawn by C.L., Phlebotomist. Infusion Room Charge Nurse, Deland Pretty, RN made aware of no blood return from port. Patient remained access for Infusion Room appointment. Patient ambulating and discharge from Flush Room without any complaints.

## 2015-01-31 NOTE — Telephone Encounter (Signed)
per pof to sch pt appt-sent MW email to sch A/C-pt aware-pt to get updated copy b4 leaving today

## 2015-01-31 NOTE — Patient Instructions (Signed)
[facility] Discharge Instructions for Patients Receiving Chemotherapy  Today you received the following chemotherapy agents: adriamycin, cytoxan  To help prevent nausea and vomiting after your treatment, we encourage you to take your nausea medication.  Take it as often as prescribed.     If you develop nausea and vomiting that is not controlled by your nausea medication, call the clinic. If it is after clinic hours your family physician or the after hours number for the clinic or go to the Emergency Department.   BELOW ARE SYMPTOMS THAT SHOULD BE REPORTED IMMEDIATELY:  *FEVER GREATER THAN 100.5 F  *CHILLS WITH OR WITHOUT FEVER  NAUSEA AND VOMITING THAT IS NOT CONTROLLED WITH YOUR NAUSEA MEDICATION  *UNUSUAL SHORTNESS OF BREATH  *UNUSUAL BRUISING OR BLEEDING  TENDERNESS IN MOUTH AND THROAT WITH OR WITHOUT PRESENCE OF ULCERS  *URINARY PROBLEMS  *BOWEL PROBLEMS  UNUSUAL RASH Items with * indicate a potential emergency and should be followed up as soon as possible.  Feel free to call the clinic you have any questions or concerns. The clinic phone number is [phone].   I have been informed and understand all the instructions given to me. I know to contact the clinic, my physician, or go to the Emergency Department if any problems should occur. I do not have any questions at this time, but understand that I may call the clinic during office hours   should I have any questions or need assistance in obtaining follow up care.    __________________________________________  _____________  __________ Signature of Patient or Authorized Representative            Date                   Time    __________________________________________ Nurse's Signature

## 2015-01-31 NOTE — Patient Instructions (Signed)

## 2015-01-31 NOTE — Progress Notes (Signed)
Charted on infusion encounter

## 2015-01-31 NOTE — Progress Notes (Signed)
[facility]  [phone] [phone]     ID: Robin Franklin DOB: 05/20/1961  MR#: [id number]  CSN#:[id number]  Patient Care Team: Lawrence Fusco, MD as PCP - General (Internal Medicine) Faera Byerly, MD as Consulting Physician (General Surgery) Gustav C Magrinat, MD as Consulting Physician (Oncology) Robert Murray, MD as Consulting Physician (Radiation Oncology) Robert Wein, MD as Consulting Physician (Obstetrics and Gynecology) Jeffrey Jenkins, MD as Consulting Physician (Neurosurgery) Marissa Howard-McNatt, MD as Referring Physician (Surgery) OTHER MD:  CHIEF COMPLAINT: Triple negative breast cancer  CURRENT TREATMENT: Adjuvant chemotherapy  BREAST CANCER HISTORY: From the original intake note:  [name] herself noted a mass in her left axilla sometime in late November or she brought it to Dr. Fusco's attention and on 11/15/2014 she had a left mammogram and left ultrasound at the breast Center. The breast density was category 8. The left breast was entirely unremarkable, with no masses, distortion, or calcifications. In the left axilla there was a mass which was palpable and moderately firm. By ultrasound it measured 3.2 cm. There were no other axillary masses of concern.  Biopsy of the left axillary mass the same day showed ([id number]) a high-grade carcinoma which was estrogen and progesterone receptor negative, HER-2 negative, with a signals ratio of 1.53 and the number per cell of 2.60, with an MIB-1 of 95%. It was also gross cystic disease fluid protein negative, and negative for cytokeratin 20 and TTF-1. It was positive for cytokeratin 7. This was felt to be consistent with a breast primary, but gynecologic and other malignancies were also in the differential.  In general 04/08/2015 the patient had a diagnostic right mammogram, which was negative. On the same day she had bilateral breast MRI. There were no masses or abnormal enhancement in either breast.  In the left axilla there was a round enhancing mass measuring 4.6 cm. There were mildly prominent smaller immediately adjacent lymph nodes. He was no right axillary or internal mammary adenopathy. An incidental lipoma was noted in the left lateral chest wall.  Her subsequent history is as detailed below.  INTERVAL HISTORY:  [name] returns today to the breast clinic for follow-up of her triple negative breast cancer, accompanied by her daughter. Today is day 1, cycle 2 of cyclophosphamide and doxorubicin, with neulasta given on day 2 of treatment for granulocyte support.   REVIEW OF SYSTEMS: Charliene is losing her hair, so she has cut it short and is now wearing a wig. She denies fevers, chills, nausea ,vomiting, or changes in bowel or bladder habit. She had sharp back pain that extended into her arms, likely as the result of her bulging disk. Dr. Jenkins, her orthopedic specialist, does not want to do a whole lot until she completes chemotherapy. Right now she visits a chiropractor every Friday to help her get through the week. She has not been taking her gabapentin as prescribed, but the aleve certainly helps. A detailed review of systems is otherwise stable.  PAST MEDICAL HISTORY: Past Medical History  Diagnosis Date  . Hypothyroidism   . Headache(784.0)   . Anxiety   . Breast cancer   . Arthritis   . Joint pain     PAST SURGICAL HISTORY: Past Surgical History  Procedure Laterality Date  . Breast reduction surgery    . Cervical cone biopsy    . Cyst removed from neck    . Colonoscopy  11/06/2011    Procedure: COLONOSCOPY;  Surgeon: Sandi M Fields, MD;  Location: [facility]   ENDO SUITE;  Service: Endoscopy;  Laterality: N/A;  11:30 AM  . Steriod injection      FAMILY HISTORY Family History  Problem Relation Age of Onset  . Colon cancer Neg Hx   . Breast cancer Mother   the patient's father died from a myocardial infarction at the age of 85. [name] died from breast cancer at the age  of 31. It was diagnosed at age 16. The patient has 2 brothers, one sister. There is no other history of breast or ovarian cancer in the family to her knowledge.  GYNECOLOGIC HISTORY:  Patient's last menstrual period was 07/20/2013 (approximate). Menarche age 17, first live birth age 2. The patient is GX P2. She used oral contraceptives for a few months remotely with no side effects. She stopped having periods in September 2014. 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 [name] is a Merchant navy officer with [employer]. At home it's just the 2 of them, with no pets. Aditi's children from an earlier marriage are [name]. [name], who lives in [place] and is a Public relations account executive; and [name], who lives in [place] and is a Biochemist, clinical. The patient has 4 grandchildren +2 by adoption. She attends a [employer] locally    ADVANCED DIRECTIVES: Not in place   HEALTH MAINTENANCE: History  Substance Use Topics  . Smoking status: Never Smoker   . Smokeless tobacco: Not on file  . Alcohol Use: No     Colonoscopy: 2013/ [facility]  PAP: February 2015  Bone density: Never  Lipid panel:  No Known Allergies  Current Outpatient Prescriptions  Medication Sig Dispense Refill  . ALPRAZolam (XANAX) 0.5 MG tablet Take 0.5 mg by mouth daily as needed for anxiety. For anxiety    . Cyanocobalamin (VITAMIN B 12 PO) Take 1 tablet by mouth daily.    . cyclobenzaprine (FLEXERIL) 10 MG tablet   0  . dexamethasone (DECADRON) 4 MG tablet Take 2 tablets by mouth once a day on the day after chemotherapy and then take 2 tablets two times a day for 2 days. Take with food. 30 tablet 1  . gabapentin (NEURONTIN) 100 MG capsule Take 100 mg by mouth.    Marland Kitchen HYDROcodone-acetaminophen (NORCO/VICODIN) 5-325 MG per tablet     . levothyroxine (SYNTHROID, LEVOTHROID) 75 MCG tablet Take 75 mcg by mouth daily before breakfast.    .  lidocaine-prilocaine (EMLA) cream Apply to affected area once 30 g 3  . LORazepam (ATIVAN) 0.5 MG tablet Take 1 tablet (0.5 mg total) by mouth at bedtime as needed (Nausea or vomiting). 30 tablet 0  . Multiple Vitamin (MULTIVITAMIN WITH MINERALS) TABS tablet Take 1 tablet by mouth daily.    . ondansetron (ZOFRAN) 8 MG tablet Take 1 tablet (8 mg total) by mouth 2 (two) times daily as needed. Start on the third day after chemotherapy. 30 tablet 1  . prochlorperazine (COMPAZINE) 10 MG tablet Take 1 tablet (10 mg total) by mouth every 6 (six) hours as needed (Nausea or vomiting). 30 tablet 1  . ibuprofen (ADVIL,MOTRIN) 200 MG tablet Take 400 mg by mouth every 6 (six) hours as needed for moderate pain.    . Melatonin 3 MG TABS Take 3 mg by mouth at bedtime as needed (Sleep).    . Omega-3 Fatty Acids (FISH OIL) 1000 MG CAPS Take 1 capsule by mouth daily.    Marland Kitchen UNABLE TO  FIND Apply topically 4 (four) times daily. Keto%/Bac2%/Gab5%/Lido5%     No current facility-administered medications for this visit.   Facility-Administered Medications Ordered in Other Visits  Medication Dose Route Frequency Provider Last Rate Last Dose  . alteplase (CATHFLO ACTIVASE) injection 2 mg  2 mg Intracatheter Once PRN Gustav C Magrinat, MD      . sodium chloride 0.9 % injection 10 mL  10 mL Intravenous PRN Gustav C Magrinat, MD   10 mL at 01/31/15 1313    OBJECTIVE: Middle-aged African-American woman in no acute distress Filed Vitals:   01/31/15 1338  BP: 135/78  Pulse: 68  Temp: 98.1 F (36.7 C)  Resp: 18     Body mass index is 27.29 kg/(m^2).    ECOG FS:1 - Symptomatic but completely ambulatory   Skin: warm, dry  HEENT: sclerae anicteric, conjunctivae pink, oropharynx clear. No thrush or mucositis.  Lymph Nodes: No cervical or supraclavicular lymphadenopathy  Lungs: clear to auscultation bilaterally, no rales, wheezes, or rhonci  Heart: regular rate and rhythm  Abdomen: round, soft, non tender, positive bowel  sounds  Musculoskeletal: No focal spinal tenderness, no peripheral edema  Neuro: non focal, well oriented, positive affect  Breasts: deferred  LAB RESULTS:  CMP     Component Value Date/Time   NA 142 01/31/2015 1306   NA 142 01/13/2014 1928   K 4.0 01/31/2015 1306   K 3.5* 01/13/2014 1928   CL 103 01/13/2014 1928   CO2 27 01/31/2015 1306   CO2 25 12/23/2010 1229   GLUCOSE 83 01/31/2015 1306   GLUCOSE 82 01/13/2014 1928   BUN 14.0 01/31/2015 1306   BUN 14 01/13/2014 1928   CREATININE 0.8 01/31/2015 1306   CREATININE 1.00 01/13/2014 1928   CALCIUM 9.7 01/31/2015 1306   CALCIUM 9.1 12/23/2010 1229   PROT 7.3 01/31/2015 1306   ALBUMIN 4.1 01/31/2015 1306   AST 17 01/31/2015 1306   ALT 34 01/31/2015 1306   ALKPHOS 67 01/31/2015 1306   BILITOT 0.23 01/31/2015 1306   GFRNONAA >60 12/23/2010 1229   GFRAA  12/23/2010 1229    >60        The eGFR has been calculated using the MDRD equation. This calculation has not been validated in all clinical situations. eGFR's persistently <60 mL/min signify possible Chronic Kidney Disease.    INo results found for: SPEP, UPEP  Lab Results  Component Value Date   WBC 11.8* 01/31/2015   NEUTROABS 9.1* 01/31/2015   HGB 12.1 01/31/2015   HCT 38.4 01/31/2015   MCV 84.9 01/31/2015   PLT 207 01/31/2015      Chemistry      Component Value Date/Time   NA 142 01/31/2015 1306   NA 142 01/13/2014 1928   K 4.0 01/31/2015 1306   K 3.5* 01/13/2014 1928   CL 103 01/13/2014 1928   CO2 27 01/31/2015 1306   CO2 25 12/23/2010 1229   BUN 14.0 01/31/2015 1306   BUN 14 01/13/2014 1928   CREATININE 0.8 01/31/2015 1306   CREATININE 1.00 01/13/2014 1928      Component Value Date/Time   CALCIUM 9.7 01/31/2015 1306   CALCIUM 9.1 12/23/2010 1229   ALKPHOS 67 01/31/2015 1306   AST 17 01/31/2015 1306   ALT 34 01/31/2015 1306   BILITOT 0.23 01/31/2015 1306       No results found for: LABCA2  No components found for: LABCA125  No  results for input(s): INR in the last 168 hours.  Urinalysis      Component Value Date/Time   COLORURINE YELLOW 01/13/2014 1924   APPEARANCEUR CLEAR 01/13/2014 1924   LABSPEC >1.030* 01/13/2014 1924   PHURINE 5.5 01/13/2014 1924   GLUCOSEU NEGATIVE 01/13/2014 1924   HGBUR NEGATIVE 01/13/2014 1924   BILIRUBINUR NEGATIVE 01/13/2014 1924   KETONESUR NEGATIVE 01/13/2014 1924   PROTEINUR NEGATIVE 01/13/2014 1924   UROBILINOGEN 0.2 01/13/2014 1924   NITRITE NEGATIVE 01/13/2014 1924   LEUKOCYTESUR NEGATIVE 01/13/2014 1924    STUDIES: No results found.  ASSESSMENT: 54 y.o. [place] woman status post biopsy 11/15/2014 of a 4.6 cm left axillary mass showing a poorly differentiated carcinoma, triple negative, with an MIB-1 of 90%.  (1) s/p left axillary lymph node dissection under Dr.Howard-McNatt 12/23/2014 with 1 of 14 lymph nodes positive; TX N1, stage II; repeat prognostic panel pending  (2) adjuvant chemotherapy to start 01/17/2015, consisting of cyclophosphamide and doxorubicin 4 given in dose dense fashion, followed by paclitaxel and carboplatin given weekly 12  (a) neulasta to be given on day 2, via onbody injector  (3) radiation will follow chemotherapy  (4) genetics counseling has been scheduled  PLAN: Fama is doing well today. The labs were reviewed in detail and her ANC has improved to 9.1. She will proceed with cycle 2 of cyclophosphamide and doxorubicin. She will begin both zofran and compazine tonight as a means to avoid the aggressive nausea that she experienced early in the cycle last time.   Maizey will return next week for labs and a nadir visit next week. She understands and agrees with this plan. She knows the goal of treatment in her case is cure. She has been encouraged to call with any issues that might arise before her next visit here.   Heather F Boelter, NP   01/31/2015 2:31 PM 

## 2015-01-31 NOTE — Telephone Encounter (Signed)
Per staff message and POF I have scheduled appts. Advised scheduler of appts. JMW  

## 2015-02-01 ENCOUNTER — Telehealth: Payer: Self-pay | Admitting: *Deleted

## 2015-02-01 NOTE — Telephone Encounter (Signed)
Robin Franklin called to say that she is doing very well with her post-chemo nausea this cycle.  After treatment yesterday, she has some very mild nausea for a few hours, and that subsided completely.  She is feeling great today and is taking Compazine and Ativan as directed.

## 2015-02-02 ENCOUNTER — Ambulatory Visit (HOSPITAL_COMMUNITY): Payer: 59

## 2015-02-07 ENCOUNTER — Encounter: Payer: Self-pay | Admitting: Nurse Practitioner

## 2015-02-07 ENCOUNTER — Other Ambulatory Visit (HOSPITAL_BASED_OUTPATIENT_CLINIC_OR_DEPARTMENT_OTHER): Payer: 59

## 2015-02-07 ENCOUNTER — Encounter: Payer: Self-pay | Admitting: *Deleted

## 2015-02-07 ENCOUNTER — Ambulatory Visit (HOSPITAL_BASED_OUTPATIENT_CLINIC_OR_DEPARTMENT_OTHER): Payer: 59 | Admitting: Nurse Practitioner

## 2015-02-07 VITALS — BP 128/68 | HR 90 | Temp 98.0°F | Resp 18 | Wt 181.8 lb

## 2015-02-07 DIAGNOSIS — Z171 Estrogen receptor negative status [ER-]: Secondary | ICD-10-CM | POA: Diagnosis not present

## 2015-02-07 DIAGNOSIS — C773 Secondary and unspecified malignant neoplasm of axilla and upper limb lymph nodes: Secondary | ICD-10-CM

## 2015-02-07 DIAGNOSIS — C50612 Malignant neoplasm of axillary tail of left female breast: Secondary | ICD-10-CM

## 2015-02-07 DIAGNOSIS — C50912 Malignant neoplasm of unspecified site of left female breast: Secondary | ICD-10-CM

## 2015-02-07 DIAGNOSIS — R2232 Localized swelling, mass and lump, left upper limb: Secondary | ICD-10-CM

## 2015-02-07 LAB — COMPREHENSIVE METABOLIC PANEL (CC13)
ALT: 63 U/L — AB (ref 0–55)
AST: 27 U/L (ref 5–34)
Albumin: 3.6 g/dL (ref 3.5–5.0)
Alkaline Phosphatase: 80 U/L (ref 40–150)
Anion Gap: 11 mEq/L (ref 3–11)
BUN: 12.4 mg/dL (ref 7.0–26.0)
CO2: 26 mEq/L (ref 22–29)
CREATININE: 0.8 mg/dL (ref 0.6–1.1)
Calcium: 8.7 mg/dL (ref 8.4–10.4)
Chloride: 103 mEq/L (ref 98–109)
EGFR: >90 mL/min/{1.73_m2} (ref >90)
Glucose: 151 mg/dl — ABNORMAL HIGH (ref 70–140)
Potassium: 4.1 mEq/L (ref 3.5–5.1)
Sodium: 140 mEq/L (ref 136–145)
Total Protein: 6.5 g/dL (ref 6.4–8.3)

## 2015-02-07 LAB — CBC WITH DIFFERENTIAL/PLATELET
BASO%: 0.8 % (ref 0.0–2.0)
BASOS ABS: 0 10*3/uL (ref 0.0–0.1)
EOS ABS: 0 10*3/uL (ref 0.0–0.5)
EOS%: 0.5 % (ref 0.0–7.0)
HCT: 35.8 % (ref 34.8–46.6)
HEMOGLOBIN: 11.4 g/dL — AB (ref 11.6–15.9)
LYMPH%: 22.4 % (ref 14.0–49.7)
MCH: 27 pg (ref 25.1–34.0)
MCHC: 32 g/dL (ref 31.5–36.0)
MCV: 84.3 fL (ref 79.5–101.0)
MONO#: 0.3 10*3/uL (ref 0.1–0.9)
MONO%: 7.5 % (ref 0.0–14.0)
NEUT#: 2.9 10*3/uL (ref 1.5–6.5)
NEUT%: 68.8 % (ref 38.4–76.8)
PLATELETS: 281 10*3/uL (ref 145–400)
RBC: 4.24 10*6/uL (ref 3.70–5.45)
RDW: 13.2 % (ref 11.2–14.5)
WBC: 4.2 10*3/uL (ref 3.9–10.3)
lymph#: 0.9 10*3/uL (ref 0.9–3.3)

## 2015-02-07 NOTE — Progress Notes (Signed)
[place]  Telephone:(336) [phone] Fax:(336) [phone]    ID: Robin Franklin DOB: 1961/02/06  MR#: [id number]  QZE#:[id number]  Patient Care Team: Robin School, MD as PCP - General (Internal Medicine) Robin Klein, MD as Consulting Physician (General Surgery) Robin Cruel, MD as Consulting Physician (Oncology) Robin Koh, MD as Consulting Physician (Radiation Oncology) Robin Rea, MD as Consulting Physician (Obstetrics and Gynecology) Robin Pies, MD as Consulting Physician (Neurosurgery) Robin Ok, MD as Referring Physician (Surgery) OTHER MD:  CHIEF COMPLAINT: Triple negative breast cancer  CURRENT TREATMENT: Adjuvant chemotherapy  BREAST CANCER HISTORY: From the original intake note:  Robin [place] herself noted a mass in her left axilla sometime in late November or she brought it to Dr. Nolon Franklin attention and on 11/15/2014 she had a left mammogram and left ultrasound at the breast Center. The breast density was category 8. The left breast was entirely unremarkable, with no masses, distortion, or calcifications. In the left axilla there was a mass which was palpable and moderately firm. By ultrasound it measured 3.2 cm. There were no other axillary masses of concern.  Biopsy of the left axillary mass the same day showed ([id number]) a high-grade carcinoma which was estrogen and progesterone receptor negative, HER-2 negative, with a signals ratio of 1.53 and the number per cell of 2.60, with an MIB-1 of 95%. It was also gross cystic disease fluid protein negative, and negative for cytokeratin 20 and TTF-1. It was positive for cytokeratin 7. This was felt to be consistent with a breast primary, but gynecologic and other malignancies were also in the differential.  In general 04/08/2015 the patient had a diagnostic right mammogram, which was negative. On the same day she had bilateral breast MRI. There were no masses or abnormal enhancement in either breast. In  the left axilla there was a round enhancing mass measuring 4.6 cm. There were mildly prominent smaller immediately adjacent lymph nodes. He was no right axillary or internal mammary adenopathy. An incidental lipoma was noted in the left lateral chest wall.  Her subsequent history is as detailed below.  INTERVAL HISTORY:  [place] returns today to the breast clinic for follow-up of her triple negative breast cancer, accompanied by her son. Today is day 8, cycle 2 of cyclophosphamide and doxorubicin, with neulasta given on day 2 of treatment for granulocyte support.   REVIEW OF SYSTEMS: [place] denies fevers or chills. Her nausea was significantly improved from the experience of her first cycle. She is having more frequent stools, but they are not loose or runny. Her appetite is increased because of the steroids, and she has gained a few pounds since starting treatment. Her eyes and nose have been runny this past weekend. Her back pain has been minimal since her chiropractor visit on Friday. She has not needed any gabapentin. She is sleeping well with xanax. A detailed review of systems is otherwise stable.  PAST MEDICAL HISTORY: Past Medical History  Diagnosis Date  . Hypothyroidism   . Headache(784.0)   . Anxiety   . Breast cancer   . Arthritis   . Joint pain     PAST SURGICAL HISTORY: Past Surgical History  Procedure Laterality Date  . Breast reduction surgery    . Cervical cone biopsy    . Cyst removed from neck    . Colonoscopy  11/06/2011    Procedure: COLONOSCOPY;  Surgeon: Robin Peng, MD;  Location: [facility];  Service: Endoscopy;  Laterality: N/A;  11:30 AM  .  Steriod injection      FAMILY HISTORY Family History  Problem Relation Age of Onset  . Colon cancer Neg Hx   . Breast cancer Mother   the patient's father died from a myocardial infarction at the age of 53. [name] died from breast cancer at the age of 43. It was diagnosed at age 35. The patient has 2  brothers, one sister. There is no other history of breast or ovarian cancer in the family to her knowledge.  GYNECOLOGIC HISTORY:  Patient's last menstrual period was 07/20/2013 (approximate). Menarche age 64, first live birth age 30. The patient is GX P2. She used oral contraceptives for a few months remotely with no side effects. She stopped having periods in September 2014. 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 At home it's just the 2 of them, with no pets. Robin Franklin's children from an earlier marriage are [place]. Robin Franklin, who lives in [place] and is a Public relations account executive; and [place], who lives in [place] and is a Biochemist, clinical. The patient has 4 grandchildren +2 by adoption. She attends a [employer] locally    ADVANCED DIRECTIVES: Not in place   HEALTH MAINTENANCE: History  Substance Use Topics  . Smoking status: Never Smoker   . Smokeless tobacco: Not on file  . Alcohol Use: No     Colonoscopy: 2013/ [facility]  PAP: February 2015  Bone density: Never  Lipid panel:  No Known Allergies  Current Outpatient Prescriptions  Medication Sig Dispense Refill  . ALPRAZolam (XANAX) 0.5 MG tablet Take 0.5 mg by mouth daily as needed for anxiety. For anxiety    . Cyanocobalamin (VITAMIN B 12 PO) Take 1 tablet by mouth daily.    Marland Kitchen dexamethasone (DECADRON) 4 MG tablet Take 2 tablets by mouth once a day on the day after chemotherapy and then take 2 tablets two times a day for 2 days. Take with food. 30 tablet 1  . levothyroxine (SYNTHROID, LEVOTHROID) 75 MCG tablet Take 75 mcg by mouth daily before breakfast.    . lidocaine-prilocaine (EMLA) cream Apply to affected area once 30 g 3  . LORazepam (ATIVAN) 0.5 MG tablet Take 1 tablet (0.5 mg total) by mouth at bedtime as needed (Nausea or vomiting). 30 tablet 0  . Multiple Vitamin (MULTIVITAMIN WITH  MINERALS) TABS tablet Take 1 tablet by mouth daily.    . ondansetron (ZOFRAN) 8 MG tablet Take 1 tablet (8 mg total) by mouth 2 (two) times daily as needed. Start on the third day after chemotherapy. 30 tablet 1  . prochlorperazine (COMPAZINE) 10 MG tablet Take 1 tablet (10 mg total) by mouth every 6 (six) hours as needed (Nausea or vomiting). 30 tablet 1  . cyclobenzaprine (FLEXERIL) 10 MG tablet   0  . gabapentin (NEURONTIN) 100 MG capsule Take 100 mg by mouth.    Marland Kitchen HYDROcodone-acetaminophen (NORCO/VICODIN) 5-325 MG per tablet     . ibuprofen (ADVIL,MOTRIN) 200 MG tablet Take 400 mg by mouth every 6 (six) hours as needed for moderate pain.    . Melatonin 3 MG TABS Take 3 mg by mouth at bedtime as needed (Sleep).    . Omega-3 Fatty Acids (FISH OIL) 1000 MG CAPS Take 1 capsule by mouth daily.    Marland Kitchen UNABLE TO FIND Apply topically 4 (four) times daily. Keto%/Bac2%/Gab5%/Lido5%     No  current facility-administered medications for this visit.    OBJECTIVE: Middle-aged African-American woman in no acute distress Filed Vitals:   02/07/15 1503  BP: 128/68  Pulse: 90  Temp: 98 F (36.7 C)  Resp: 18     Body mass index is 28.47 kg/(m^2).    ECOG FS:1 - Symptomatic but completely ambulatory   Sclerae unicteric, pupils equal and reactive Oropharynx clear and moist-- no thrush No cervical or supraclavicular adenopathy Lungs no rales or rhonchi Heart regular rate and rhythm Abd soft, nontender, positive bowel sounds MSK no focal spinal tenderness, no upper extremity lymphedema Neuro: nonfocal, well oriented, appropriate affect Breasts: deferred  LAB RESULTS:  CMP     Component Value Date/Time   NA 142 01/31/2015 1306   NA 142 01/13/2014 1928   K 4.0 01/31/2015 1306   K 3.5* 01/13/2014 1928   CL 103 01/13/2014 1928   CO2 27 01/31/2015 1306   CO2 25 12/23/2010 1229   GLUCOSE 83 01/31/2015 1306   GLUCOSE 82 01/13/2014 1928   BUN 14.0 01/31/2015 1306   BUN 14 01/13/2014 1928    CREATININE 0.8 01/31/2015 1306   CREATININE 1.00 01/13/2014 1928   CALCIUM 9.7 01/31/2015 1306   CALCIUM 9.1 12/23/2010 1229   PROT 7.3 01/31/2015 1306   ALBUMIN 4.1 01/31/2015 1306   AST 17 01/31/2015 1306   ALT 34 01/31/2015 1306   ALKPHOS 67 01/31/2015 1306   BILITOT 0.23 01/31/2015 1306   GFRNONAA >60 12/23/2010 1229   GFRAA  12/23/2010 1229    >60        The eGFR has been calculated using the MDRD equation. This calculation has not been validated in all clinical situations. eGFR's persistently <60 mL/min signify possible Chronic Kidney Disease.    INo results found for: SPEP, UPEP  Lab Results  Component Value Date   WBC 4.2 02/07/2015   NEUTROABS 2.9 02/07/2015   HGB 11.4* 02/07/2015   HCT 35.8 02/07/2015   MCV 84.3 02/07/2015   PLT 281 02/07/2015      Chemistry      Component Value Date/Time   NA 142 01/31/2015 1306   NA 142 01/13/2014 1928   K 4.0 01/31/2015 1306   K 3.5* 01/13/2014 1928   CL 103 01/13/2014 1928   CO2 27 01/31/2015 1306   CO2 25 12/23/2010 1229   BUN 14.0 01/31/2015 1306   BUN 14 01/13/2014 1928   CREATININE 0.8 01/31/2015 1306   CREATININE 1.00 01/13/2014 1928      Component Value Date/Time   CALCIUM 9.7 01/31/2015 1306   CALCIUM 9.1 12/23/2010 1229   ALKPHOS 67 01/31/2015 1306   AST 17 01/31/2015 1306   ALT 34 01/31/2015 1306   BILITOT 0.23 01/31/2015 1306       No results found for: LABCA2  No components found for: LABCA125  No results for input(s): INR in the last 168 hours.  Urinalysis    Component Value Date/Time   COLORURINE YELLOW 01/13/2014 1924   APPEARANCEUR CLEAR 01/13/2014 1924   LABSPEC >1.030* 01/13/2014 1924   PHURINE 5.5 01/13/2014 1924   GLUCOSEU NEGATIVE 01/13/2014 1924   HGBUR NEGATIVE 01/13/2014 1924   BILIRUBINUR NEGATIVE 01/13/2014 1924   KETONESUR NEGATIVE 01/13/2014 1924   PROTEINUR NEGATIVE 01/13/2014 1924   UROBILINOGEN 0.2 01/13/2014 1924   NITRITE NEGATIVE 01/13/2014 1924    LEUKOCYTESUR NEGATIVE 01/13/2014 1924    STUDIES: No results found.  ASSESSMENT: 54 y.o. [place] woman status post biopsy 11/15/2014 of a 4.6 cm  left axillary mass showing a poorly differentiated carcinoma, triple negative, with an MIB-1 of 90%.  (1) s/p left axillary lymph node dissection under Dr.Howard-McNatt 12/23/2014 with 1 of 14 lymph nodes positive; TX N1, stage II; repeat prognostic panel pending  (2) adjuvant chemotherapy to start 01/17/2015, consisting of cyclophosphamide and doxorubicin 4 given in dose dense fashion, followed by paclitaxel and carboplatin given weekly 12  (a) neulasta to be given on day 2, via onbody injector  (3) radiation will follow chemotherapy  (4) genetics counseling has been scheduled  PLAN: Overall this was a good second cycle of chemotherapy for [place]. The labs were reviewed in detail and were entirely stable. She is going to try taking claratin daily for her runny dose and eyes. I believe this to be pollen/allergy related.  Chia will return next week for cycle 3 of cyclophosphamide and doxorubicin next week. She understands and agrees with this plan. She knows the goal of treatment in her case is cure. She has been encouraged to call with any issues that might arise before her next visit here.   Laurie Panda, NP   02/07/2015 3:31 PM

## 2015-02-10 ENCOUNTER — Other Ambulatory Visit: Payer: Self-pay | Admitting: *Deleted

## 2015-02-10 ENCOUNTER — Telehealth: Payer: Self-pay | Admitting: Oncology

## 2015-02-10 NOTE — Telephone Encounter (Signed)
appointments adjusted per 3/24 pof and patient will get a new avs at 3/28 appointment

## 2015-02-14 ENCOUNTER — Encounter: Payer: Self-pay | Admitting: Nurse Practitioner

## 2015-02-14 ENCOUNTER — Ambulatory Visit: Payer: 59

## 2015-02-14 ENCOUNTER — Ambulatory Visit (HOSPITAL_BASED_OUTPATIENT_CLINIC_OR_DEPARTMENT_OTHER): Payer: 59

## 2015-02-14 ENCOUNTER — Ambulatory Visit (HOSPITAL_BASED_OUTPATIENT_CLINIC_OR_DEPARTMENT_OTHER): Payer: 59 | Admitting: Nurse Practitioner

## 2015-02-14 ENCOUNTER — Other Ambulatory Visit (HOSPITAL_BASED_OUTPATIENT_CLINIC_OR_DEPARTMENT_OTHER): Payer: 59

## 2015-02-14 VITALS — BP 113/78 | HR 67 | Temp 97.8°F | Resp 18 | Ht 67.0 in | Wt 182.6 lb

## 2015-02-14 DIAGNOSIS — Z803 Family history of malignant neoplasm of breast: Secondary | ICD-10-CM

## 2015-02-14 DIAGNOSIS — Z95828 Presence of other vascular implants and grafts: Secondary | ICD-10-CM

## 2015-02-14 DIAGNOSIS — Z5111 Encounter for antineoplastic chemotherapy: Secondary | ICD-10-CM | POA: Diagnosis not present

## 2015-02-14 DIAGNOSIS — C50912 Malignant neoplasm of unspecified site of left female breast: Secondary | ICD-10-CM

## 2015-02-14 DIAGNOSIS — C773 Secondary and unspecified malignant neoplasm of axilla and upper limb lymph nodes: Secondary | ICD-10-CM

## 2015-02-14 DIAGNOSIS — C50919 Malignant neoplasm of unspecified site of unspecified female breast: Secondary | ICD-10-CM

## 2015-02-14 DIAGNOSIS — R2232 Localized swelling, mass and lump, left upper limb: Secondary | ICD-10-CM

## 2015-02-14 LAB — CBC WITH DIFFERENTIAL/PLATELET
BASO%: 0.8 % (ref 0.0–2.0)
Basophils Absolute: 0.1 10*3/uL (ref 0.0–0.1)
EOS%: 0.2 % (ref 0.0–7.0)
Eosinophils Absolute: 0 10*3/uL (ref 0.0–0.5)
HCT: 33.1 % — ABNORMAL LOW (ref 34.8–46.6)
HGB: 11 g/dL — ABNORMAL LOW (ref 11.6–15.9)
LYMPH#: 1.6 10*3/uL (ref 0.9–3.3)
LYMPH%: 15.4 % (ref 14.0–49.7)
MCH: 28.2 pg (ref 25.1–34.0)
MCHC: 33.2 g/dL (ref 31.5–36.0)
MCV: 84.9 fL (ref 79.5–101.0)
MONO#: 1 10*3/uL — AB (ref 0.1–0.9)
MONO%: 9.4 % (ref 0.0–14.0)
NEUT%: 74.2 % (ref 38.4–76.8)
NEUTROS ABS: 7.9 10*3/uL — AB (ref 1.5–6.5)
Platelets: 212 10*3/uL (ref 145–400)
RBC: 3.9 10*6/uL (ref 3.70–5.45)
RDW: 15.1 % — AB (ref 11.2–14.5)
WBC: 10.6 10*3/uL — AB (ref 3.9–10.3)

## 2015-02-14 LAB — COMPREHENSIVE METABOLIC PANEL (CC13)
ALT: 29 U/L (ref 0–55)
ANION GAP: 8 meq/L (ref 3–11)
AST: 15 U/L (ref 5–34)
Albumin: 3.7 g/dL (ref 3.5–5.0)
Alkaline Phosphatase: 63 U/L (ref 40–150)
BUN: 14.1 mg/dL (ref 7.0–26.0)
CALCIUM: 9.2 mg/dL (ref 8.4–10.4)
CHLORIDE: 109 meq/L (ref 98–109)
CO2: 24 meq/L (ref 22–29)
CREATININE: 0.7 mg/dL (ref 0.6–1.1)
EGFR: >90 mL/min/{1.73_m2} (ref >90)
Glucose: 99 mg/dl (ref 70–140)
Potassium: 4 mEq/L (ref 3.5–5.1)
SODIUM: 141 meq/L (ref 136–145)
Total Bilirubin: <0.2 mg/dL (ref 0.20–1.20)
Total Protein: 6.5 g/dL (ref 6.4–8.3)

## 2015-02-14 MED ORDER — SODIUM CHLORIDE 0.9 % IV SOLN
Freq: Once | INTRAVENOUS | Status: AC
  Administered 2015-02-14: 15:00:00 via INTRAVENOUS
  Filled 2015-02-14: qty 5

## 2015-02-14 MED ORDER — SODIUM CHLORIDE 0.9 % IJ SOLN
10.0000 mL | INTRAMUSCULAR | Status: DC | PRN
  Administered 2015-02-14: 10 mL
  Filled 2015-02-14: qty 10

## 2015-02-14 MED ORDER — SODIUM CHLORIDE 0.9 % IJ SOLN
10.0000 mL | INTRAMUSCULAR | Status: DC | PRN
  Administered 2015-02-14: 10 mL via INTRAVENOUS
  Filled 2015-02-14: qty 10

## 2015-02-14 MED ORDER — PALONOSETRON HCL INJECTION 0.25 MG/5ML
INTRAVENOUS | Status: AC
  Filled 2015-02-14: qty 5

## 2015-02-14 MED ORDER — HEPARIN SOD (PORK) LOCK FLUSH 100 UNIT/ML IV SOLN
500.0000 [IU] | Freq: Once | INTRAVENOUS | Status: AC | PRN
  Administered 2015-02-14: 500 [IU]
  Filled 2015-02-14: qty 5

## 2015-02-14 MED ORDER — PEGFILGRASTIM 6 MG/0.6ML ~~LOC~~ PSKT
6.0000 mg | PREFILLED_SYRINGE | Freq: Once | SUBCUTANEOUS | Status: AC
  Administered 2015-02-14: 6 mg via SUBCUTANEOUS
  Filled 2015-02-14: qty 0.6

## 2015-02-14 MED ORDER — PALONOSETRON HCL INJECTION 0.25 MG/5ML
0.2500 mg | Freq: Once | INTRAVENOUS | Status: AC
  Administered 2015-02-14: 0.25 mg via INTRAVENOUS

## 2015-02-14 MED ORDER — SODIUM CHLORIDE 0.9 % IV SOLN
600.0000 mg/m2 | Freq: Once | INTRAVENOUS | Status: AC
  Administered 2015-02-14: 1160 mg via INTRAVENOUS
  Filled 2015-02-14: qty 58

## 2015-02-14 MED ORDER — SODIUM CHLORIDE 0.9 % IV SOLN
Freq: Once | INTRAVENOUS | Status: AC
  Administered 2015-02-14: 15:00:00 via INTRAVENOUS

## 2015-02-14 MED ORDER — DOXORUBICIN HCL CHEMO IV INJECTION 2 MG/ML
60.0000 mg/m2 | Freq: Once | INTRAVENOUS | Status: AC
  Administered 2015-02-14: 116 mg via INTRAVENOUS
  Filled 2015-02-14: qty 58

## 2015-02-14 NOTE — Patient Instructions (Signed)
[facility] Discharge Instructions for Patients Receiving Chemotherapy  Today you received the following chemotherapy agents: Adriamycin, Ctoxan.  To help prevent nausea and vomiting after your treatment, we encourage you to take your nausea medication: Compazine 10 mg every 6 hours; Zofran 8 mg as directed.   If you develop nausea and vomiting that is not controlled by your nausea medication, call the clinic.   BELOW ARE SYMPTOMS THAT SHOULD BE REPORTED IMMEDIATELY:  *FEVER GREATER THAN 100.5 F  *CHILLS WITH OR WITHOUT FEVER  NAUSEA AND VOMITING THAT IS NOT CONTROLLED WITH YOUR NAUSEA MEDICATION  *UNUSUAL SHORTNESS OF BREATH  *UNUSUAL BRUISING OR BLEEDING  TENDERNESS IN MOUTH AND THROAT WITH OR WITHOUT PRESENCE OF ULCERS  *URINARY PROBLEMS  *BOWEL PROBLEMS  UNUSUAL RASH Items with * indicate a potential emergency and should be followed up as soon as possible.  Feel free to call the clinic you have any questions or concerns. The clinic phone number is [phone].  Please show the [place] at check-in to the Emergency Department and triage nurse.

## 2015-02-14 NOTE — Progress Notes (Signed)
[facility]  Telephone:(336) [phone] Fax:(336) [phone]    ID: [place] DOB: [birth year]  MR#: [id number]  FYB#:[id number]  Patient Care Team: Redmond School, MD as PCP - General (Internal Medicine) Stark Klein, MD as Consulting Physician (General Surgery) Chauncey Cruel, MD as Consulting Physician (Oncology) Arloa Koh, MD as Consulting Physician (Radiation Oncology) Vania Rea, MD as Consulting Physician (Obstetrics and Gynecology) Newman Pies, MD as Consulting Physician (Neurosurgery) Angelina Ok, MD as Referring Physician (Surgery) OTHER MD:  CHIEF COMPLAINT: Triple negative breast cancer  CURRENT TREATMENT: Adjuvant chemotherapy  BREAST CANCER HISTORY: From the original intake note:  Robin [place] herself noted a mass in her left axilla sometime in late November or she brought it to Dr. Nolon Rod attention and on 11/15/2014 she had a left mammogram and left ultrasound at the breast Center. The breast density was category 8. The left breast was entirely unremarkable, with no masses, distortion, or calcifications. In the left axilla there was a mass which was palpable and moderately firm. By ultrasound it measured 3.2 cm. There were no other axillary masses of concern.  Biopsy of the left axillary mass the same day showed ([id number]) a high-grade carcinoma which was estrogen and progesterone receptor negative, HER-2 negative, with a signals ratio of 1.53 and the number per cell of 2.60, with an MIB-1 of 95%. It was also gross cystic disease fluid protein negative, and negative for cytokeratin 20 and TTF-1. It was positive for cytokeratin 7. This was felt to be consistent with a breast primary, but gynecologic and other malignancies were also in the differential.  In general 04/08/2015 the patient had a diagnostic right mammogram, which was negative. On the same day she had bilateral breast MRI. There were no masses or abnormal enhancement in either breast. In  the left axilla there was a round enhancing mass measuring 4.6 cm. There were mildly prominent smaller immediately adjacent lymph nodes. He was no right axillary or internal mammary adenopathy. An incidental lipoma was noted in the left lateral chest wall.  Her subsequent history is as detailed below.  INTERVAL HISTORY:  [place] returns today to the breast clinic for follow-up of her triple negative breast cancer, accompanied by her husband. Today is day 1, cycle 3 of cyclophosphamide and doxorubicin, with neulasta given on day 2 of treatment for granulocyte support.   REVIEW OF SYSTEMS: [place] denies fevers, chills ,nausea, or vomiting. She is having more frequent stools, but they are not loose or runny. Her appetite is increased because of the steroids, and she has gained a few pounds since starting treatment. Her teeth are sensitive for a day or two after chemo, then resolve. She denies mouth sores or rashes. Her eyes and nose continue to be runny but she has not used Claratin consistently. Her back pain has been minimal since her regular chiropractor visits. She has not needed any gabapentin. She is sleeping well with xanax. She is curious about a scab under her left axilla. A detailed review of systems is otherwise stable.  PAST MEDICAL HISTORY: Past Medical History  Diagnosis Date  . Hypothyroidism   . Headache(784.0)   . Anxiety   . Breast cancer   . Arthritis   . Joint pain     PAST SURGICAL HISTORY: Past Surgical History  Procedure Laterality Date  . Breast reduction surgery    . Cervical cone biopsy    . Cyst removed from neck    . Colonoscopy  11/06/2011    Procedure:  COLONOSCOPY;  Surgeon: Dorothyann Peng, MD;  Location: [facility];  Service: Endoscopy;  Laterality: N/A;  11:30 AM  . Steriod injection      FAMILY HISTORY Family History  Problem Relation Age of Onset  . Colon cancer Neg Hx   . Breast cancer Mother   the patient's father died from a myocardial infarction at  the age of 56. [name] died from breast cancer at the age of 5. It was diagnosed at age 41. The patient has 2 brothers, one sister. There is no other history of breast or ovarian cancer in the family to her knowledge.  GYNECOLOGIC HISTORY:  Patient's last menstrual period was 07/20/2013 (approximate). Menarche age 36, first live birth age 51. The patient is GX P2. She used oral contraceptives for a few months remotely with no side effects. She stopped having periods in September 2014. 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 At home it's just the 2 of them, with no pets. [place] children from an earlier marriage are [place]. [place], who lives in [place] and is a Public relations account executive; and [place], who lives in [place] and is a Biochemist, clinical. The patient has 4 grandchildren +2 by adoption. She attends a [employer] locally    ADVANCED DIRECTIVES: Not in place   HEALTH MAINTENANCE: History  Substance Use Topics  . Smoking status: Never Smoker   . Smokeless tobacco: Not on file  . Alcohol Use: No     Colonoscopy: 2013/ [facility]  PAP: February 2015  Bone density: Never  Lipid panel:  No Known Allergies  Current Outpatient Prescriptions  Medication Sig Dispense Refill  . ALPRAZolam (XANAX) 0.5 MG tablet Take 0.5 mg by mouth daily as needed for anxiety. For anxiety    . Cyanocobalamin (VITAMIN B 12 PO) Take 1 tablet by mouth daily.    Marland Kitchen dexamethasone (DECADRON) 4 MG tablet Take 2 tablets by mouth once a day on the day after chemotherapy and then take 2 tablets two times a day for 2 days. Take with food. 30 tablet 1  . levothyroxine (SYNTHROID, LEVOTHROID) 75 MCG tablet Take 75 mcg by mouth daily before breakfast.    . lidocaine-prilocaine (EMLA) cream Apply to affected area once 30 g 3  . LORazepam (ATIVAN) 0.5 MG tablet Take 1  tablet (0.5 mg total) by mouth at bedtime as needed (Nausea or vomiting). 30 tablet 0  . Multiple Vitamin (MULTIVITAMIN WITH MINERALS) TABS tablet Take 1 tablet by mouth daily.    . Omega-3 Fatty Acids (FISH OIL) 1000 MG CAPS Take 1 capsule by mouth daily.    . ondansetron (ZOFRAN) 8 MG tablet Take 1 tablet (8 mg total) by mouth 2 (two) times daily as needed. Start on the third day after chemotherapy. 30 tablet 1  . prochlorperazine (COMPAZINE) 10 MG tablet Take 1 tablet (10 mg total) by mouth every 6 (six) hours as needed (Nausea or vomiting). 30 tablet 1  . cyclobenzaprine (FLEXERIL) 10 MG tablet   0  . gabapentin (NEURONTIN) 100 MG capsule Take 100 mg by mouth.    Marland Kitchen HYDROcodone-acetaminophen (NORCO/VICODIN) 5-325 MG per tablet     . ibuprofen (ADVIL,MOTRIN) 200 MG tablet Take 400 mg by mouth every 6 (six) hours as needed for moderate pain.    . Melatonin 3 MG TABS Take 3 mg by mouth at  bedtime as needed (Sleep).    Marland Kitchen UNABLE TO FIND Apply topically 4 (four) times daily. Keto%/Bac2%/Gab5%/Lido5%     No current facility-administered medications for this visit.    OBJECTIVE: Middle-aged African-American woman in no acute distress Filed Vitals:   02/14/15 1403  BP: 113/78  Pulse: 67  Temp: 97.8 F (36.6 C)  Resp: 18     Body mass index is 28.59 kg/(m^2).    ECOG FS:1 - Symptomatic but completely ambulatory   Skin: warm, dry  HEENT: sclerae anicteric, conjunctivae pink, oropharynx clear. No thrush or mucositis.  Lymph Nodes: No cervical or supraclavicular lymphadenopathy  Lungs: clear to auscultation bilaterally, no rales, wheezes, or rhonci  Heart: regular rate and rhythm  Abdomen: round, soft, non tender, positive bowel sounds  Musculoskeletal: No focal spinal tenderness, no peripheral edema  Neuro: non focal, well oriented, positive affect  Breasts: right axilla scab, no redness or exudate  LAB RESULTS:  CMP     Component Value Date/Time   NA 141 02/14/2015 1339   NA 142  01/13/2014 1928   K 4.0 02/14/2015 1339   K 3.5* 01/13/2014 1928   CL 103 01/13/2014 1928   CO2 24 02/14/2015 1339   CO2 25 12/23/2010 1229   GLUCOSE 99 02/14/2015 1339   GLUCOSE 82 01/13/2014 1928   BUN 14.1 02/14/2015 1339   BUN 14 01/13/2014 1928   CREATININE 0.7 02/14/2015 1339   CREATININE 1.00 01/13/2014 1928   CALCIUM 9.2 02/14/2015 1339   CALCIUM 9.1 12/23/2010 1229   PROT 6.5 02/14/2015 1339   ALBUMIN 3.7 02/14/2015 1339   AST 15 02/14/2015 1339   ALT 29 02/14/2015 1339   ALKPHOS 63 02/14/2015 1339   BILITOT <0.20 02/14/2015 1339   GFRNONAA >60 12/23/2010 1229   GFRAA  12/23/2010 1229    >60        The eGFR has been calculated using the MDRD equation. This calculation has not been validated in all clinical situations. eGFR's persistently <60 mL/min signify possible Chronic Kidney Disease.    INo results found for: SPEP, UPEP  Lab Results  Component Value Date   WBC 10.6* 02/14/2015   NEUTROABS 7.9* 02/14/2015   HGB 11.0* 02/14/2015   HCT 33.1* 02/14/2015   MCV 84.9 02/14/2015   PLT 212 02/14/2015      Chemistry      Component Value Date/Time   NA 141 02/14/2015 1339   NA 142 01/13/2014 1928   K 4.0 02/14/2015 1339   K 3.5* 01/13/2014 1928   CL 103 01/13/2014 1928   CO2 24 02/14/2015 1339   CO2 25 12/23/2010 1229   BUN 14.1 02/14/2015 1339   BUN 14 01/13/2014 1928   CREATININE 0.7 02/14/2015 1339   CREATININE 1.00 01/13/2014 1928      Component Value Date/Time   CALCIUM 9.2 02/14/2015 1339   CALCIUM 9.1 12/23/2010 1229   ALKPHOS 63 02/14/2015 1339   AST 15 02/14/2015 1339   ALT 29 02/14/2015 1339   BILITOT <0.20 02/14/2015 1339       No results found for: LABCA2  No components found for: LABCA125  No results for input(s): INR in the last 168 hours.  Urinalysis    Component Value Date/Time   COLORURINE YELLOW 01/13/2014 1924   APPEARANCEUR CLEAR 01/13/2014 1924   LABSPEC >1.030* 01/13/2014 1924   PHURINE 5.5 01/13/2014 1924    GLUCOSEU NEGATIVE 01/13/2014 [place] NEGATIVE 01/13/2014 [place] NEGATIVE 01/13/2014 1924   KETONESUR  NEGATIVE 01/13/2014 1924   PROTEINUR NEGATIVE 01/13/2014 1924   UROBILINOGEN 0.2 01/13/2014 1924   NITRITE NEGATIVE 01/13/2014 1924   LEUKOCYTESUR NEGATIVE 01/13/2014 1924    STUDIES: No results found.  ASSESSMENT: 54 y.o. [place] woman status post biopsy 11/15/2014 of a 4.6 cm left axillary mass showing a poorly differentiated carcinoma, triple negative, with an MIB-1 of 90%.  (1) s/p left axillary lymph node dissection under Dr.Howard-McNatt 12/23/2014 with 1 of 14 lymph nodes positive; [place] N1, stage II; repeat prognostic panel pending  (2) adjuvant chemotherapy to start 01/17/2015, consisting of cyclophosphamide and doxorubicin 4 given in dose dense fashion, followed by paclitaxel and carboplatin given weekly 12  (a) neulasta to be given on day 2, via onbody injector  (3) radiation will follow chemotherapy  (4) genetics counseling has been scheduled  PLAN: [place] is doing well today. The labs were reviewed in detail and were entirely stable. She will proceed with cycle 3 of cyclophosphamide and doxorubicin as planned today.  The scab under her left axilla is going to be related to her drain removal, but she will continue to observe this area for changes.   [place] will return next week for labs and a nadir visit. She understands and agrees with this plan. She knows the goal of treatment in her case is cure. She has been encouraged to call with any issues that might arise before her next visit here.   Robin Panda, NP   02/14/2015 2:50 PM

## 2015-02-14 NOTE — Patient Instructions (Signed)

## 2015-02-15 ENCOUNTER — Ambulatory Visit (HOSPITAL_COMMUNITY): Payer: 59

## 2015-02-21 ENCOUNTER — Other Ambulatory Visit (HOSPITAL_BASED_OUTPATIENT_CLINIC_OR_DEPARTMENT_OTHER): Payer: 59

## 2015-02-21 ENCOUNTER — Encounter: Payer: Self-pay | Admitting: Nurse Practitioner

## 2015-02-21 ENCOUNTER — Ambulatory Visit (HOSPITAL_BASED_OUTPATIENT_CLINIC_OR_DEPARTMENT_OTHER): Payer: 59 | Admitting: Nurse Practitioner

## 2015-02-21 ENCOUNTER — Telehealth: Payer: Self-pay | Admitting: Nurse Practitioner

## 2015-02-21 VITALS — BP 107/71 | HR 76 | Temp 98.3°F | Resp 18 | Ht 67.0 in | Wt 187.0 lb

## 2015-02-21 DIAGNOSIS — Z171 Estrogen receptor negative status [ER-]: Secondary | ICD-10-CM | POA: Diagnosis not present

## 2015-02-21 DIAGNOSIS — C50919 Malignant neoplasm of unspecified site of unspecified female breast: Secondary | ICD-10-CM

## 2015-02-21 DIAGNOSIS — K1231 Oral mucositis (ulcerative) due to antineoplastic therapy: Secondary | ICD-10-CM | POA: Insufficient documentation

## 2015-02-21 DIAGNOSIS — C773 Secondary and unspecified malignant neoplasm of axilla and upper limb lymph nodes: Secondary | ICD-10-CM

## 2015-02-21 DIAGNOSIS — R2232 Localized swelling, mass and lump, left upper limb: Secondary | ICD-10-CM

## 2015-02-21 DIAGNOSIS — C50912 Malignant neoplasm of unspecified site of left female breast: Secondary | ICD-10-CM

## 2015-02-21 DIAGNOSIS — H04203 Unspecified epiphora, bilateral lacrimal glands: Secondary | ICD-10-CM | POA: Insufficient documentation

## 2015-02-21 LAB — COMPREHENSIVE METABOLIC PANEL (CC13)
ALBUMIN: 3.5 g/dL (ref 3.5–5.0)
ALT: 17 U/L (ref 0–55)
AST: 10 U/L (ref 5–34)
Alkaline Phosphatase: 83 U/L (ref 40–150)
Anion Gap: 9 mEq/L (ref 3–11)
BUN: 10.8 mg/dL (ref 7.0–26.0)
CO2: 29 mEq/L (ref 22–29)
Calcium: 8.7 mg/dL (ref 8.4–10.4)
Chloride: 105 mEq/L (ref 98–109)
Creatinine: 0.7 mg/dL (ref 0.6–1.1)
EGFR: >90 mL/min/{1.73_m2} (ref >90)
Glucose: 67 mg/dl — ABNORMAL LOW (ref 70–140)
Potassium: 4.2 mEq/L (ref 3.5–5.1)
SODIUM: 142 meq/L (ref 136–145)
Total Bilirubin: <0.2 mg/dL (ref 0.20–1.20)
Total Protein: 6.2 g/dL — ABNORMAL LOW (ref 6.4–8.3)

## 2015-02-21 LAB — CBC WITH DIFFERENTIAL/PLATELET
BASO%: 0.5 % (ref 0.0–2.0)
Basophils Absolute: 0 10*3/uL (ref 0.0–0.1)
EOS%: 1 % (ref 0.0–7.0)
Eosinophils Absolute: 0.1 10*3/uL (ref 0.0–0.5)
HCT: 32.8 % — ABNORMAL LOW (ref 34.8–46.6)
HGB: 10.6 g/dL — ABNORMAL LOW (ref 11.6–15.9)
LYMPH%: 18.5 % (ref 14.0–49.7)
MCH: 27.2 pg (ref 25.1–34.0)
MCHC: 32.4 g/dL (ref 31.5–36.0)
MCV: 84 fL (ref 79.5–101.0)
MONO#: 0.4 10*3/uL (ref 0.1–0.9)
MONO%: 8 % (ref 0.0–14.0)
NEUT#: 3.9 10*3/uL (ref 1.5–6.5)
NEUT%: 72 % (ref 38.4–76.8)
Platelets: 228 10*3/uL (ref 145–400)
RBC: 3.9 10*6/uL (ref 3.70–5.45)
RDW: 14.2 % (ref 11.2–14.5)
WBC: 5.4 10*3/uL (ref 3.9–10.3)
lymph#: 1 10*3/uL (ref 0.9–3.3)

## 2015-02-21 MED ORDER — TOBRAMYCIN-DEXAMETHASONE 0.3-0.1 % OP SUSP: 1.0000 [drp] | OPHTHALMIC | Status: DC

## 2015-02-21 NOTE — Progress Notes (Signed)
[place]  [phone] [phone]    ID: Robin Franklin DOB: 20-Dec-1960  MR#: [id number]  HDQ#:[id number]  Patient Care Team: Robin School, MD as PCP - General (Internal Medicine) Robin Klein, MD as Consulting Physician (General Surgery) Robin Cruel, MD as Consulting Physician (Oncology) Robin Koh, MD as Consulting Physician (Radiation Oncology) Robin Rea, MD as Consulting Physician (Obstetrics and Gynecology) Robin Pies, MD as Consulting Physician (Neurosurgery) Robin Ok, MD as Referring Physician (Surgery) OTHER MD:  CHIEF COMPLAINT: Triple negative breast cancer  CURRENT TREATMENT: Adjuvant chemotherapy  BREAST CANCER HISTORY: From the original intake note:  Robin Franklin herself noted a mass in her left axilla sometime in late November or she brought it to Dr. Nolon Franklin attention and on 11/15/2014 she had a left mammogram and left ultrasound at the breast Center. The breast density was category 8. The left breast was entirely unremarkable, with no masses, distortion, or calcifications. In the left axilla there was a mass which was palpable and moderately firm. By ultrasound it measured 3.2 cm. There were no other axillary masses of concern.  Biopsy of the left axillary mass the same day showed ([id number]) a high-grade carcinoma which was estrogen and progesterone receptor negative, HER-2 negative, with a signals ratio of 1.53 and the number per cell of 2.60, with an MIB-1 of 95%. It was also gross cystic disease fluid protein negative, and negative for cytokeratin 20 and TTF-1. It was positive for cytokeratin 7. This was felt to be consistent with a breast primary, but gynecologic and other malignancies were also in the differential.  In general 04/08/2015 the patient had a diagnostic right mammogram, which was negative. On the same day she had bilateral breast MRI. There were no masses or abnormal enhancement in either breast. In  the left axilla there was a round enhancing mass measuring 4.6 cm. There were mildly prominent smaller immediately adjacent lymph nodes. He was no right axillary or internal mammary adenopathy. An incidental lipoma was noted in the left lateral chest wall.  Her subsequent history is as detailed below.  INTERVAL HISTORY:  Robin Franklin returns today to the breast clinic for follow-up of her triple negative breast cancer, accompanied by her husband. Today is day 8 cycle 3 of cyclophosphamide and doxorubicin, with neulasta given on day 2 of treatment for granulocyte support.   REVIEW OF SYSTEMS: Robin Franklin denies fevers, chills, nausea, vomiting, or changes in bowel or bladder habits. She is eating and drinking well. She had a singular mouth sore to her inner left cheek for about 24 hrs, but this disappeared on its own. She has had watery eyes daily, despite using claritin consistently. She continues to use aleve for her chronic back pain. She denies shortness of breath, chest pain , cough, or palpitations. She complains of fatigue that worsens every week, but she is able to make it to church every Sunday, which is most important to her. A detailed review of systems is otherwise stable.   PAST MEDICAL HISTORY: Past Medical History  Diagnosis Date  . Hypothyroidism   . Headache(784.0)   . Anxiety   . Breast cancer   . Arthritis   . Joint pain     PAST SURGICAL HISTORY: Past Surgical History  Procedure Laterality Date  . Breast reduction surgery    . Cervical cone biopsy    . Cyst removed from neck    . Colonoscopy  11/06/2011    Procedure: COLONOSCOPY;  Surgeon: Robin Peng, MD;  Location: [facility];  Service: Endoscopy;  Laterality: N/A;  11:30 AM  . Steriod injection      FAMILY HISTORY Family History  Problem Relation Age of Onset  . Colon cancer Neg Hx   . Breast cancer Mother   the patient's father died from a myocardial infarction at the age of 13. [name] died from breast  cancer at the age of 72. It was diagnosed at age 66. The patient has 2 brothers, one sister. There is no other history of breast or ovarian cancer in the family to her knowledge.  GYNECOLOGIC HISTORY:  Patient's last menstrual period was 07/20/2013 (approximate). Menarche age 15, first live birth age 61. The patient is GX P2. She used oral contraceptives for a few months remotely with no side effects. She stopped having periods in September 2014. 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 At home it's just the 2 of them, with no pets. Robin Franklin's children from an earlier marriage are Robin Franklin. Robin Franklin, who lives in [place] and is a Public relations account executive; and Robin Franklin, who lives in [place] and is a Biochemist, clinical. The patient has 4 grandchildren +2 by adoption. She attends a Robin Franklin locally    ADVANCED DIRECTIVES: Not in place   HEALTH MAINTENANCE: History  Substance Use Topics  . Smoking status: Never Smoker   . Smokeless tobacco: Not on file  . Alcohol Use: No     Colonoscopy: 2013/ Robin Franklin  PAP: February 2015  Bone density: Never  Lipid panel:  No Known Allergies  Current Outpatient Prescriptions  Medication Sig Dispense Refill  . Cyanocobalamin (VITAMIN B 12 PO) Take 1 tablet by mouth daily.    Marland Kitchen levothyroxine (SYNTHROID, LEVOTHROID) 75 MCG tablet Take 75 mcg by mouth daily before breakfast.    . Melatonin 3 MG TABS Take 3 mg by mouth at bedtime as needed (Sleep).    . Multiple Vitamin (MULTIVITAMIN WITH MINERALS) TABS tablet Take 1 tablet by mouth daily.    . naproxen sodium (ANAPROX) 220 MG tablet Take 220 mg by mouth 2 (two) times daily with a meal.    . Omega-3 Fatty Acids (FISH OIL) 1000 MG CAPS Take 1 capsule by mouth daily.    Marland Kitchen ALPRAZolam (XANAX) 0.5 MG tablet Take 0.5 mg by mouth daily as needed for anxiety. For anxiety    .  cyclobenzaprine (FLEXERIL) 10 MG tablet   0  . dexamethasone (DECADRON) 4 MG tablet Take 2 tablets by mouth once a day on the day after chemotherapy and then take 2 tablets two times a day for 2 days. Take with food. (Patient not taking: Reported on 02/21/2015) 30 tablet 1  . gabapentin (NEURONTIN) 100 MG capsule Take 100 mg by mouth.    Marland Kitchen HYDROcodone-acetaminophen (NORCO/VICODIN) 5-325 MG per tablet     . ibuprofen (ADVIL,MOTRIN) 200 MG tablet Take 400 mg by mouth every 6 (six) hours as needed for moderate pain.    Marland Kitchen lidocaine-prilocaine (EMLA) cream Apply to affected area once (Patient not taking: Reported on 02/21/2015) 30 g 3  . LORazepam (ATIVAN) 0.5 MG tablet Take 1 tablet (0.5 mg total) by mouth at bedtime as needed (Nausea or vomiting). (Patient not taking: Reported on 02/21/2015) 30 tablet 0  . ondansetron (ZOFRAN) 8 MG tablet Take 1 tablet (8 mg total) by mouth 2 (two) times daily as  needed. Start on the third day after chemotherapy. (Patient not taking: Reported on 02/21/2015) 30 tablet 1  . prochlorperazine (COMPAZINE) 10 MG tablet Take 1 tablet (10 mg total) by mouth every 6 (six) hours as needed (Nausea or vomiting). (Patient not taking: Reported on 02/21/2015) 30 tablet 1  . UNABLE TO FIND Apply topically 4 (four) times daily. Keto%/Bac2%/Gab5%/Lido5%     No current facility-administered medications for this visit.    OBJECTIVE: Middle-aged African-American woman in no acute distress Filed Vitals:   02/21/15 1500  BP: 107/71  Pulse: 76  Temp: 98.3 F (36.8 C)  Resp: 18     Body mass index is 29.28 kg/(m^2).    ECOG FS:1 - Symptomatic but completely ambulatory   Sclerae unicteric, pupils equal and reactive Oropharynx clear and moist-- no thrush No cervical or supraclavicular adenopathy Lungs no rales or rhonchi Heart regular rate and rhythm Abd soft, nontender, positive bowel sounds MSK no focal spinal tenderness, no upper extremity lymphedema Neuro: nonfocal, well oriented,  appropriate affect Breasts:deferred  LAB RESULTS:  CMP     Component Value Date/Time   NA 142 02/21/2015 1446   NA 142 01/13/2014 1928   K 4.2 02/21/2015 1446   K 3.5* 01/13/2014 1928   CL 103 01/13/2014 1928   CO2 29 02/21/2015 1446   CO2 25 12/23/2010 1229   GLUCOSE 67* 02/21/2015 1446   GLUCOSE 82 01/13/2014 1928   BUN 10.8 02/21/2015 1446   BUN 14 01/13/2014 1928   CREATININE 0.7 02/21/2015 1446   CREATININE 1.00 01/13/2014 1928   CALCIUM 8.7 02/21/2015 1446   CALCIUM 9.1 12/23/2010 1229   PROT 6.2* 02/21/2015 1446   ALBUMIN 3.5 02/21/2015 1446   AST 10 02/21/2015 1446   ALT 17 02/21/2015 1446   ALKPHOS 83 02/21/2015 1446   BILITOT <0.20 02/21/2015 1446   GFRNONAA >60 12/23/2010 1229   GFRAA  12/23/2010 1229    >60        The eGFR has been calculated using the MDRD equation. This calculation has not been validated in all clinical situations. eGFR's persistently <60 mL/min signify possible Chronic Kidney Disease.    INo results found for: SPEP, UPEP  Lab Results  Component Value Date   WBC 5.4 02/21/2015   NEUTROABS 3.9 02/21/2015   HGB 10.6* 02/21/2015   HCT 32.8* 02/21/2015   MCV 84.0 02/21/2015   PLT 228 02/21/2015      Chemistry      Component Value Date/Time   NA 142 02/21/2015 1446   NA 142 01/13/2014 1928   K 4.2 02/21/2015 1446   K 3.5* 01/13/2014 1928   CL 103 01/13/2014 1928   CO2 29 02/21/2015 1446   CO2 25 12/23/2010 1229   BUN 10.8 02/21/2015 1446   BUN 14 01/13/2014 1928   CREATININE 0.7 02/21/2015 1446   CREATININE 1.00 01/13/2014 1928      Component Value Date/Time   CALCIUM 8.7 02/21/2015 1446   CALCIUM 9.1 12/23/2010 1229   ALKPHOS 83 02/21/2015 1446   AST 10 02/21/2015 1446   ALT 17 02/21/2015 1446   BILITOT <0.20 02/21/2015 1446       No results found for: LABCA2  No components found for: LABCA125  No results for input(s): INR in the last 168 hours.  Urinalysis    Component Value Date/Time   COLORURINE  YELLOW 01/13/2014 1924   APPEARANCEUR CLEAR 01/13/2014 1924   LABSPEC >1.030* 01/13/2014 1924   PHURINE 5.5 01/13/2014 1924   GLUCOSEU  NEGATIVE 01/13/2014 1924   HGBUR NEGATIVE 01/13/2014 1924   BILIRUBINUR NEGATIVE 01/13/2014 1924   KETONESUR NEGATIVE 01/13/2014 1924   PROTEINUR NEGATIVE 01/13/2014 1924   UROBILINOGEN 0.2 01/13/2014 1924   NITRITE NEGATIVE 01/13/2014 1924   LEUKOCYTESUR NEGATIVE 01/13/2014 1924    STUDIES: No results found.  ASSESSMENT: 54 y.o. [place] woman status post biopsy 11/15/2014 of a 4.6 cm left axillary mass showing a poorly differentiated carcinoma, triple negative, with an MIB-1 of 90%.  (1) s/p left axillary lymph node dissection under Dr.Howard-McNatt 12/23/2014 with 1 of 14 lymph nodes positive; TX N1, stage II; repeat prognostic panel pending  (2) adjuvant chemotherapy to start 01/17/2015, consisting of cyclophosphamide and doxorubicin 4 given in dose dense fashion, followed by paclitaxel and carboplatin given weekly 12  (a) neulasta to be given on day 2, via onbody injector  (3) radiation will follow chemotherapy  (4) genetics counseling has been scheduled  PLAN: Robin Franklin continues to manage treatments well. The labs were reviewed in detail and were entirely stable.   We briefly discussed her next regimen, paclitaxel and carboplatin, today, but she will have a full 60 minute visit in 2 weeks to discuss with Dr. Jana Hakim.   I advised she swish and spit with warm salt water should any more mouth sores arise. But so far this has been her only experience with one. I have sent a prescription for tobradex to her pharmacy for her watery eyes.   Robin Franklin will return next week for labs and her final cycle of cyclophosphamide and doxorubicin, with no office visit due to scheduling conflicts. In 2 weeks she will return for a nadir visit. She understands and agrees with this plan. She knows the goal of treatment in her case is cure. She has been encouraged to call  with any issues that might arise before her next visit here.   Robin Panda, NP   02/21/2015 3:37 PM

## 2015-02-21 NOTE — Telephone Encounter (Signed)
Appointments made and avs printed for patient,patient aware i will continue to add appointments and she will view them on mychart

## 2015-02-25 ENCOUNTER — Ambulatory Visit: Payer: 59 | Admitting: Oncology

## 2015-02-25 ENCOUNTER — Other Ambulatory Visit: Payer: 59

## 2015-02-28 ENCOUNTER — Ambulatory Visit (HOSPITAL_BASED_OUTPATIENT_CLINIC_OR_DEPARTMENT_OTHER): Payer: 59

## 2015-02-28 ENCOUNTER — Ambulatory Visit: Payer: 59

## 2015-02-28 ENCOUNTER — Other Ambulatory Visit (HOSPITAL_BASED_OUTPATIENT_CLINIC_OR_DEPARTMENT_OTHER): Payer: 59

## 2015-02-28 ENCOUNTER — Other Ambulatory Visit: Payer: Self-pay | Admitting: *Deleted

## 2015-02-28 DIAGNOSIS — C50912 Malignant neoplasm of unspecified site of left female breast: Secondary | ICD-10-CM

## 2015-02-28 DIAGNOSIS — C50612 Malignant neoplasm of axillary tail of left female breast: Secondary | ICD-10-CM | POA: Diagnosis not present

## 2015-02-28 DIAGNOSIS — Z95828 Presence of other vascular implants and grafts: Secondary | ICD-10-CM

## 2015-02-28 DIAGNOSIS — R2232 Localized swelling, mass and lump, left upper limb: Secondary | ICD-10-CM

## 2015-02-28 DIAGNOSIS — Z5189 Encounter for other specified aftercare: Secondary | ICD-10-CM | POA: Diagnosis not present

## 2015-02-28 DIAGNOSIS — Z5111 Encounter for antineoplastic chemotherapy: Secondary | ICD-10-CM | POA: Diagnosis not present

## 2015-02-28 LAB — COMPREHENSIVE METABOLIC PANEL (CC13)
ALBUMIN: 3.8 g/dL (ref 3.5–5.0)
ALK PHOS: 68 U/L (ref 40–150)
ALT: 22 U/L (ref 0–55)
AST: 17 U/L (ref 5–34)
Anion Gap: 9 mEq/L (ref 3–11)
BUN: 8.3 mg/dL (ref 7.0–26.0)
CALCIUM: 8.6 mg/dL (ref 8.4–10.4)
CO2: 26 mEq/L (ref 22–29)
Chloride: 108 mEq/L (ref 98–109)
Creatinine: 0.7 mg/dL (ref 0.6–1.1)
EGFR: >90 mL/min/{1.73_m2} (ref >90)
Glucose: 106 mg/dl (ref 70–140)
POTASSIUM: 4 meq/L (ref 3.5–5.1)
SODIUM: 143 meq/L (ref 136–145)
TOTAL PROTEIN: 6.7 g/dL (ref 6.4–8.3)

## 2015-02-28 LAB — CBC WITH DIFFERENTIAL/PLATELET
BASO%: 0.7 % (ref 0.0–2.0)
Basophils Absolute: 0.1 10*3/uL (ref 0.0–0.1)
EOS%: 0.1 % (ref 0.0–7.0)
Eosinophils Absolute: 0 10*3/uL (ref 0.0–0.5)
HCT: 32.8 % — ABNORMAL LOW (ref 34.8–46.6)
HGB: 10.9 g/dL — ABNORMAL LOW (ref 11.6–15.9)
LYMPH#: 1.4 10*3/uL (ref 0.9–3.3)
LYMPH%: 9.8 % — ABNORMAL LOW (ref 14.0–49.7)
MCH: 28.4 pg (ref 25.1–34.0)
MCHC: 33.2 g/dL (ref 31.5–36.0)
MCV: 85.4 fL (ref 79.5–101.0)
MONO#: 1.3 10*3/uL — ABNORMAL HIGH (ref 0.1–0.9)
MONO%: 8.9 % (ref 0.0–14.0)
NEUT#: 11.3 10*3/uL — ABNORMAL HIGH (ref 1.5–6.5)
NEUT%: 80.5 % — ABNORMAL HIGH (ref 38.4–76.8)
NRBC: 3 % — AB (ref 0–0)
Platelets: 201 10*3/uL (ref 145–400)
RBC: 3.84 10*6/uL (ref 3.70–5.45)
RDW: 16.8 % — AB (ref 11.2–14.5)
WBC: 14.1 10*3/uL — ABNORMAL HIGH (ref 3.9–10.3)

## 2015-02-28 MED ORDER — PALONOSETRON HCL INJECTION 0.25 MG/5ML
0.2500 mg | Freq: Once | INTRAVENOUS | Status: AC
  Administered 2015-02-28: 0.25 mg via INTRAVENOUS

## 2015-02-28 MED ORDER — SODIUM CHLORIDE 0.9 % IJ SOLN
10.0000 mL | INTRAMUSCULAR | Status: DC | PRN
Start: 2015-02-28 — End: 2015-02-28
  Administered 2015-02-28: 10 mL via INTRAVENOUS
  Filled 2015-02-28: qty 10

## 2015-02-28 MED ORDER — SODIUM CHLORIDE 0.9 % IV SOLN
600.0000 mg/m2 | Freq: Once | INTRAVENOUS | Status: AC
  Administered 2015-02-28: 1160 mg via INTRAVENOUS
  Filled 2015-02-28: qty 58

## 2015-02-28 MED ORDER — SODIUM CHLORIDE 0.9 % IJ SOLN
10.0000 mL | INTRAMUSCULAR | Status: DC | PRN
Start: 2015-02-28 — End: 2015-02-28
  Administered 2015-02-28: 10 mL
  Filled 2015-02-28: qty 10

## 2015-02-28 MED ORDER — PEGFILGRASTIM 6 MG/0.6ML ~~LOC~~ PSKT
6.0000 mg | PREFILLED_SYRINGE | Freq: Once | SUBCUTANEOUS | Status: AC
  Administered 2015-02-28: 6 mg via SUBCUTANEOUS
  Filled 2015-02-28: qty 0.6

## 2015-02-28 MED ORDER — LORAZEPAM 0.5 MG PO TABS: 0.5000 mg | ORAL_TABLET | Freq: Every evening | ORAL | Status: DC | PRN

## 2015-02-28 MED ORDER — PALONOSETRON HCL INJECTION 0.25 MG/5ML
INTRAVENOUS | Status: AC
  Filled 2015-02-28: qty 5

## 2015-02-28 MED ORDER — HEPARIN SOD (PORK) LOCK FLUSH 100 UNIT/ML IV SOLN
500.0000 [IU] | Freq: Once | INTRAVENOUS | Status: AC | PRN
  Administered 2015-02-28: 500 [IU]
  Filled 2015-02-28: qty 5

## 2015-02-28 MED ORDER — SODIUM CHLORIDE 0.9 % IV SOLN
Freq: Once | INTRAVENOUS | Status: AC
  Administered 2015-02-28: 13:00:00 via INTRAVENOUS
  Filled 2015-02-28: qty 5

## 2015-02-28 MED ORDER — DOXORUBICIN HCL CHEMO IV INJECTION 2 MG/ML
60.0000 mg/m2 | Freq: Once | INTRAVENOUS | Status: AC
  Administered 2015-02-28: 116 mg via INTRAVENOUS
  Filled 2015-02-28: qty 58

## 2015-02-28 MED ORDER — SODIUM CHLORIDE 0.9 % IV SOLN
Freq: Once | INTRAVENOUS | Status: AC
  Administered 2015-02-28: 13:00:00 via INTRAVENOUS

## 2015-02-28 NOTE — Patient Instructions (Signed)
[facility] Discharge Instructions for Patients Receiving Chemotherapy  Today you received the following chemotherapy agents: adriamycin, cytoxan  To help prevent nausea and vomiting after your treatment, we encourage you to take your nausea medication.  Take it as often as prescribed.     If you develop nausea and vomiting that is not controlled by your nausea medication, call the clinic. If it is after clinic hours your family physician or the after hours number for the clinic or go to the Emergency Department.   BELOW ARE SYMPTOMS THAT SHOULD BE REPORTED IMMEDIATELY:  *FEVER GREATER THAN 100.5 F  *CHILLS WITH OR WITHOUT FEVER  NAUSEA AND VOMITING THAT IS NOT CONTROLLED WITH YOUR NAUSEA MEDICATION  *UNUSUAL SHORTNESS OF BREATH  *UNUSUAL BRUISING OR BLEEDING  TENDERNESS IN MOUTH AND THROAT WITH OR WITHOUT PRESENCE OF ULCERS  *URINARY PROBLEMS  *BOWEL PROBLEMS  UNUSUAL RASH Items with * indicate a potential emergency and should be followed up as soon as possible.  Feel free to call the clinic you have any questions or concerns. The clinic phone number is [phone].   I have been informed and understand all the instructions given to me. I know to contact the clinic, my physician, or go to the Emergency Department if any problems should occur. I do not have any questions at this time, but understand that I may call the clinic during office hours   should I have any questions or need assistance in obtaining follow up care.    __________________________________________  _____________  __________ Signature of Patient or Authorized Representative            Date                   Time    __________________________________________ Nurse's Signature

## 2015-02-28 NOTE — Patient Instructions (Signed)

## 2015-03-01 ENCOUNTER — Ambulatory Visit (HOSPITAL_COMMUNITY): Payer: 59

## 2015-03-07 ENCOUNTER — Encounter: Payer: Self-pay | Admitting: Nurse Practitioner

## 2015-03-07 ENCOUNTER — Ambulatory Visit (HOSPITAL_BASED_OUTPATIENT_CLINIC_OR_DEPARTMENT_OTHER): Payer: 59 | Admitting: Nurse Practitioner

## 2015-03-07 ENCOUNTER — Other Ambulatory Visit (HOSPITAL_BASED_OUTPATIENT_CLINIC_OR_DEPARTMENT_OTHER): Payer: 59

## 2015-03-07 ENCOUNTER — Other Ambulatory Visit: Payer: 59

## 2015-03-07 VITALS — BP 109/68 | HR 88 | Temp 98.8°F | Resp 18 | Ht 67.0 in | Wt 190.6 lb

## 2015-03-07 DIAGNOSIS — C50912 Malignant neoplasm of unspecified site of left female breast: Secondary | ICD-10-CM

## 2015-03-07 DIAGNOSIS — C773 Secondary and unspecified malignant neoplasm of axilla and upper limb lymph nodes: Secondary | ICD-10-CM | POA: Diagnosis not present

## 2015-03-07 DIAGNOSIS — Z171 Estrogen receptor negative status [ER-]: Secondary | ICD-10-CM

## 2015-03-07 DIAGNOSIS — C50919 Malignant neoplasm of unspecified site of unspecified female breast: Secondary | ICD-10-CM | POA: Diagnosis not present

## 2015-03-07 DIAGNOSIS — R2232 Localized swelling, mass and lump, left upper limb: Secondary | ICD-10-CM

## 2015-03-07 LAB — CBC WITH DIFFERENTIAL/PLATELET
BASO%: 0.3 % (ref 0.0–2.0)
Basophils Absolute: 0 10*3/uL (ref 0.0–0.1)
EOS%: 2 % (ref 0.0–7.0)
Eosinophils Absolute: 0.1 10*3/uL (ref 0.0–0.5)
HCT: 31.1 % — ABNORMAL LOW (ref 34.8–46.6)
HGB: 10.1 g/dL — ABNORMAL LOW (ref 11.6–15.9)
LYMPH%: 19.2 % (ref 14.0–49.7)
MCH: 27.7 pg (ref 25.1–34.0)
MCHC: 32.6 g/dL (ref 31.5–36.0)
MCV: 85.1 fL (ref 79.5–101.0)
MONO#: 0.3 10*3/uL (ref 0.1–0.9)
MONO%: 7.1 % (ref 0.0–14.0)
NEUT#: 2.6 10*3/uL (ref 1.5–6.5)
NEUT%: 71.4 % (ref 38.4–76.8)
Platelets: 196 10*3/uL (ref 145–400)
RBC: 3.65 10*6/uL — AB (ref 3.70–5.45)
RDW: 16.3 % — ABNORMAL HIGH (ref 11.2–14.5)
WBC: 3.6 10*3/uL — AB (ref 3.9–10.3)
lymph#: 0.7 10*3/uL — ABNORMAL LOW (ref 0.9–3.3)

## 2015-03-07 LAB — COMPREHENSIVE METABOLIC PANEL (CC13)
ALT: 13 U/L (ref 0–55)
AST: 9 U/L (ref 5–34)
Albumin: 3.6 g/dL (ref 3.5–5.0)
Alkaline Phosphatase: 77 U/L (ref 40–150)
Anion Gap: 11 mEq/L (ref 3–11)
BUN: 14.1 mg/dL (ref 7.0–26.0)
CO2: 26 mEq/L (ref 22–29)
Calcium: 8.6 mg/dL (ref 8.4–10.4)
Chloride: 104 mEq/L (ref 98–109)
Creatinine: 0.7 mg/dL (ref 0.6–1.1)
GLUCOSE: 122 mg/dL (ref 70–140)
Potassium: 4.1 mEq/L (ref 3.5–5.1)
SODIUM: 140 meq/L (ref 136–145)
Total Protein: 6.1 g/dL — ABNORMAL LOW (ref 6.4–8.3)

## 2015-03-07 NOTE — Progress Notes (Signed)
[place]  [phone] Fax:(336) [phone]    ID: [name] DOB: 22-May-1961  MR#: [id number]  GYK#:[id number]  Patient Care Team: Redmond School, MD as PCP - General (Internal Medicine) Stark Klein, MD as Consulting Physician (General Surgery) Chauncey Cruel, MD as Consulting Physician (Oncology) Arloa Koh, MD as Consulting Physician (Radiation Oncology) Vania Rea, MD as Consulting Physician (Obstetrics and Gynecology) Newman Pies, MD as Consulting Physician (Neurosurgery) Angelina Ok, MD as Referring Physician (Surgery) OTHER MD:  CHIEF COMPLAINT: Triple negative breast cancer  CURRENT TREATMENT: Adjuvant chemotherapy  BREAST CANCER HISTORY: From the original intake note:  Tanji herself noted a mass in her left axilla sometime in late November or she brought it to Dr. Nolon Rod attention and on 11/15/2014 she had a left mammogram and left ultrasound at the breast Center. The breast density was category 8. The left breast was entirely unremarkable, with no masses, distortion, or calcifications. In the left axilla there was a mass which was palpable and moderately firm. By ultrasound it measured 3.2 cm. There were no other axillary masses of concern.  Biopsy of the left axillary mass the same day showed ([id number]) a high-grade carcinoma which was estrogen and progesterone receptor negative, HER-2 negative, with a signals ratio of 1.53 and the number per cell of 2.60, with an MIB-1 of 95%. It was also gross cystic disease fluid protein negative, and negative for cytokeratin 20 and TTF-1. It was positive for cytokeratin 7. This was felt to be consistent with a breast primary, but gynecologic and other malignancies were also in the differential.  In general 04/08/2015 the patient had a diagnostic right mammogram, which was negative. On the same day she had bilateral breast MRI. There were no masses or abnormal enhancement in either breast. In  the left axilla there was a round enhancing mass measuring 4.6 cm. There were mildly prominent smaller immediately adjacent lymph nodes. He was no right axillary or internal mammary adenopathy. An incidental lipoma was noted in the left lateral chest wall.  Her subsequent history is as detailed below.  INTERVAL HISTORY:  Kazoua returns today to the breast clinic for follow-up of her triple negative breast cancer, accompanied by her husband. Today is day 8, cycle 4 of cyclophosphamide and doxorubicin, with neulasta given on day 2 of treatment for granulocyte support.   REVIEW OF SYSTEMS: For the first time, she has experienced the neulasta associated bone aches and pains. She took gabapentin and said that this helped. She had some mild nausea after eating a steak, and she doesn't typically eat red meat. Otherwise she is eating well. Katara denies fevers, chills, nausea, vomiting, or changes in bowel or bladder habits. She denies shortness of breath, chest pain, cough, or palpitations.  A detailed review of systems is otherwise stable.   PAST MEDICAL HISTORY: Past Medical History  Diagnosis Date  . Hypothyroidism   . Headache(784.0)   . Anxiety   . Breast cancer   . Arthritis   . Joint pain     PAST SURGICAL HISTORY: Past Surgical History  Procedure Laterality Date  . Breast reduction surgery    . Cervical cone biopsy    . Cyst removed from neck    . Colonoscopy  11/06/2011    Procedure: COLONOSCOPY;  Surgeon: Dorothyann Peng, MD;  [facility];  Service: Endoscopy;  Laterality: N/A;  11:30 AM  . Steriod injection      FAMILY HISTORY Family History  Problem Relation  Age of Onset  . Colon cancer Neg Hx   . Breast cancer Mother   the patient's father died from a myocardial infarction at the age of 56. [name] died from breast cancer at the age of 10. It was diagnosed at age 58. The patient has 2 brothers, one sister. There is no other history of breast or ovarian  cancer in the family to her knowledge.  GYNECOLOGIC HISTORY:  Patient's last menstrual period was 07/20/2013 (approximate). Menarche age 54, first live birth age 54. The patient is GX P2. She used oral contraceptives for a few months remotely with no side effects. She stopped having periods in September 2014. 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 [name] is a Merchant navy officer with [employer]. At home it's just the 2 of them, with no pets. Mignonne's children from an earlier marriage are [name]. [name], who lives in [place] and is a Public relations account executive; and [name], who lives in [place] and is a Biochemist, clinical. The patient has 4 grandchildren +2 by adoption. She attends a [employer] locally    ADVANCED DIRECTIVES: Not in place   HEALTH MAINTENANCE: History  Substance Use Topics  . Smoking status: Never Smoker   . Smokeless tobacco: Not on file  . Alcohol Use: No     Colonoscopy: 2013/ Re[facility]  PAP: February 2015  Bone density: Never  Lipid panel:  No Known Allergies  Current Outpatient Prescriptions  Medication Sig Dispense Refill  . ALPRAZolam (XANAX) 0.5 MG tablet Take 0.5 mg by mouth daily as needed for anxiety. For anxiety    . Cyanocobalamin (VITAMIN B 12 PO) Take 1 tablet by mouth daily.    . cyclobenzaprine (FLEXERIL) 10 MG tablet   0  . dexamethasone (DECADRON) 4 MG tablet Take 2 tablets by mouth once a day on the day after chemotherapy and then take 2 tablets two times a day for 2 days. Take with food. (Patient not taking: Reported on 02/21/2015) 30 tablet 1  . gabapentin (NEURONTIN) 100 MG capsule Take 100 mg by mouth.    Marland Kitchen HYDROcodone-acetaminophen (NORCO/VICODIN) 5-325 MG per tablet     . ibuprofen (ADVIL,MOTRIN) 200 MG tablet Take 400 mg by mouth every 6 (six) hours as needed for moderate pain.    Marland Kitchen levothyroxine (SYNTHROID, LEVOTHROID) 75 MCG tablet Take 75 mcg by mouth  daily before breakfast.    . lidocaine-prilocaine (EMLA) cream Apply to affected area once (Patient not taking: Reported on 02/21/2015) 30 g 3  . LORazepam (ATIVAN) 0.5 MG tablet Take 1 tablet (0.5 mg total) by mouth at bedtime as needed (Nausea or vomiting). 30 tablet 0  . Melatonin 3 MG TABS Take 3 mg by mouth at bedtime as needed (Sleep).    . Multiple Vitamin (MULTIVITAMIN WITH MINERALS) TABS tablet Take 1 tablet by mouth daily.    . naproxen sodium (ANAPROX) 220 MG tablet Take 220 mg by mouth 2 (two) times daily with a meal.    . Omega-3 Fatty Acids (FISH OIL) 1000 MG CAPS Take 1 capsule by mouth daily.    . ondansetron (ZOFRAN) 8 MG tablet Take 1 tablet (8 mg total) by mouth 2 (two) times daily as needed. Start on the third day after chemotherapy. (Patient not taking: Reported on 02/21/2015) 30 tablet 1  . prochlorperazine (COMPAZINE) 10 MG tablet Take 1 tablet (10 mg total) by mouth every 6 (six)  hours as needed (Nausea or vomiting). (Patient not taking: Reported on 02/21/2015) 30 tablet 1  . tobramycin-dexamethasone (TOBRADEX) ophthalmic solution Place 1 drop into both eyes every 4 (four) hours while awake. 5 mL 0  . UNABLE TO FIND Apply topically 4 (four) times daily. Keto%/Bac2%/Gab5%/Lido5%     No current facility-administered medications for this visit.    OBJECTIVE: Middle-aged African-American woman in no acute distress Filed Vitals:   03/07/15 1228  BP: 109/68  Pulse: 88  Temp: 98.8 F (37.1 C)  Resp: 18     Body mass index is 29.85 kg/(m^2).    ECOG FS:1 - Symptomatic but completely ambulatory    Skin: warm, dry  HEENT: sclerae anicteric, conjunctivae pink, oropharynx clear. No thrush or mucositis.  Lymph Nodes: No cervical or supraclavicular lymphadenopathy  Lungs: clear to auscultation bilaterally, no rales, wheezes, or rhonci  Heart: regular rate and rhythm  Abdomen: round, soft, non tender, positive bowel sounds  Musculoskeletal: No focal spinal tenderness, no peripheral  edema  Neuro: non focal, well oriented, positive affect  Breasts: deferred  LAB RESULTS:  CMP     Component Value Date/Time   NA 140 03/07/2015 1214   NA 142 01/13/2014 1928   K 4.1 03/07/2015 1214   K 3.5* 01/13/2014 1928   CL 103 01/13/2014 1928   CO2 26 03/07/2015 1214   CO2 25 12/23/2010 1229   GLUCOSE 122 03/07/2015 1214   GLUCOSE 82 01/13/2014 1928   BUN 14.1 03/07/2015 1214   BUN 14 01/13/2014 1928   CREATININE 0.7 03/07/2015 1214   CREATININE 1.00 01/13/2014 1928   CALCIUM 8.6 03/07/2015 1214   CALCIUM 9.1 12/23/2010 1229   PROT 6.1* 03/07/2015 1214   ALBUMIN 3.6 03/07/2015 1214   AST 9 03/07/2015 1214   ALT 13 03/07/2015 1214   ALKPHOS 77 03/07/2015 1214   BILITOT <0.20 03/07/2015 1214   GFRNONAA >60 12/23/2010 1229   GFRAA  12/23/2010 1229    >60        The eGFR has been calculated using the MDRD equation. This calculation has not been validated in all clinical situations. eGFR's persistently <60 mL/min signify possible Chronic Kidney Disease.    INo results found for: SPEP, UPEP  Lab Results  Component Value Date   WBC 3.6* 03/07/2015   NEUTROABS 2.6 03/07/2015   HGB 10.1* 03/07/2015   HCT 31.1* 03/07/2015   MCV 85.1 03/07/2015   PLT 196 03/07/2015      Chemistry      Component Value Date/Time   NA 140 03/07/2015 1214   NA 142 01/13/2014 1928   K 4.1 03/07/2015 1214   K 3.5* 01/13/2014 1928   CL 103 01/13/2014 1928   CO2 26 03/07/2015 1214   CO2 25 12/23/2010 1229   BUN 14.1 03/07/2015 1214   BUN 14 01/13/2014 1928   CREATININE 0.7 03/07/2015 1214   CREATININE 1.00 01/13/2014 1928      Component Value Date/Time   CALCIUM 8.6 03/07/2015 1214   CALCIUM 9.1 12/23/2010 1229   ALKPHOS 77 03/07/2015 1214   AST 9 03/07/2015 1214   ALT 13 03/07/2015 1214   BILITOT <0.20 03/07/2015 1214       No results found for: LABCA2  No components found for: LABCA125  No results for input(s): INR in the last 168 hours.  Urinalysis      Component Value Date/Time   COLORURINE YELLOW 01/13/2014 1924   APPEARANCEUR CLEAR 01/13/2014 1924   LABSPEC >1.030* 01/13/2014 1924  PHURINE 5.5 01/13/2014 1924   GLUCOSEU NEGATIVE 01/13/2014 1924   HGBUR NEGATIVE 01/13/2014 1924   BILIRUBINUR NEGATIVE 01/13/2014 1924   KETONESUR NEGATIVE 01/13/2014 1924   PROTEINUR NEGATIVE 01/13/2014 1924   UROBILINOGEN 0.2 01/13/2014 1924   NITRITE NEGATIVE 01/13/2014 1924   LEUKOCYTESUR NEGATIVE 01/13/2014 1924    STUDIES: No results found.  ASSESSMENT: 54 y.o. [place] woman status post biopsy 11/15/2014 of a 4.6 cm left axillary mass showing a poorly differentiated carcinoma, triple negative, with an MIB-1 of 90%.  (1) s/p left axillary lymph node dissection under Dr.Howard-McNatt 12/23/2014 with 1 of 14 lymph nodes positive; TX N1, stage II; repeat prognostic panel pending  (2) adjuvant chemotherapy to start 01/17/2015, consisting of cyclophosphamide and doxorubicin 4 given in dose dense fashion, followed by paclitaxel and carboplatin given weekly 12  (a) neulasta to be given on day 2, via onbody injector  (3) radiation will follow chemotherapy  (4) genetics counseling has been scheduled  PLAN: Kimaria is doing well today. The labs were reviewed in detail and were entirely stable. She has completed the most difficult part of her chemotherapy treatments.  We spent about 30 minutes discussing her next chemotherapy regimen, paclitaxel and carboplatin weekly x 12. She was made aware of the risks of peripheral neuropathy and a hypersensitivity reaction with her first dose. She will stop the dexamethasone with this regimen and rely on zofran and compazine alone.   Dafina will return next week for labs, a follow up visit, and cycle 1 of paclitaxel and carboplatin. She understands and agrees with this plan. She knows the goal of treatment in her case is cure. She has been encouraged to call with any issues that might arise before her next visit here.   Laurie Panda, NP   03/07/2015 1:49 PM

## 2015-03-09 ENCOUNTER — Encounter: Payer: Self-pay | Admitting: Nurse Practitioner

## 2015-03-09 NOTE — Progress Notes (Signed)
I spoke with the patient and she will pick up forms from ms. Wilma when she gets here on Monday 03/14/15, sign them and get back to me so I can fax for her. I will make a copy for her records also and get back to her.

## 2015-03-14 ENCOUNTER — Encounter: Payer: Self-pay | Admitting: Nurse Practitioner

## 2015-03-14 ENCOUNTER — Ambulatory Visit: Payer: 59

## 2015-03-14 ENCOUNTER — Other Ambulatory Visit: Payer: Self-pay | Admitting: Oncology

## 2015-03-14 ENCOUNTER — Other Ambulatory Visit (HOSPITAL_BASED_OUTPATIENT_CLINIC_OR_DEPARTMENT_OTHER): Payer: 59

## 2015-03-14 ENCOUNTER — Ambulatory Visit (HOSPITAL_BASED_OUTPATIENT_CLINIC_OR_DEPARTMENT_OTHER): Payer: 59 | Admitting: Nurse Practitioner

## 2015-03-14 ENCOUNTER — Ambulatory Visit (HOSPITAL_BASED_OUTPATIENT_CLINIC_OR_DEPARTMENT_OTHER): Payer: 59

## 2015-03-14 ENCOUNTER — Other Ambulatory Visit: Payer: 59

## 2015-03-14 VITALS — BP 111/74 | HR 80 | Temp 98.6°F | Resp 16

## 2015-03-14 VITALS — BP 115/60 | HR 70 | Temp 97.7°F | Resp 18 | Ht 67.0 in | Wt 190.0 lb

## 2015-03-14 DIAGNOSIS — C773 Secondary and unspecified malignant neoplasm of axilla and upper limb lymph nodes: Secondary | ICD-10-CM | POA: Diagnosis not present

## 2015-03-14 DIAGNOSIS — C50612 Malignant neoplasm of axillary tail of left female breast: Secondary | ICD-10-CM

## 2015-03-14 DIAGNOSIS — Z171 Estrogen receptor negative status [ER-]: Secondary | ICD-10-CM

## 2015-03-14 DIAGNOSIS — R2230 Localized swelling, mass and lump, unspecified upper limb: Secondary | ICD-10-CM

## 2015-03-14 DIAGNOSIS — Z95828 Presence of other vascular implants and grafts: Secondary | ICD-10-CM

## 2015-03-14 DIAGNOSIS — Z5111 Encounter for antineoplastic chemotherapy: Secondary | ICD-10-CM | POA: Diagnosis not present

## 2015-03-14 DIAGNOSIS — C50912 Malignant neoplasm of unspecified site of left female breast: Secondary | ICD-10-CM

## 2015-03-14 LAB — COMPREHENSIVE METABOLIC PANEL (CC13)
ALBUMIN: 3.8 g/dL (ref 3.5–5.0)
ALT: 18 U/L (ref 0–55)
ANION GAP: 13 meq/L — AB (ref 3–11)
AST: 16 U/L (ref 5–34)
Alkaline Phosphatase: 63 U/L (ref 40–150)
BUN: 10.7 mg/dL (ref 7.0–26.0)
CHLORIDE: 109 meq/L (ref 98–109)
CO2: 22 mEq/L (ref 22–29)
CREATININE: 0.8 mg/dL (ref 0.6–1.1)
Calcium: 8.9 mg/dL (ref 8.4–10.4)
Glucose: 161 mg/dl — ABNORMAL HIGH (ref 70–140)
Potassium: 4 mEq/L (ref 3.5–5.1)
SODIUM: 144 meq/L (ref 136–145)
Total Bilirubin: <0.2 mg/dL (ref 0.20–1.20)
Total Protein: 6.4 g/dL (ref 6.4–8.3)

## 2015-03-14 LAB — CBC WITH DIFFERENTIAL/PLATELET
BASO%: 0.4 % (ref 0.0–2.0)
Basophils Absolute: 0 10*3/uL (ref 0.0–0.1)
EOS%: 0.3 % (ref 0.0–7.0)
Eosinophils Absolute: 0 10*3/uL (ref 0.0–0.5)
HEMATOCRIT: 31.6 % — AB (ref 34.8–46.6)
HGB: 10.4 g/dL — ABNORMAL LOW (ref 11.6–15.9)
LYMPH%: 12 % — AB (ref 14.0–49.7)
MCH: 28.7 pg (ref 25.1–34.0)
MCHC: 32.9 g/dL (ref 31.5–36.0)
MCV: 87.1 fL (ref 79.5–101.0)
MONO#: 0.6 10*3/uL (ref 0.1–0.9)
MONO%: 6.7 % (ref 0.0–14.0)
NEUT#: 7.5 10*3/uL — ABNORMAL HIGH (ref 1.5–6.5)
NEUT%: 80.6 % — ABNORMAL HIGH (ref 38.4–76.8)
PLATELETS: 207 10*3/uL (ref 145–400)
RBC: 3.63 10*6/uL — ABNORMAL LOW (ref 3.70–5.45)
RDW: 18.5 % — AB (ref 11.2–14.5)
WBC: 9.4 10*3/uL (ref 3.9–10.3)
lymph#: 1.1 10*3/uL (ref 0.9–3.3)

## 2015-03-14 MED ORDER — HEPARIN SOD (PORK) LOCK FLUSH 100 UNIT/ML IV SOLN
500.0000 [IU] | Freq: Once | INTRAVENOUS | Status: AC | PRN
  Administered 2015-03-14: 500 [IU]
  Filled 2015-03-14: qty 5

## 2015-03-14 MED ORDER — DIPHENHYDRAMINE HCL 50 MG/ML IJ SOLN
25.0000 mg | Freq: Once | INTRAMUSCULAR | Status: AC
Start: 2015-03-14 — End: 2015-03-14
  Administered 2015-03-14: 25 mg via INTRAVENOUS

## 2015-03-14 MED ORDER — FAMOTIDINE IN NACL 20-0.9 MG/50ML-% IV SOLN
INTRAVENOUS | Status: AC
  Filled 2015-03-14: qty 50

## 2015-03-14 MED ORDER — SODIUM CHLORIDE 0.9 % IJ SOLN
10.0000 mL | INTRAMUSCULAR | Status: DC | PRN
  Administered 2015-03-14: 10 mL
  Filled 2015-03-14: qty 10

## 2015-03-14 MED ORDER — DEXTROSE 5 % IV SOLN
80.0000 mg/m2 | Freq: Once | INTRAVENOUS | Status: AC
  Administered 2015-03-14: 162 mg via INTRAVENOUS
  Filled 2015-03-14: qty 27

## 2015-03-14 MED ORDER — SODIUM CHLORIDE 0.9 % IJ SOLN
10.0000 mL | INTRAMUSCULAR | Status: DC | PRN
  Administered 2015-03-14: 10 mL via INTRAVENOUS
  Filled 2015-03-14: qty 10

## 2015-03-14 MED ORDER — LIDOCAINE-PRILOCAINE 2.5-2.5 % EX CREA: 1.0000 "application " | TOPICAL_CREAM | CUTANEOUS | Status: DC | PRN

## 2015-03-14 MED ORDER — DIPHENHYDRAMINE HCL 50 MG/ML IJ SOLN
INTRAMUSCULAR | Status: AC
  Filled 2015-03-14: qty 1

## 2015-03-14 MED ORDER — SODIUM CHLORIDE 0.9 % IV SOLN
Freq: Once | INTRAVENOUS | Status: AC
  Administered 2015-03-14: 12:00:00 via INTRAVENOUS

## 2015-03-14 MED ORDER — FAMOTIDINE IN NACL 20-0.9 MG/50ML-% IV SOLN
20.0000 mg | Freq: Once | INTRAVENOUS | Status: AC
  Administered 2015-03-14: 20 mg via INTRAVENOUS

## 2015-03-14 MED ORDER — CARBOPLATIN CHEMO INJECTION 450 MG/45ML
269.6000 mg | Freq: Once | INTRAVENOUS | Status: AC
  Administered 2015-03-14: 270 mg via INTRAVENOUS
  Filled 2015-03-14: qty 27

## 2015-03-14 MED ORDER — DEXAMETHASONE SODIUM PHOSPHATE 100 MG/10ML IJ SOLN
Freq: Once | INTRAMUSCULAR | Status: AC
  Administered 2015-03-14: 12:00:00 via INTRAVENOUS
  Filled 2015-03-14: qty 8

## 2015-03-14 NOTE — Progress Notes (Signed)
[place]  Telephone:(336) [phone] Fax:(336) [phone]    ID: [name] DOB: Sep 26, 1961  MR#: [id number]  HBZ#:[id number]  Patient Care Team: Redmond School, MD as PCP - General (Internal Medicine) Stark Klein, MD as Consulting Physician (General Surgery) Chauncey Cruel, MD as Consulting Physician (Oncology) Arloa Koh, MD as Consulting Physician (Radiation Oncology) Vania Rea, MD as Consulting Physician (Obstetrics and Gynecology) Newman Pies, MD as Consulting Physician (Neurosurgery) Angelina Ok, MD as Referring Physician (Surgery) OTHER MD:  CHIEF COMPLAINT: Triple negative breast cancer  CURRENT TREATMENT: Adjuvant chemotherapy  BREAST CANCER HISTORY: From the original intake note:  Robin Franklin herself noted a mass in her left axilla sometime in late November or she brought it to Dr. Nolon Rod attention and on 11/15/2014 she had a left mammogram and left ultrasound at the breast Center. The breast density was category 8. The left breast was entirely unremarkable, with no masses, distortion, or calcifications. In the left axilla there was a mass which was palpable and moderately firm. By ultrasound it measured 3.2 cm. There were no other axillary masses of concern.  Biopsy of the left axillary mass the same day showed ([id number]) a high-grade carcinoma which was estrogen and progesterone receptor negative, HER-2 negative, with a signals ratio of 1.53 and the number per cell of 2.60, with an MIB-1 of 95%. It was also gross cystic disease fluid protein negative, and negative for cytokeratin 20 and TTF-1. It was positive for cytokeratin 7. This was felt to be consistent with a breast primary, but gynecologic and other malignancies were also in the differential.  In general 04/08/2015 the patient had a diagnostic right mammogram, which was negative. On the same day she had bilateral breast MRI. There were no masses or abnormal enhancement in either breast. In  the left axilla there was a round enhancing mass measuring 4.6 cm. There were mildly prominent smaller immediately adjacent lymph nodes. He was no right axillary or internal mammary adenopathy. An incidental lipoma was noted in the left lateral chest wall.  Her subsequent history is as detailed below.  INTERVAL HISTORY:  Robin Franklin returns today to the breast clinic for follow-up of her triple negative breast cancer, accompanied by her brother and sister. Today she is to begin carboplatin and paclitaxel weekly x 12.  REVIEW OF SYSTEMS: Sunshyne has few complaints this week. Her back and bone pain related to neulasta has resolved. She had left leg cramps in the middle of the night recently, but none since. A detailed review of systems is otherwise entirely negative.   PAST MEDICAL HISTORY: Past Medical History  Diagnosis Date  . Hypothyroidism   . Headache(784.0)   . Anxiety   . Breast cancer   . Arthritis   . Joint pain     PAST SURGICAL HISTORY: Past Surgical History  Procedure Laterality Date  . Breast reduction surgery    . Cervical cone biopsy    . Cyst removed from neck    . Colonoscopy  11/06/2011    Procedure: COLONOSCOPY;  Surgeon: Dorothyann Peng, MD;  Location: [facility];  Service: Endoscopy;  Laterality: N/A;  11:30 AM  . Steriod injection      FAMILY HISTORY Family History  Problem Relation Age of Onset  . Colon cancer Neg Hx   . Breast cancer Mother   the patient's father died from a myocardial infarction at the age of 54. [name] died from breast cancer at the age of 54. It was  diagnosed at age 72. The patient has 2 brothers, one sister. There is no other history of breast or ovarian cancer in the family to her knowledge.  GYNECOLOGIC HISTORY:  Patient's last menstrual period was 07/20/2013 (approximate). Menarche age 54, first live birth age 54. The patient is GX P2. She used oral contraceptives for a few months remotely with no side effects. She stopped  having periods in September 2014. 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 [name] is a Merchant navy officer with [employer]. At home it's just the 2 of them, with no pets. [name]'s children from an earlier marriage are [name]. [name], who lives in [place] and is a Public relations account executive; and [name], who lives in [place] and is a Biochemist, clinical. The patient has 4 grandchildren +2 by adoption. She attends a [employer] locally    ADVANCED DIRECTIVES: Not in place   HEALTH MAINTENANCE: History  Substance Use Topics  . Smoking status: Never Smoker   . Smokeless tobacco: Not on file  . Alcohol Use: No     Colonoscopy: 2013/ [facility]  PAP: February 2015  Bone density: Never  Lipid panel:  No Known Allergies  Current Outpatient Prescriptions  Medication Sig Dispense Refill  . ALPRAZolam (XANAX) 0.5 MG tablet Take 0.5 mg by mouth daily as needed for anxiety. For anxiety    . Cyanocobalamin (VITAMIN B 12 PO) Take 1 tablet by mouth daily.    Marland Kitchen gabapentin (NEURONTIN) 100 MG capsule Take 100 mg by mouth.    Marland Kitchen ibuprofen (ADVIL,MOTRIN) 200 MG tablet Take 400 mg by mouth every 6 (six) hours as needed for moderate pain.    Marland Kitchen levothyroxine (SYNTHROID, LEVOTHROID) 75 MCG tablet Take 75 mcg by mouth daily before breakfast.    . LORazepam (ATIVAN) 0.5 MG tablet Take 1 tablet (0.5 mg total) by mouth at bedtime as needed (Nausea or vomiting). 30 tablet 0  . Melatonin 3 MG TABS Take 3 mg by mouth at bedtime as needed (Sleep).    . Multiple Vitamin (MULTIVITAMIN WITH MINERALS) TABS tablet Take 1 tablet by mouth daily.    . naproxen sodium (ANAPROX) 220 MG tablet Take 220 mg by mouth 2 (two) times daily with a meal.    . Omega-3 Fatty Acids (FISH OIL) 1000 MG CAPS Take 1 capsule by mouth daily.    Marland Kitchen UNABLE TO FIND Apply topically 4 (four) times daily. Keto%/Bac2%/Gab5%/Lido5%    . cyclobenzaprine (FLEXERIL) 10 MG  tablet   0  . HYDROcodone-acetaminophen (NORCO/VICODIN) 5-325 MG per tablet     . lidocaine-prilocaine (EMLA) cream Apply 1 application topically as needed. 30 g 3  . tobramycin-dexamethasone (TOBRADEX) ophthalmic solution Place 1 drop into both eyes every 4 (four) hours while awake. (Patient not taking: Reported on 03/14/2015) 5 mL 0   No current facility-administered medications for this visit.   Facility-Administered Medications Ordered in Other Visits  Medication Dose Route Frequency Provider Last Rate Last Dose  . CARBOplatin (PARAPLATIN) 270 mg in sodium chloride 0.9 % 100 mL chemo infusion  270 mg Intravenous Once Chauncey Cruel, MD      . famotidine (PEPCID) IVPB 20 mg  20 mg Intravenous Once Chauncey Cruel, MD   20 mg at 03/14/15 1234  . heparin lock flush 100 unit/mL  500 Units Intracatheter Once PRN Chauncey Cruel, MD      . PACLitaxel (TAXOL) 162 mg in dextrose  5 % 250 mL chemo infusion (</= 65m/m2)  80 mg/m2 (Treatment Plan Actual) Intravenous Once GChauncey Cruel MD      . sodium chloride 0.9 % injection 10 mL  10 mL Intracatheter PRN GChauncey Cruel MD        OBJECTIVE: Middle-aged African-American woman in no acute distress Filed Vitals:   03/14/15 1108  BP: 115/60  Pulse: 70  Temp: 97.7 F (36.5 C)  Resp: 18     Body mass index is 29.75 kg/(m^2).    ECOG FS:1 - Symptomatic but completely ambulatory   Sclerae unicteric, pupils equal and reactive Oropharynx clear and moist-- no thrush No cervical or supraclavicular adenopathy Lungs no rales or rhonchi Heart regular rate and rhythm Abd soft, nontender, positive bowel sounds MSK no focal spinal tenderness, no upper extremity lymphedema Neuro: nonfocal, well oriented, appropriate affect Breasts: deferred  LAB RESULTS:  CMP     Component Value Date/Time   NA 144 03/14/2015 1032   NA 142 01/13/2014 1928   K 4.0 03/14/2015 1032   K 3.5* 01/13/2014 1928   CL 103 01/13/2014 1928   CO2 22 03/14/2015  1032   CO2 25 12/23/2010 1229   GLUCOSE 161* 03/14/2015 1032   GLUCOSE 82 01/13/2014 1928   BUN 10.7 03/14/2015 1032   BUN 14 01/13/2014 1928   CREATININE 0.8 03/14/2015 1032   CREATININE 1.00 01/13/2014 1928   CALCIUM 8.9 03/14/2015 1032   CALCIUM 9.1 12/23/2010 1229   PROT 6.4 03/14/2015 1032   ALBUMIN 3.8 03/14/2015 1032   AST 16 03/14/2015 1032   ALT 18 03/14/2015 1032   ALKPHOS 63 03/14/2015 1032   BILITOT <0.20 03/14/2015 1032   GFRNONAA >60 12/23/2010 1229   GFRAA  12/23/2010 1229    >60        The eGFR has been calculated using the MDRD equation. This calculation has not been validated in all clinical situations. eGFR's persistently <60 mL/min signify possible Chronic Kidney Disease.    INo results found for: SPEP, UPEP  Lab Results  Component Value Date   WBC 9.4 03/14/2015   NEUTROABS 7.5* 03/14/2015   HGB 10.4* 03/14/2015   HCT 31.6* 03/14/2015   MCV 87.1 03/14/2015   PLT 207 03/14/2015      Chemistry      Component Value Date/Time   NA 144 03/14/2015 1032   NA 142 01/13/2014 1928   K 4.0 03/14/2015 1032   K 3.5* 01/13/2014 1928   CL 103 01/13/2014 1928   CO2 22 03/14/2015 1032   CO2 25 12/23/2010 1229   BUN 10.7 03/14/2015 1032   BUN 14 01/13/2014 1928   CREATININE 0.8 03/14/2015 1032   CREATININE 1.00 01/13/2014 1928      Component Value Date/Time   CALCIUM 8.9 03/14/2015 1032   CALCIUM 9.1 12/23/2010 1229   ALKPHOS 63 03/14/2015 1032   AST 16 03/14/2015 1032   ALT 18 03/14/2015 1032   BILITOT <0.20 03/14/2015 1032       No results found for: LABCA2  No components found for: LABCA125  No results for input(s): INR in the last 168 hours.  Urinalysis    Component Value Date/Time   COLORURINE YELLOW 01/13/2014 1924   APPEARANCEUR CLEAR 01/13/2014 1924   LABSPEC >1.030* 01/13/2014 1924   PHURINE 5.5 01/13/2014 1924   GLUCOSEU NEGATIVE 01/13/2014 1924   HGBUR NEGATIVE 01/13/2014 1924   BILIRUBINUR NEGATIVE 01/13/2014 1924    KETONESUR NEGATIVE 01/13/2014 [place] 01/13/2014  1924   UROBILINOGEN 0.2 01/13/2014 1924   NITRITE NEGATIVE 01/13/2014 1924   LEUKOCYTESUR NEGATIVE 01/13/2014 1924    STUDIES: No results found.  ASSESSMENT: 54 y.o. [place] woman status post biopsy 11/15/2014 of a 4.6 cm left axillary mass showing a poorly differentiated carcinoma, triple negative, with an MIB-1 of 90%.  (1) s/p left axillary lymph node dissection under Dr.Howard-McNatt 12/23/2014 with 1 of 14 lymph nodes positive; TX N1, stage II; repeat prognostic panel pending  (2) adjuvant chemotherapy to start 01/17/2015, consisting of cyclophosphamide and doxorubicin 4 given in dose dense fashion, followed by paclitaxel and carboplatin given weekly 12  (a) neulasta to be given on day 2, via onbody injector  (3) radiation will follow chemotherapy  (4) genetics counseling has been scheduled  PLAN: Malayzia is ready to begin the second half of treatment today. The labs were reviewed in detail and were entirely stable. She will proceed with cycle 1 of paclitaxel and carboplatin as planned today. We briefly reviewed her antiemetic schedule and she understands that she will not be taking any dexamethasone at home for the duration of these treatments. I have refilled her EMLA cream as requested.   Jacquelynne will return next week for labs and cycle 2 of treatment. She understands and agrees with this plan. She knows the goal of treatment in her case is cure. She has been encouraged to call with any issues that might arise before her next visit here.    Laurie Panda, NP   03/14/2015 12:35 PM

## 2015-03-14 NOTE — Patient Instructions (Signed)
[facility] Discharge Instructions for Patients Receiving Chemotherapy  Today you received the following chemotherapy agents: Taxol, carboplatin  To help prevent nausea and vomiting after your treatment, we encourage you to take your nausea medication as prescribed.    If you develop nausea and vomiting that is not controlled by your nausea medication, call the clinic.   BELOW ARE SYMPTOMS THAT SHOULD BE REPORTED IMMEDIATELY:  *FEVER GREATER THAN 100.5 F  *CHILLS WITH OR WITHOUT FEVER  NAUSEA AND VOMITING THAT IS NOT CONTROLLED WITH YOUR NAUSEA MEDICATION  *UNUSUAL SHORTNESS OF BREATH  *UNUSUAL BRUISING OR BLEEDING  TENDERNESS IN MOUTH AND THROAT WITH OR WITHOUT PRESENCE OF ULCERS  *URINARY PROBLEMS  *BOWEL PROBLEMS  UNUSUAL RASH Items with * indicate a potential emergency and should be followed up as soon as possible.  Feel free to call the clinic you have any questions or concerns. The clinic phone number is [phone].  Please show the CHEMO ALERT CARD at check-in to the Emergency Department and triage nurse.   

## 2015-03-14 NOTE — Progress Notes (Signed)
Patient completed first time taxol/carboplatin without any issues or complications. Patient discharged home with AVS.

## 2015-03-14 NOTE — Patient Instructions (Signed)

## 2015-03-15 ENCOUNTER — Telehealth: Payer: Self-pay | Admitting: *Deleted

## 2015-03-15 NOTE — Telephone Encounter (Signed)
Patient has no complaints. Instructed to call clinic if any issues arise. Patient verbalized understanding.

## 2015-03-15 NOTE — Telephone Encounter (Signed)
-----   Message from Christa See, RN sent at 03/14/2015  2:11 PM EDT ----- Regarding: 1st chemo - Dr. Jana Hakim Patient had first time taxol/carbo. MD is Magrinat. Patient did fine with infusion. Patient's phone number is [phone].  Cyril Mourning

## 2015-03-17 ENCOUNTER — Other Ambulatory Visit: Payer: Self-pay | Admitting: Oncology

## 2015-03-17 ENCOUNTER — Encounter: Payer: Self-pay | Admitting: Nurse Practitioner

## 2015-03-17 ENCOUNTER — Telehealth: Payer: Self-pay | Admitting: *Deleted

## 2015-03-17 NOTE — Progress Notes (Signed)
I faxed forms to  [phone] and mailing copy to the patient for her records.

## 2015-03-17 NOTE — Telephone Encounter (Signed)
VM message from pt regarding side effects from [facility] Taxol/carbo treatment. Call back to patient and she states she is having just a bit of nausea and some muscle aches and pains. Reviewed her medication list with. She does have compazine, ativan for nausea and aleve, hydrocodone for pain. Instructed her to use those as directed and to not let symptoms get ahead of her, to take the compazine around the clock for the next few days as well as her pain medications as needed.  Pt. voiced understanding. No other needs identified.

## 2015-03-21 ENCOUNTER — Ambulatory Visit (HOSPITAL_BASED_OUTPATIENT_CLINIC_OR_DEPARTMENT_OTHER): Payer: 59

## 2015-03-21 ENCOUNTER — Other Ambulatory Visit: Payer: 59

## 2015-03-21 ENCOUNTER — Other Ambulatory Visit (HOSPITAL_BASED_OUTPATIENT_CLINIC_OR_DEPARTMENT_OTHER): Payer: 59

## 2015-03-21 ENCOUNTER — Ambulatory Visit (HOSPITAL_BASED_OUTPATIENT_CLINIC_OR_DEPARTMENT_OTHER): Payer: 59 | Admitting: Nurse Practitioner

## 2015-03-21 ENCOUNTER — Ambulatory Visit: Payer: 59

## 2015-03-21 ENCOUNTER — Ambulatory Visit: Payer: 59 | Admitting: Nurse Practitioner

## 2015-03-21 ENCOUNTER — Other Ambulatory Visit: Payer: Self-pay | Admitting: *Deleted

## 2015-03-21 ENCOUNTER — Encounter: Payer: Self-pay | Admitting: Nurse Practitioner

## 2015-03-21 ENCOUNTER — Other Ambulatory Visit: Payer: Self-pay | Admitting: Oncology

## 2015-03-21 VITALS — BP 122/71 | HR 90 | Temp 98.8°F | Resp 18 | Ht 67.0 in | Wt 193.6 lb

## 2015-03-21 DIAGNOSIS — K5900 Constipation, unspecified: Secondary | ICD-10-CM | POA: Insufficient documentation

## 2015-03-21 DIAGNOSIS — M549 Dorsalgia, unspecified: Secondary | ICD-10-CM | POA: Insufficient documentation

## 2015-03-21 DIAGNOSIS — C50612 Malignant neoplasm of axillary tail of left female breast: Secondary | ICD-10-CM

## 2015-03-21 DIAGNOSIS — Z5111 Encounter for antineoplastic chemotherapy: Secondary | ICD-10-CM

## 2015-03-21 DIAGNOSIS — C50912 Malignant neoplasm of unspecified site of left female breast: Secondary | ICD-10-CM

## 2015-03-21 DIAGNOSIS — T451X5A Adverse effect of antineoplastic and immunosuppressive drugs, initial encounter: Secondary | ICD-10-CM | POA: Insufficient documentation

## 2015-03-21 DIAGNOSIS — C773 Secondary and unspecified malignant neoplasm of axilla and upper limb lymph nodes: Secondary | ICD-10-CM | POA: Diagnosis not present

## 2015-03-21 DIAGNOSIS — R110 Nausea: Secondary | ICD-10-CM | POA: Insufficient documentation

## 2015-03-21 DIAGNOSIS — R2230 Localized swelling, mass and lump, unspecified upper limb: Secondary | ICD-10-CM

## 2015-03-21 DIAGNOSIS — M545 Low back pain: Secondary | ICD-10-CM

## 2015-03-21 DIAGNOSIS — Z95828 Presence of other vascular implants and grafts: Secondary | ICD-10-CM

## 2015-03-21 LAB — COMPREHENSIVE METABOLIC PANEL (CC13)
ALT: 21 U/L (ref 0–55)
ANION GAP: 10 meq/L (ref 3–11)
AST: 16 U/L (ref 5–34)
Albumin: 3.8 g/dL (ref 3.5–5.0)
Alkaline Phosphatase: 52 U/L (ref 40–150)
BILIRUBIN TOTAL: 0.24 mg/dL (ref 0.20–1.20)
BUN: 11.1 mg/dL (ref 7.0–26.0)
CALCIUM: 9 mg/dL (ref 8.4–10.4)
CO2: 25 mEq/L (ref 22–29)
Chloride: 109 mEq/L (ref 98–109)
Creatinine: 0.7 mg/dL (ref 0.6–1.1)
EGFR: >90 mL/min/{1.73_m2} (ref >90)
Glucose: 152 mg/dl — ABNORMAL HIGH (ref 70–140)
Potassium: 4 mEq/L (ref 3.5–5.1)
SODIUM: 144 meq/L (ref 136–145)
Total Protein: 6.3 g/dL — ABNORMAL LOW (ref 6.4–8.3)

## 2015-03-21 LAB — CBC WITH DIFFERENTIAL/PLATELET
BASO%: 0.5 % (ref 0.0–2.0)
Basophils Absolute: 0 10*3/uL (ref 0.0–0.1)
EOS%: 1 % (ref 0.0–7.0)
Eosinophils Absolute: 0 10*3/uL (ref 0.0–0.5)
HCT: 30.9 % — ABNORMAL LOW (ref 34.8–46.6)
HGB: 10.1 g/dL — ABNORMAL LOW (ref 11.6–15.9)
LYMPH%: 24.2 % (ref 14.0–49.7)
MCH: 28.1 pg (ref 25.1–34.0)
MCHC: 32.5 g/dL (ref 31.5–36.0)
MCV: 86.4 fL (ref 79.5–101.0)
MONO#: 0.3 10*3/uL (ref 0.1–0.9)
MONO%: 11.3 % (ref 0.0–14.0)
NEUT#: 1.9 10*3/uL (ref 1.5–6.5)
NEUT%: 63 % (ref 38.4–76.8)
Platelets: 294 10*3/uL (ref 145–400)
RBC: 3.58 10*6/uL — ABNORMAL LOW (ref 3.70–5.45)
RDW: 18.7 % — AB (ref 11.2–14.5)
WBC: 3.1 10*3/uL — ABNORMAL LOW (ref 3.9–10.3)
lymph#: 0.7 10*3/uL — ABNORMAL LOW (ref 0.9–3.3)

## 2015-03-21 MED ORDER — DIPHENHYDRAMINE HCL 50 MG/ML IJ SOLN
12.5000 mg | Freq: Once | INTRAMUSCULAR | Status: AC
  Administered 2015-03-21: 12.5 mg via INTRAVENOUS

## 2015-03-21 MED ORDER — FAMOTIDINE IN NACL 20-0.9 MG/50ML-% IV SOLN
20.0000 mg | Freq: Once | INTRAVENOUS | Status: AC
  Administered 2015-03-21: 20 mg via INTRAVENOUS

## 2015-03-21 MED ORDER — HEPARIN SOD (PORK) LOCK FLUSH 100 UNIT/ML IV SOLN
500.0000 [IU] | Freq: Once | INTRAVENOUS | Status: AC | PRN
  Administered 2015-03-21: 500 [IU]
  Filled 2015-03-21: qty 5

## 2015-03-21 MED ORDER — SODIUM CHLORIDE 0.9 % IJ SOLN
10.0000 mL | INTRAMUSCULAR | Status: DC | PRN
  Administered 2015-03-21: 10 mL
  Filled 2015-03-21: qty 10

## 2015-03-21 MED ORDER — SODIUM CHLORIDE 0.9 % IV SOLN
Freq: Once | INTRAVENOUS | Status: AC
  Administered 2015-03-21: 11:00:00 via INTRAVENOUS

## 2015-03-21 MED ORDER — PACLITAXEL CHEMO INJECTION 300 MG/50ML
80.0000 mg/m2 | Freq: Once | INTRAVENOUS | Status: AC
  Administered 2015-03-21: 162 mg via INTRAVENOUS
  Filled 2015-03-21: qty 27

## 2015-03-21 MED ORDER — FAMOTIDINE IN NACL 20-0.9 MG/50ML-% IV SOLN
INTRAVENOUS | Status: AC
  Filled 2015-03-21: qty 50

## 2015-03-21 MED ORDER — SODIUM CHLORIDE 0.9 % IV SOLN
Freq: Once | INTRAVENOUS | Status: AC
  Administered 2015-03-21: 12:00:00 via INTRAVENOUS
  Filled 2015-03-21: qty 8

## 2015-03-21 MED ORDER — DIPHENHYDRAMINE HCL 50 MG/ML IJ SOLN
INTRAMUSCULAR | Status: AC
  Filled 2015-03-21: qty 1

## 2015-03-21 MED ORDER — SODIUM CHLORIDE 0.9 % IJ SOLN
10.0000 mL | INTRAMUSCULAR | Status: DC | PRN
  Administered 2015-03-21: 10 mL via INTRAVENOUS
  Filled 2015-03-21: qty 10

## 2015-03-21 MED ORDER — CARBOPLATIN CHEMO INJECTION 450 MG/45ML
269.6000 mg | Freq: Once | INTRAVENOUS | Status: AC
  Administered 2015-03-21: 270 mg via INTRAVENOUS
  Filled 2015-03-21: qty 27

## 2015-03-21 NOTE — Progress Notes (Signed)
[place]  [phone] [phone]    ID: Robin Franklin DOB: Apr 24, 1961  MR#: [id number]  IZT#:[id number]  Patient Care Team: Redmond School, MD as PCP - General (Internal Medicine) Stark Klein, MD as Consulting Physician (General Surgery) Chauncey Cruel, MD as Consulting Physician (Oncology) Arloa Koh, MD as Consulting Physician (Radiation Oncology) Vania Rea, MD as Consulting Physician (Obstetrics and Gynecology) Newman Pies, MD as Consulting Physician (Neurosurgery) Angelina Ok, MD as Referring Physician (Surgery) OTHER MD:  CHIEF COMPLAINT: Triple negative breast cancer  CURRENT TREATMENT: Adjuvant chemotherapy  BREAST CANCER HISTORY: From the original intake note:  Robin Franklin herself noted a mass in her left axilla sometime in late November or she brought it to Dr. Nolon Rod attention and on 11/15/2014 she had a left mammogram and left ultrasound at the breast Center. The breast density was category 8. The left breast was entirely unremarkable, with no masses, distortion, or calcifications. In the left axilla there was a mass which was palpable and moderately firm. By ultrasound it measured 3.2 cm. There were no other axillary masses of concern.  Biopsy of the left axillary mass the same day showed ([id number] a high-grade carcinoma which was estrogen and progesterone receptor negative, HER-2 negative, with a signals ratio of 1.53 and the number per cell of 2.60, with an MIB-1 of 95%. It was also gross cystic disease fluid protein negative, and negative for cytokeratin 20 and TTF-1. It was positive for cytokeratin 7. This was felt to be consistent with a breast primary, but gynecologic and other malignancies were also in the differential.  In general 04/08/2015 the patient had a diagnostic right mammogram, which was negative. On the same day she had bilateral breast MRI. There were no masses or abnormal enhancement in either breast. In  the left axilla there was a round enhancing mass measuring 4.6 cm. There were mildly prominent smaller immediately adjacent lymph nodes. He was no right axillary or internal mammary adenopathy. An incidental lipoma was noted in the left lateral chest wall.  Her subsequent history is as detailed below.  INTERVAL HISTORY:  Robin Franklin returns today to the breast clinic for follow-up of her triple negative breast cancer, accompanied by her friend. Today she is due for cycle 2 of carboplatin and paclitaxel, given weekly x 12.  REVIEW OF SYSTEMS: Robin Franklin denies fevers or chills, she was more nauseated than usual this week. By the time she got to days 2 and 3, she did not even want to eat. She called the triage line and they advised she take her compazine around the clock, which was helpful. She is constipated and has been using stool softeners, but did see a small streak of blood on the toilet paper when she wiped. She has seen no more of this. She denies mouth sores, rashes, or neuropathy symptoms. Most concerning for her is back, hip, and abdominal pain that occurs in "attacks" and is usually characterized as sharp pains. She does not do anything that she knows of to incite these pains, they seem to be random and she never knows when they are coming on. She took aleve for the back and hip pain, and this was helpful. The abdominal pain has no remedy so far. A detailed review of systems is otherwise stable.   PAST MEDICAL HISTORY: Past Medical History  Diagnosis Date  . Hypothyroidism   . Headache(784.0)   . Anxiety   . Breast cancer   . Arthritis   . Joint pain  PAST SURGICAL HISTORY: Past Surgical History  Procedure Laterality Date  . Breast reduction surgery    . Cervical cone biopsy    . Cyst removed from neck    . Colonoscopy  11/06/2011    Procedure: COLONOSCOPY;  Surgeon: Dorothyann Peng, MD;  Location: [facility];  Service: Endoscopy;  Laterality: N/A;  11:30 AM  . Steriod injection       FAMILY HISTORY Family History  Problem Relation Age of Onset  . Colon cancer Neg Hx   . Breast cancer Mother   the patient's father died from a myocardial infarction at the age of 34. [name] died from breast cancer at the age of 88. It was diagnosed at age 68. The patient has 2 brothers, one sister. There is no other history of breast or ovarian cancer in the family to her knowledge.  GYNECOLOGIC HISTORY:  Patient's last menstrual period was 07/20/2013 (approximate). Menarche age 70, first live birth age 2. The patient is GX P2. She used oral contraceptives for a few months remotely with no side effects. She stopped having periods in September 2014. 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 [name] is a Merchant navy officer with [employer]. At home it's just the 2 of them, with no pets. Robin Franklin's children from an earlier marriage are [name]. [name], who lives in [place] and is a Public relations account executive; and [name], who lives in [place] and is a Biochemist, clinical. The patient has 4 grandchildren +2 by adoption. She attends a [employer] locally    ADVANCED DIRECTIVES: Not in place   HEALTH MAINTENANCE: History  Substance Use Topics  . Smoking status: Never Smoker   . Smokeless tobacco: Not on file  . Alcohol Use: No     Colonoscopy: 2013/ [facility]  PAP: February 2015  Bone density: Never  Lipid panel:  No Known Allergies  Current Outpatient Prescriptions  Medication Sig Dispense Refill  . ALPRAZolam (XANAX) 0.5 MG tablet Take 0.5 mg by mouth daily as needed for anxiety. For anxiety    . Cyanocobalamin (VITAMIN B 12 PO) Take 1 tablet by mouth daily.    Marland Kitchen levothyroxine (SYNTHROID, LEVOTHROID) 75 MCG tablet Take 75 mcg by mouth daily before breakfast.    . lidocaine-prilocaine (EMLA) cream Apply 1 application topically as needed. 30 g 3  . Multiple Vitamin (MULTIVITAMIN WITH  MINERALS) TABS tablet Take 1 tablet by mouth daily.    . naproxen sodium (ANAPROX) 220 MG tablet Take 220 mg by mouth 2 (two) times daily with a meal.    . prochlorperazine (COMPAZINE) 10 MG tablet TAKE 1 TABLET (10 MG TOTAL) BY MOUTH EVERY 6 (SIX) HOURS AS NEEDED (NAUSEA OR VOMITING). 30 tablet 1  . cyclobenzaprine (FLEXERIL) 10 MG tablet   0  . gabapentin (NEURONTIN) 100 MG capsule Take 100 mg by mouth.    Marland Kitchen HYDROcodone-acetaminophen (NORCO/VICODIN) 5-325 MG per tablet     . ibuprofen (ADVIL,MOTRIN) 200 MG tablet Take 400 mg by mouth every 6 (six) hours as needed for moderate pain.    Marland Kitchen LORazepam (ATIVAN) 0.5 MG tablet Take 1 tablet (0.5 mg total) by mouth at bedtime as needed (Nausea or vomiting). (Patient not taking: Reported on 03/21/2015) 30 tablet 0  . Melatonin 3 MG TABS Take 3 mg by mouth at bedtime as needed (Sleep).    . Omega-3 Fatty Acids (FISH OIL) 1000 MG CAPS Take  1 capsule by mouth daily.    Marland Kitchen tobramycin-dexamethasone (TOBRADEX) ophthalmic solution Place 1 drop into both eyes every 4 (four) hours while awake. (Patient not taking: Reported on 03/14/2015) 5 mL 0  . UNABLE TO FIND Apply topically 4 (four) times daily. Keto%/Bac2%/Gab5%/Lido5%     No current facility-administered medications for this visit.   Facility-Administered Medications Ordered in Other Visits  Medication Dose Route Frequency Provider Last Rate Last Dose  . sodium chloride 0.9 % injection 10 mL  10 mL Intravenous PRN Laurie Panda, NP   10 mL at 03/21/15 0929    OBJECTIVE: Middle-aged African-American woman in no acute distress Filed Vitals:   03/21/15 0948  BP: 122/71  Pulse: 90  Temp: 98.8 F (37.1 C)  Resp: 18     Body mass index is 30.31 kg/(m^2).    ECOG FS:1 - Symptomatic but completely ambulatory   Skin: warm, dry  HEENT: sclerae anicteric, conjunctivae pink, oropharynx clear. No thrush or mucositis.  Lymph Nodes: No cervical or supraclavicular lymphadenopathy  Lungs: clear to auscultation  bilaterally, no rales, wheezes, or rhonci  Heart: regular rate and rhythm  Abdomen: round, soft, non tender, positive bowel sounds  Musculoskeletal: No focal spinal tenderness, no peripheral edema  Neuro: non focal, well oriented, positive affect  Breasts: deferred  LAB RESULTS:  CMP     Component Value Date/Time   NA 144 03/21/2015 0912   NA 142 01/13/2014 1928   K 4.0 03/21/2015 0912   K 3.5* 01/13/2014 1928   CL 103 01/13/2014 1928   CO2 25 03/21/2015 0912   CO2 25 12/23/2010 1229   GLUCOSE 152* 03/21/2015 0912   GLUCOSE 82 01/13/2014 1928   BUN 11.1 03/21/2015 0912   BUN 14 01/13/2014 1928   CREATININE 0.7 03/21/2015 0912   CREATININE 1.00 01/13/2014 1928   CALCIUM 9.0 03/21/2015 0912   CALCIUM 9.1 12/23/2010 1229   PROT 6.3* 03/21/2015 0912   ALBUMIN 3.8 03/21/2015 0912   AST 16 03/21/2015 0912   ALT 21 03/21/2015 0912   ALKPHOS 52 03/21/2015 0912   BILITOT 0.24 03/21/2015 0912   GFRNONAA >60 12/23/2010 1229   GFRAA  12/23/2010 1229    >60        The eGFR has been calculated using the MDRD equation. This calculation has not been validated in all clinical situations. eGFR's persistently <60 mL/min signify possible Chronic Kidney Disease.    INo results found for: SPEP, UPEP  Lab Results  Component Value Date   WBC 3.1* 03/21/2015   NEUTROABS 1.9 03/21/2015   HGB 10.1* 03/21/2015   HCT 30.9* 03/21/2015   MCV 86.4 03/21/2015   PLT 294 03/21/2015      Chemistry      Component Value Date/Time   NA 144 03/21/2015 0912   NA 142 01/13/2014 1928   K 4.0 03/21/2015 0912   K 3.5* 01/13/2014 1928   CL 103 01/13/2014 1928   CO2 25 03/21/2015 0912   CO2 25 12/23/2010 1229   BUN 11.1 03/21/2015 0912   BUN 14 01/13/2014 1928   CREATININE 0.7 03/21/2015 0912   CREATININE 1.00 01/13/2014 1928      Component Value Date/Time   CALCIUM 9.0 03/21/2015 0912   CALCIUM 9.1 12/23/2010 1229   ALKPHOS 52 03/21/2015 0912   AST 16 03/21/2015 0912   ALT 21  03/21/2015 0912   BILITOT 0.24 03/21/2015 0912       No results found for: LABCA2  No components found for:  CNGFR432  No results for input(s): INR in the last 168 hours.  Urinalysis    Component Value Date/Time   COLORURINE YELLOW 01/13/2014 1924   APPEARANCEUR CLEAR 01/13/2014 1924   LABSPEC >1.030* 01/13/2014 1924   PHURINE 5.5 01/13/2014 1924   GLUCOSEU NEGATIVE 01/13/2014 1924   HGBUR NEGATIVE 01/13/2014 1924   BILIRUBINUR NEGATIVE 01/13/2014 1924   KETONESUR NEGATIVE 01/13/2014 1924   PROTEINUR NEGATIVE 01/13/2014 1924   UROBILINOGEN 0.2 01/13/2014 1924   NITRITE NEGATIVE 01/13/2014 1924   LEUKOCYTESUR NEGATIVE 01/13/2014 1924    STUDIES: No results found.  ASSESSMENT: 54 y.o. [place] woman status post biopsy 11/15/2014 of a 4.6 cm left axillary mass showing a poorly differentiated carcinoma, triple negative, with an MIB-1 of 90%.  (1) s/p left axillary lymph node dissection under Dr.Howard-McNatt 12/23/2014 with 1 of 14 lymph nodes positive; TX N1, stage II; repeat prognostic panel pending  (2) adjuvant chemotherapy to start 01/17/2015, consisting of cyclophosphamide and doxorubicin 4 given in dose dense fashion, followed by paclitaxel and carboplatin given weekly 12  (a) neulasta to be given on day 2, via onbody injector  (3) radiation will follow chemotherapy  (4) genetics counseling has been scheduled  PLAN: The labs were reviewed in detail and were entirely stable. She will proceed with cycle 2 of paclitaxel and carboplatin as planned today. I have cut the IV benadryl premed dose in half as she said it made her excessively fatigued and almost disoriented. She will add zofran to her antiemetic schedule along with the compazine to better manage her nausea. I am unsure of the reason her abdomen aches, but the back and hip pain could certainly be a result of the paclitaxel which has been known to cause muscle and joint aches. She will continue to manage this with  aleve PRN. I have advised that she add miralax a few times weekly to her routine use of stool softeners to stay regular.   Robin Franklin will return in 1 week for labs, a follow up visit, and cycle 3 of treatment. She understands and agrees with this plan. She knows the goal of treatment in her case is cures. She has been encouraged to call with any issues that might arise before her next visit here.    Laurie Panda, NP   03/21/2015 10:34 AM

## 2015-03-21 NOTE — Patient Instructions (Signed)
[facility] Discharge Instructions for Patients Receiving Chemotherapy  Today you received the following chemotherapy agents Taxol and Carboplatin.  To help prevent nausea and vomiting after your treatment, we encourage you to take your nausea medication as prescribed.   If you develop nausea and vomiting that is not controlled by your nausea medication, call the clinic.   BELOW ARE SYMPTOMS THAT SHOULD BE REPORTED IMMEDIATELY:  *FEVER GREATER THAN 100.5 F  *CHILLS WITH OR WITHOUT FEVER  NAUSEA AND VOMITING THAT IS NOT CONTROLLED WITH YOUR NAUSEA MEDICATION  *UNUSUAL SHORTNESS OF BREATH  *UNUSUAL BRUISING OR BLEEDING  TENDERNESS IN MOUTH AND THROAT WITH OR WITHOUT PRESENCE OF ULCERS  *URINARY PROBLEMS  *BOWEL PROBLEMS  UNUSUAL RASH Items with * indicate a potential emergency and should be followed up as soon as possible.  Feel free to call the clinic you have any questions or concerns. The clinic phone number is [phone].  Please show the CHEMO ALERT CARD at check-in to the Emergency Department and triage nurse.   

## 2015-03-21 NOTE — Patient Instructions (Signed)

## 2015-03-22 ENCOUNTER — Other Ambulatory Visit: Payer: Self-pay | Admitting: *Deleted

## 2015-03-22 ENCOUNTER — Telehealth: Payer: Self-pay | Admitting: *Deleted

## 2015-03-22 ENCOUNTER — Other Ambulatory Visit: Payer: Self-pay | Admitting: Nurse Practitioner

## 2015-03-22 DIAGNOSIS — C50912 Malignant neoplasm of unspecified site of left female breast: Secondary | ICD-10-CM

## 2015-03-22 DIAGNOSIS — E559 Vitamin D deficiency, unspecified: Secondary | ICD-10-CM

## 2015-03-22 LAB — VITAMIN D 25 HYDROXY (VIT D DEFICIENCY, FRACTURES): Vit D, 25-Hydroxy: 17 ng/mL — ABNORMAL LOW (ref 30–100)

## 2015-03-22 NOTE — Telephone Encounter (Signed)
Called pt with lab results. Informed pt that Vit D level is (17L). Pt does not currently take any Vit D. Informed pt she is to start taking OTC Vitamin D3 1000 IU's daily per Gentry Fitz, NP and we will repeat lab test on next visit. Pt verbalized understanding and agreed to the plan. She said she will pick some up this evening at the drugstore. Message to be forwarded to Gentry Fitz, NP.

## 2015-03-28 ENCOUNTER — Other Ambulatory Visit: Payer: 59

## 2015-03-28 ENCOUNTER — Ambulatory Visit: Payer: 59

## 2015-03-28 ENCOUNTER — Encounter: Payer: Self-pay | Admitting: Oncology

## 2015-03-28 ENCOUNTER — Ambulatory Visit (HOSPITAL_BASED_OUTPATIENT_CLINIC_OR_DEPARTMENT_OTHER): Payer: 59 | Admitting: Nurse Practitioner

## 2015-03-28 ENCOUNTER — Ambulatory Visit: Payer: 59 | Admitting: Nurse Practitioner

## 2015-03-28 ENCOUNTER — Other Ambulatory Visit: Payer: Self-pay | Admitting: Oncology

## 2015-03-28 ENCOUNTER — Ambulatory Visit (HOSPITAL_BASED_OUTPATIENT_CLINIC_OR_DEPARTMENT_OTHER): Payer: 59

## 2015-03-28 ENCOUNTER — Other Ambulatory Visit (HOSPITAL_BASED_OUTPATIENT_CLINIC_OR_DEPARTMENT_OTHER): Payer: 59

## 2015-03-28 ENCOUNTER — Encounter: Payer: Self-pay | Admitting: Nurse Practitioner

## 2015-03-28 VITALS — BP 113/72 | HR 82 | Temp 98.4°F | Resp 18 | Ht 67.0 in | Wt 194.2 lb

## 2015-03-28 DIAGNOSIS — C773 Secondary and unspecified malignant neoplasm of axilla and upper limb lymph nodes: Secondary | ICD-10-CM | POA: Diagnosis not present

## 2015-03-28 DIAGNOSIS — K5900 Constipation, unspecified: Secondary | ICD-10-CM

## 2015-03-28 DIAGNOSIS — C50912 Malignant neoplasm of unspecified site of left female breast: Secondary | ICD-10-CM

## 2015-03-28 DIAGNOSIS — Z95828 Presence of other vascular implants and grafts: Secondary | ICD-10-CM

## 2015-03-28 DIAGNOSIS — R2230 Localized swelling, mass and lump, unspecified upper limb: Secondary | ICD-10-CM

## 2015-03-28 DIAGNOSIS — C50612 Malignant neoplasm of axillary tail of left female breast: Secondary | ICD-10-CM

## 2015-03-28 DIAGNOSIS — Z171 Estrogen receptor negative status [ER-]: Secondary | ICD-10-CM | POA: Diagnosis not present

## 2015-03-28 DIAGNOSIS — Z5111 Encounter for antineoplastic chemotherapy: Secondary | ICD-10-CM | POA: Diagnosis not present

## 2015-03-28 LAB — COMPREHENSIVE METABOLIC PANEL (CC13)
ALT: 31 U/L (ref 0–55)
ANION GAP: 10 meq/L (ref 3–11)
AST: 20 U/L (ref 5–34)
Albumin: 4 g/dL (ref 3.5–5.0)
Alkaline Phosphatase: 56 U/L (ref 40–150)
BUN: 11.7 mg/dL (ref 7.0–26.0)
CALCIUM: 8.9 mg/dL (ref 8.4–10.4)
CHLORIDE: 107 meq/L (ref 98–109)
CO2: 23 meq/L (ref 22–29)
Creatinine: 0.9 mg/dL (ref 0.6–1.1)
EGFR: 90 mL/min/{1.73_m2} — AB (ref >90)
Glucose: 142 mg/dl — ABNORMAL HIGH (ref 70–140)
Potassium: 4 mEq/L (ref 3.5–5.1)
Sodium: 140 mEq/L (ref 136–145)
Total Bilirubin: 0.24 mg/dL (ref 0.20–1.20)
Total Protein: 6.6 g/dL (ref 6.4–8.3)

## 2015-03-28 LAB — CBC WITH DIFFERENTIAL/PLATELET
BASO%: 0.6 % (ref 0.0–2.0)
BASOS ABS: 0 10*3/uL (ref 0.0–0.1)
EOS%: 1.5 % (ref 0.0–7.0)
Eosinophils Absolute: 0.1 10*3/uL (ref 0.0–0.5)
HCT: 30.3 % — ABNORMAL LOW (ref 34.8–46.6)
HEMOGLOBIN: 10.3 g/dL — AB (ref 11.6–15.9)
LYMPH%: 17.8 % (ref 14.0–49.7)
MCH: 29.4 pg (ref 25.1–34.0)
MCHC: 34 g/dL (ref 31.5–36.0)
MCV: 86.6 fL (ref 79.5–101.0)
MONO#: 0.5 10*3/uL (ref 0.1–0.9)
MONO%: 9.2 % (ref 0.0–14.0)
NEUT%: 70.9 % (ref 38.4–76.8)
NEUTROS ABS: 3.7 10*3/uL (ref 1.5–6.5)
PLATELETS: 338 10*3/uL (ref 145–400)
RBC: 3.5 10*6/uL — AB (ref 3.70–5.45)
RDW: 18.6 % — AB (ref 11.2–14.5)
WBC: 5.2 10*3/uL (ref 3.9–10.3)
lymph#: 0.9 10*3/uL (ref 0.9–3.3)

## 2015-03-28 MED ORDER — FAMOTIDINE IN NACL 20-0.9 MG/50ML-% IV SOLN
20.0000 mg | Freq: Once | INTRAVENOUS | Status: AC
  Administered 2015-03-28: 20 mg via INTRAVENOUS

## 2015-03-28 MED ORDER — DIPHENHYDRAMINE HCL 50 MG/ML IJ SOLN
INTRAMUSCULAR | Status: AC
  Filled 2015-03-28: qty 1

## 2015-03-28 MED ORDER — SODIUM CHLORIDE 0.9 % IJ SOLN
10.0000 mL | INTRAMUSCULAR | Status: DC | PRN
  Administered 2015-03-28: 10 mL via INTRAVENOUS
  Filled 2015-03-28: qty 10

## 2015-03-28 MED ORDER — PACLITAXEL CHEMO INJECTION 300 MG/50ML
80.0000 mg/m2 | Freq: Once | INTRAVENOUS | Status: AC
  Administered 2015-03-28: 162 mg via INTRAVENOUS
  Filled 2015-03-28: qty 27

## 2015-03-28 MED ORDER — FAMOTIDINE IN NACL 20-0.9 MG/50ML-% IV SOLN
INTRAVENOUS | Status: AC
  Filled 2015-03-28: qty 50

## 2015-03-28 MED ORDER — SODIUM CHLORIDE 0.9 % IJ SOLN
10.0000 mL | INTRAMUSCULAR | Status: DC | PRN
  Administered 2015-03-28: 10 mL
  Filled 2015-03-28: qty 10

## 2015-03-28 MED ORDER — DIPHENHYDRAMINE HCL 50 MG/ML IJ SOLN
12.5000 mg | Freq: Once | INTRAMUSCULAR | Status: AC
  Administered 2015-03-28: 12.5 mg via INTRAVENOUS

## 2015-03-28 MED ORDER — HEPARIN SOD (PORK) LOCK FLUSH 100 UNIT/ML IV SOLN
500.0000 [IU] | Freq: Once | INTRAVENOUS | Status: AC | PRN
  Administered 2015-03-28: 500 [IU]
  Filled 2015-03-28: qty 5

## 2015-03-28 MED ORDER — SODIUM CHLORIDE 0.9 % IV SOLN
Freq: Once | INTRAVENOUS | Status: AC
  Administered 2015-03-28: 14:00:00 via INTRAVENOUS

## 2015-03-28 MED ORDER — SODIUM CHLORIDE 0.9 % IV SOLN
245.2000 mg | Freq: Once | INTRAVENOUS | Status: AC
  Administered 2015-03-28: 250 mg via INTRAVENOUS
  Filled 2015-03-28: qty 25

## 2015-03-28 MED ORDER — SODIUM CHLORIDE 0.9 % IV SOLN
Freq: Once | INTRAVENOUS | Status: AC
  Administered 2015-03-28: 15:00:00 via INTRAVENOUS
  Filled 2015-03-28: qty 8

## 2015-03-28 NOTE — Progress Notes (Signed)
I placed [employer] forms on the desk of nurse for dr. Gwenlyn Perking

## 2015-03-28 NOTE — Patient Instructions (Signed)

## 2015-03-28 NOTE — Patient Instructions (Signed)
[facility] Discharge Instructions for Patients Receiving Chemotherapy  Today you received the following chemotherapy agents Taxol and Carboplatin. To help prevent nausea and vomiting after your treatment, we encourage you to take your nausea medication as directed.  If you develop nausea and vomiting that is not controlled by your nausea medication, call the clinic.   BELOW ARE SYMPTOMS THAT SHOULD BE REPORTED IMMEDIATELY:  *FEVER GREATER THAN 100.5 F  *CHILLS WITH OR WITHOUT FEVER  NAUSEA AND VOMITING THAT IS NOT CONTROLLED WITH YOUR NAUSEA MEDICATION  *UNUSUAL SHORTNESS OF BREATH  *UNUSUAL BRUISING OR BLEEDING  TENDERNESS IN MOUTH AND THROAT WITH OR WITHOUT PRESENCE OF ULCERS  *URINARY PROBLEMS  *BOWEL PROBLEMS  UNUSUAL RASH Items with * indicate a potential emergency and should be followed up as soon as possible.  Feel free to call the clinic you have any questions or concerns. The clinic phone number is [phone].  Please show the CHEMO ALERT CARD at check-in to the Emergency Department and triage nurse.    

## 2015-03-28 NOTE — Progress Notes (Signed)
Robin Franklin  Telephone:(336) [phone] Fax:(336) [phone]    ID: Robin Franklin DOB: [birth year]  MR#: [id number]  UUE#:[id number]  Patient Care Team: Robin School, MD as PCP - General (Internal Medicine) Robin Klein, MD as Consulting Physician (General Surgery) Robin Cruel, MD as Consulting Physician (Oncology) Robin Koh, MD as Consulting Physician (Radiation Oncology) Robin Rea, MD as Consulting Physician (Obstetrics and Gynecology) Robin Pies, MD as Consulting Physician (Neurosurgery) Robin Ok, MD as Referring Physician (Surgery) OTHER MD:  CHIEF COMPLAINT: Triple negative breast cancer  CURRENT TREATMENT: Adjuvant chemotherapy  BREAST CANCER HISTORY: From the original intake note:  Robin Franklin herself noted a mass in her left axilla sometime in late November or she brought it to Dr. Nolon Franklin attention and on 11/15/2014 she had a left mammogram and left ultrasound at the breast Center. The breast density was category 8. The left breast was entirely unremarkable, with no masses, distortion, or calcifications. In the left axilla there was a mass which was palpable and moderately firm. By ultrasound it measured 3.2 cm. There were no other axillary masses of concern.  Biopsy of the left axillary mass the same day showed ([id number]) a high-grade carcinoma which was estrogen and progesterone receptor negative, HER-2 negative, with a signals ratio of 1.53 and the number per cell of 2.60, with an MIB-1 of 95%. It was also gross cystic disease fluid protein negative, and negative for cytokeratin 20 and TTF-1. It was positive for cytokeratin 7. This was felt to be consistent with a breast primary, but gynecologic and other malignancies were also in the differential.  In general 04/08/2015 the patient had a diagnostic right mammogram, which was negative. On the same day she had bilateral breast MRI. There were no masses or abnormal enhancement in either breast. In  the left axilla there was a round enhancing mass measuring 4.6 cm. There were mildly prominent smaller immediately adjacent lymph nodes. He was no right axillary or internal mammary adenopathy. An incidental lipoma was noted in the left lateral chest wall.  Her subsequent history is as detailed below.  INTERVAL HISTORY:  Robin Franklin returns today to the breast clinic for follow-up of her triple negative breast cancer, accompanied by her friend. Today she is due for cycle 3 of carboplatin and paclitaxel, given weekly x 12.  REVIEW OF SYSTEMS: Robin Franklin tolerated this cycle past cycle much better than the first. She denies fevers, chills, nausea, or vomiting. The hip, back, and abdominal pain from last week has resolved. She is still constipated, and now her stools are in little balls. She has a scratchy throat and has been drinking warm teat to soothe it. She denies mouth sores or rashes. She has a softness to her fingertips and toes, but no numbness. The baseline numbness to her right thumb is no worse. A detailed review of systems is otherwise stable.   PAST MEDICAL HISTORY: Past Medical History  Diagnosis Date  . Hypothyroidism   . Headache(784.0)   . Anxiety   . Breast cancer   . Arthritis   . Joint pain     PAST SURGICAL HISTORY: Past Surgical History  Procedure Laterality Date  . Breast reduction surgery    . Cervical cone biopsy    . Cyst removed from neck    . Colonoscopy  11/06/2011    Procedure: COLONOSCOPY;  Surgeon: Robin Peng, MD;  [facility];  Service: Endoscopy;  Laterality: N/A;  11:30 AM  . Steriod injection  FAMILY HISTORY Family History  Problem Relation Age of Onset  . Colon cancer Neg Hx   . Breast cancer Mother   the patient's father died from a myocardial infarction at the age of 57. [name] died from breast cancer at the age of 34. It was diagnosed at age 51. The patient has 2 brothers, one sister. There is no other history of breast or  ovarian cancer in the family to her knowledge.  GYNECOLOGIC HISTORY:  Patient's last menstrual period was 07/20/2013 (approximate). Menarche age 71, first live birth age 28. The patient is GX P2. She used oral contraceptives for a few months remotely with no side effects. She stopped having periods in September 2014. 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 At home it's just the 2 of them, with no pets. Robin Franklin's children from an earlier marriage are Robin Franklin. Robin Franklin, who lives in [place] and is a Public relations account executive; and Robin Franklin, who lives in [place] and is a Biochemist, clinical. The patient has 4 grandchildren +2 by adoption. She attends a [place] locally    ADVANCED DIRECTIVES: Not in place   HEALTH MAINTENANCE: History  Substance Use Topics  . Smoking status: Never Smoker   . Smokeless tobacco: Not on file  . Alcohol Use: No     Colonoscopy: 2013/ Robin Franklin  PAP: February 2015  Bone density: Never  Lipid panel:  No Known Allergies  Current Outpatient Prescriptions  Medication Sig Dispense Refill  . ALPRAZolam (XANAX) 0.5 MG tablet Take 0.5 mg by mouth daily as needed for anxiety. For anxiety    . Cholecalciferol (VITAMIN D3) 50000 UNITS CAPS Take 1 capsule by mouth once a week.    . Cyanocobalamin (VITAMIN B 12 PO) Take 1 tablet by mouth daily.    Marland Kitchen ibuprofen (ADVIL,MOTRIN) 200 MG tablet Take 400 mg by mouth every 6 (six) hours as needed for moderate pain.    Marland Kitchen levothyroxine (SYNTHROID, LEVOTHROID) 75 MCG tablet Take 75 mcg by mouth daily before breakfast.    . lidocaine-prilocaine (EMLA) cream Apply 1 application topically as needed. 30 g 3  . Melatonin 3 MG TABS Take 3 mg by mouth at bedtime as needed (Sleep).    . Multiple Vitamin (MULTIVITAMIN WITH MINERALS) TABS tablet Take 1 tablet by mouth daily.    . naproxen sodium (ANAPROX) 220  MG tablet Take 220 mg by mouth 2 (two) times daily with a meal.    . Omega-3 Fatty Acids (FISH OIL) 1000 MG CAPS Take 1 capsule by mouth daily.    Marland Kitchen UNABLE TO FIND Apply topically 4 (four) times daily. Keto%/Bac2%/Gab5%/Lido5%    . cyclobenzaprine (FLEXERIL) 10 MG tablet   0  . gabapentin (NEURONTIN) 100 MG capsule Take 100 mg by mouth.    Marland Kitchen HYDROcodone-acetaminophen (NORCO/VICODIN) 5-325 MG per tablet     . LORazepam (ATIVAN) 0.5 MG tablet Take 1 tablet (0.5 mg total) by mouth at bedtime as needed (Nausea or vomiting). (Patient not taking: Reported on 03/21/2015) 30 tablet 0  . prochlorperazine (COMPAZINE) 10 MG tablet TAKE 1 TABLET (10 MG TOTAL) BY MOUTH EVERY 6 (SIX) HOURS AS NEEDED (NAUSEA OR VOMITING). (Patient not taking: Reported on 03/28/2015) 30 tablet 1  . tobramycin-dexamethasone (TOBRADEX) ophthalmic solution Place 1 drop into both eyes every 4 (four) hours while awake. (Patient not taking: Reported on 03/14/2015) 5 mL  0   No current facility-administered medications for this visit.   Facility-Administered Medications Ordered in Other Visits  Medication Dose Route Frequency Provider Last Rate Last Dose  . CARBOplatin (PARAPLATIN) 250 mg in sodium chloride 0.9 % 100 mL chemo infusion  250 mg Intravenous Once Robin Cruel, MD      . heparin lock flush 100 unit/mL  500 Units Intracatheter Once PRN Robin Cruel, MD      . PACLitaxel (TAXOL) 162 mg in dextrose 5 % 250 mL chemo infusion (</= 21m/m2)  80 mg/m2 (Treatment Plan Actual) Intravenous Once GChauncey Cruel MD 277 mL/hr at 03/28/15 1505 162 mg at 03/28/15 1505  . sodium chloride 0.9 % injection 10 mL  10 mL Intracatheter PRN GChauncey Cruel MD        OBJECTIVE: Middle-aged African-American woman in no acute distress Filed Vitals:   03/28/15 1314  BP: 113/72  Pulse: 82  Temp: 98.4 F (36.9 C)  Resp: 18     Body mass index is 30.41 kg/(m^2).    ECOG FS:1 - Symptomatic but completely ambulatory   Sclerae  unicteric, pupils equal and reactive Oropharynx clear and moist-- no thrush No cervical or supraclavicular adenopathy Lungs no rales or rhonchi Heart regular rate and rhythm Abd soft, nontender, positive bowel sounds MSK no focal spinal tenderness, no upper extremity lymphedema Neuro: nonfocal, well oriented, appropriate affect Breasts: deferred  LAB RESULTS:  CMP     Component Value Date/Time   NA 140 03/28/2015 1259   NA 142 01/13/2014 1928   K 4.0 03/28/2015 1259   K 3.5* 01/13/2014 1928   CL 103 01/13/2014 1928   CO2 23 03/28/2015 1259   CO2 25 12/23/2010 1229   GLUCOSE 142* 03/28/2015 1259   GLUCOSE 82 01/13/2014 1928   BUN 11.7 03/28/2015 1259   BUN 14 01/13/2014 1928   CREATININE 0.9 03/28/2015 1259   CREATININE 1.00 01/13/2014 1928   CALCIUM 8.9 03/28/2015 1259   CALCIUM 9.1 12/23/2010 1229   PROT 6.6 03/28/2015 1259   ALBUMIN 4.0 03/28/2015 1259   AST 20 03/28/2015 1259   ALT 31 03/28/2015 1259   ALKPHOS 56 03/28/2015 1259   BILITOT 0.24 03/28/2015 1259   GFRNONAA >60 12/23/2010 1229   GFRAA  12/23/2010 1229    >60        The eGFR has been calculated using the MDRD equation. This calculation has not been validated in all clinical situations. eGFR's persistently <60 mL/min signify possible Chronic Kidney Disease.    INo results found for: SPEP, UPEP  Lab Results  Component Value Date   WBC 5.2 03/28/2015   NEUTROABS 3.7 03/28/2015   HGB 10.3* 03/28/2015   HCT 30.3* 03/28/2015   MCV 86.6 03/28/2015   PLT 338 03/28/2015      Chemistry      Component Value Date/Time   NA 140 03/28/2015 1259   NA 142 01/13/2014 1928   K 4.0 03/28/2015 1259   K 3.5* 01/13/2014 1928   CL 103 01/13/2014 1928   CO2 23 03/28/2015 1259   CO2 25 12/23/2010 1229   BUN 11.7 03/28/2015 1259   BUN 14 01/13/2014 1928   CREATININE 0.9 03/28/2015 1259   CREATININE 1.00 01/13/2014 1928      Component Value Date/Time   CALCIUM 8.9 03/28/2015 1259   CALCIUM 9.1  12/23/2010 1229   ALKPHOS 56 03/28/2015 1259   AST 20 03/28/2015 1259   ALT 31 03/28/2015 1259   BILITOT 0.24  03/28/2015 1259       No results found for: LABCA2  No components found for: XOVAN191  No results for input(s): INR in the last 168 hours.  Urinalysis    Component Value Date/Time   COLORURINE YELLOW 01/13/2014 1924   APPEARANCEUR CLEAR 01/13/2014 1924   LABSPEC >1.030* 01/13/2014 1924   PHURINE 5.5 01/13/2014 1924   GLUCOSEU NEGATIVE 01/13/2014 1924   HGBUR NEGATIVE 01/13/2014 1924   BILIRUBINUR NEGATIVE 01/13/2014 1924   KETONESUR NEGATIVE 01/13/2014 1924   PROTEINUR NEGATIVE 01/13/2014 1924   UROBILINOGEN 0.2 01/13/2014 1924   NITRITE NEGATIVE 01/13/2014 1924   LEUKOCYTESUR NEGATIVE 01/13/2014 1924    STUDIES: No results found.  ASSESSMENT: 54 y.o. [place] woman status post biopsy 11/15/2014 of a 4.6 cm left axillary mass showing a poorly differentiated carcinoma, triple negative, with an MIB-1 of 90%.  (1) s/p left axillary lymph node dissection under Dr.Howard-McNatt 12/23/2014 with 1 of 14 lymph nodes positive; TX N1, stage II; repeat prognostic panel pending  (2) adjuvant chemotherapy to start 01/17/2015, consisting of cyclophosphamide and doxorubicin 4 given in dose dense fashion, followed by paclitaxel and carboplatin given weekly 12  (a) neulasta to be given on day 2, via onbody injector  (3) radiation will follow chemotherapy  (4) genetics counseling has been scheduled  PLAN: Lorra is managing treatment well. The labs were reviewed in detail and were entirely stable. She will proceed with cycle 3 of paclitaxel and carboplatin as planned today.   She will treat her sinus complaints symptomatically with warm team and salt water rinses. I advised that she begin on a stool softener and miralax daily for her constipation.   Wenona will return in 1 week for labs and a follow up visit. She understands and agrees with this plan. She knows the goal of  treatment in her case is cure. She has been encouraged to call with any issues that might arise before her next visit here.   Laurie Panda, NP   03/28/2015 4:01 PM

## 2015-04-01 ENCOUNTER — Encounter: Payer: Self-pay | Admitting: Oncology

## 2015-04-01 NOTE — Progress Notes (Signed)
I faxed disability forms to  [phone]

## 2015-04-04 ENCOUNTER — Other Ambulatory Visit: Payer: 59

## 2015-04-04 ENCOUNTER — Telehealth: Payer: Self-pay | Admitting: Nurse Practitioner

## 2015-04-04 ENCOUNTER — Encounter: Payer: Self-pay | Admitting: Nurse Practitioner

## 2015-04-04 ENCOUNTER — Ambulatory Visit: Payer: 59

## 2015-04-04 ENCOUNTER — Other Ambulatory Visit (HOSPITAL_BASED_OUTPATIENT_CLINIC_OR_DEPARTMENT_OTHER): Payer: 59

## 2015-04-04 ENCOUNTER — Ambulatory Visit (HOSPITAL_BASED_OUTPATIENT_CLINIC_OR_DEPARTMENT_OTHER): Payer: 59 | Admitting: Nurse Practitioner

## 2015-04-04 ENCOUNTER — Ambulatory Visit (HOSPITAL_BASED_OUTPATIENT_CLINIC_OR_DEPARTMENT_OTHER): Payer: 59

## 2015-04-04 VITALS — BP 116/81 | HR 72 | Temp 98.0°F | Resp 18 | Ht 67.0 in | Wt 195.6 lb

## 2015-04-04 DIAGNOSIS — C50912 Malignant neoplasm of unspecified site of left female breast: Secondary | ICD-10-CM

## 2015-04-04 DIAGNOSIS — C50612 Malignant neoplasm of axillary tail of left female breast: Secondary | ICD-10-CM

## 2015-04-04 DIAGNOSIS — Z452 Encounter for adjustment and management of vascular access device: Secondary | ICD-10-CM | POA: Diagnosis not present

## 2015-04-04 DIAGNOSIS — C773 Secondary and unspecified malignant neoplasm of axilla and upper limb lymph nodes: Secondary | ICD-10-CM

## 2015-04-04 DIAGNOSIS — T451X5A Adverse effect of antineoplastic and immunosuppressive drugs, initial encounter: Secondary | ICD-10-CM | POA: Insufficient documentation

## 2015-04-04 DIAGNOSIS — G629 Polyneuropathy, unspecified: Secondary | ICD-10-CM | POA: Diagnosis not present

## 2015-04-04 DIAGNOSIS — Z95828 Presence of other vascular implants and grafts: Secondary | ICD-10-CM

## 2015-04-04 DIAGNOSIS — G620 Drug-induced polyneuropathy: Secondary | ICD-10-CM | POA: Insufficient documentation

## 2015-04-04 LAB — CBC WITH DIFFERENTIAL/PLATELET
BASO%: 1.3 % (ref 0.0–2.0)
Basophils Absolute: 0 10*3/uL (ref 0.0–0.1)
EOS%: 2.2 % (ref 0.0–7.0)
Eosinophils Absolute: 0.1 10*3/uL (ref 0.0–0.5)
HEMATOCRIT: 30.3 % — AB (ref 34.8–46.6)
HGB: 10.2 g/dL — ABNORMAL LOW (ref 11.6–15.9)
LYMPH%: 24.5 % (ref 14.0–49.7)
MCH: 29.7 pg (ref 25.1–34.0)
MCHC: 33.6 g/dL (ref 31.5–36.0)
MCV: 88.2 fL (ref 79.5–101.0)
MONO#: 0.3 10*3/uL (ref 0.1–0.9)
MONO%: 8.9 % (ref 0.0–14.0)
NEUT#: 1.8 10*3/uL (ref 1.5–6.5)
NEUT%: 63.1 % (ref 38.4–76.8)
Platelets: 268 10*3/uL (ref 145–400)
RBC: 3.43 10*6/uL — AB (ref 3.70–5.45)
RDW: 19.7 % — ABNORMAL HIGH (ref 11.2–14.5)
WBC: 2.8 10*3/uL — AB (ref 3.9–10.3)
lymph#: 0.7 10*3/uL — ABNORMAL LOW (ref 0.9–3.3)

## 2015-04-04 LAB — COMPREHENSIVE METABOLIC PANEL (CC13)
ALBUMIN: 3.9 g/dL (ref 3.5–5.0)
ALT: 27 U/L (ref 0–55)
ANION GAP: 10 meq/L (ref 3–11)
AST: 21 U/L (ref 5–34)
Alkaline Phosphatase: 56 U/L (ref 40–150)
BUN: 11.6 mg/dL (ref 7.0–26.0)
CO2: 25 meq/L (ref 22–29)
Calcium: 8.8 mg/dL (ref 8.4–10.4)
Chloride: 107 mEq/L (ref 98–109)
Creatinine: 0.8 mg/dL (ref 0.6–1.1)
GLUCOSE: 129 mg/dL (ref 70–140)
POTASSIUM: 3.9 meq/L (ref 3.5–5.1)
SODIUM: 142 meq/L (ref 136–145)
TOTAL PROTEIN: 6.4 g/dL (ref 6.4–8.3)
Total Bilirubin: 0.3 mg/dL (ref 0.20–1.20)

## 2015-04-04 MED ORDER — SODIUM CHLORIDE 0.9 % IJ SOLN
10.0000 mL | INTRAMUSCULAR | Status: DC | PRN
  Administered 2015-04-04: 10 mL via INTRAVENOUS
  Filled 2015-04-04: qty 10

## 2015-04-04 NOTE — Patient Instructions (Signed)

## 2015-04-04 NOTE — Progress Notes (Signed)
[place] [facility]  Telephone:(336) [phone] Fax:(336) [phone]    ID: Robin Franklin DOB: 09/18/1961  MR#: [id number]  QIO#:[id number]  Patient Care Team: Redmond School, MD as PCP - General (Internal Medicine) Stark Klein, MD as Consulting Physician (General Surgery) Chauncey Cruel, MD as Consulting Physician (Oncology) Arloa Koh, MD as Consulting Physician (Radiation Oncology) Vania Rea, MD as Consulting Physician (Obstetrics and Gynecology) Newman Pies, MD as Consulting Physician (Neurosurgery) Angelina Ok, MD as Referring Physician (Surgery) OTHER MD:  CHIEF COMPLAINT: Triple negative breast cancer  CURRENT TREATMENT: Adjuvant chemotherapy  BREAST CANCER HISTORY: From the original intake note:  Robin Franklin herself noted a mass in her left axilla sometime in late November or she brought it to Dr. Nolon Rod attention and on 11/15/2014 she had a left mammogram and left ultrasound at the breast Center. The breast density was category 8. The left breast was entirely unremarkable, with no masses, distortion, or calcifications. In the left axilla there was a mass which was palpable and moderately firm. By ultrasound it measured 3.2 cm. There were no other axillary masses of concern.  Biopsy of the left axillary mass the same day showed ([id number]) a high-grade carcinoma which was estrogen and progesterone receptor negative, HER-2 negative, with a signals ratio of 1.53 and the number per cell of 2.60, with an MIB-1 of 95%. It was also gross cystic disease fluid protein negative, and negative for cytokeratin 20 and TTF-1. It was positive for cytokeratin 7. This was felt to be consistent with a breast primary, but gynecologic and other malignancies were also in the differential.  In general 04/08/2015 the patient had a diagnostic right mammogram, which was negative. On the same day she had bilateral breast MRI. There were no masses or abnormal enhancement in either breast. In  the left axilla there was a round enhancing mass measuring 4.6 cm. There were mildly prominent smaller immediately adjacent lymph nodes. He was no right axillary or internal mammary adenopathy. An incidental lipoma was noted in the left lateral chest wall.  Her subsequent history is as detailed below.  INTERVAL HISTORY:  Robin Franklin returns today to the breast clinic for follow-up of her triple negative breast cancer, accompanied by her brother. Today she is due for cycle 4 of carboplatin and paclitaxel, given weekly x 12.  REVIEW OF SYSTEMS: This week, Robin Franklin has felt intermittent numbness to the bottom of both feel and over half the length of her fingers on both sides. Otherwise she is managing treatment well. Her stools are soften since starting the colace, but still in ball formation. She has not tried miralax yet. Her appetite is good. She denies fevers, chills, nausea, or vomiting. She has no mouth sores or rashes. A detailed review of systems is otherwise stable.  PAST MEDICAL HISTORY: Past Medical History  Diagnosis Date  . Hypothyroidism   . Headache(784.0)   . Anxiety   . Breast cancer   . Arthritis   . Joint pain     PAST SURGICAL HISTORY: Past Surgical History  Procedure Laterality Date  . Breast reduction surgery    . Cervical cone biopsy    . Cyst removed from neck    . Colonoscopy  11/06/2011    Procedure: COLONOSCOPY;  Surgeon: Dorothyann Peng, MD;  [facility];  Service: Endoscopy;  Laterality: N/A;  11:30 AM  . Steriod injection      FAMILY HISTORY Family History  Problem Relation Age of Onset  . Colon cancer Neg  Hx   . Breast cancer Mother   the patient's father died from a myocardial infarction at the age of 1. [name] died from breast cancer at the age of 49. It was diagnosed at age 15. The patient has 2 brothers, one sister. There is no other history of breast or ovarian cancer in the family to her knowledge.  GYNECOLOGIC HISTORY:  Patient's  last menstrual period was 07/20/2013 (approximate). Menarche age 53, first live birth age 39. The patient is GX P2. She used oral contraceptives for a few months remotely with no side effects. She stopped having periods in September 2014. 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 [name] is a Merchant navy officer with [employer]. At home it's just the 2 of them, with no pets. [name] from an earlier marriage are [name]. [name], who lives in [place] and is a Public relations account executive; and [name], who lives in [place] and is a Biochemist, clinical. The patient has 4 grandchildren +2 by adoption. She attends a [employer] locally    ADVANCED DIRECTIVES: Not in place   HEALTH MAINTENANCE: History  Substance Use Topics  . Smoking status: Never Smoker   . Smokeless tobacco: Not on file  . Alcohol Use: No     Colonoscopy: 2013/ Re[facility]  PAP: February 2015  Bone density: Never  Lipid panel:  No Known Allergies  Current Outpatient Prescriptions  Medication Sig Dispense Refill  . Cyanocobalamin (VITAMIN B 12 PO) Take 1 tablet by mouth daily.    . cyclobenzaprine (FLEXERIL) 10 MG tablet   0  . ibuprofen (ADVIL,MOTRIN) 200 MG tablet Take 400 mg by mouth every 6 (six) hours as needed for moderate pain.    Marland Kitchen levothyroxine (SYNTHROID, LEVOTHROID) 75 MCG tablet Take 75 mcg by mouth daily before breakfast.    . lidocaine-prilocaine (EMLA) cream Apply 1 application topically as needed. 30 g 3  . Melatonin 3 MG TABS Take 3 mg by mouth at bedtime as needed (Sleep).    . Multiple Vitamin (MULTIVITAMIN WITH MINERALS) TABS tablet Take 1 tablet by mouth daily.    . naproxen sodium (ANAPROX) 220 MG tablet Take 220 mg by mouth 2 (two) times daily with a meal.    . Omega-3 Fatty Acids (FISH OIL) 1000 MG CAPS Take 1 capsule by mouth daily.    Marland Kitchen UNABLE TO FIND Apply topically 4 (four) times daily.  Keto%/Bac2%/Gab5%/Lido5%    . VITAMIN D, ERGOCALCIFEROL, PO Take 5,000 Units by mouth once a week.    . ALPRAZolam (XANAX) 0.5 MG tablet Take 0.5 mg by mouth daily as needed for anxiety. For anxiety    . gabapentin (NEURONTIN) 100 MG capsule Take 100 mg by mouth.    Marland Kitchen HYDROcodone-acetaminophen (NORCO/VICODIN) 5-325 MG per tablet     . LORazepam (ATIVAN) 0.5 MG tablet Take 1 tablet (0.5 mg total) by mouth at bedtime as needed (Nausea or vomiting). (Patient not taking: Reported on 03/21/2015) 30 tablet 0  . prochlorperazine (COMPAZINE) 10 MG tablet TAKE 1 TABLET (10 MG TOTAL) BY MOUTH EVERY 6 (SIX) HOURS AS NEEDED (NAUSEA OR VOMITING). (Patient not taking: Reported on 03/28/2015) 30 tablet 1  . tobramycin-dexamethasone (TOBRADEX) ophthalmic solution Place 1 drop into both eyes every 4 (four) hours while awake. (Patient not taking: Reported on 03/14/2015) 5 mL 0   No current facility-administered medications for this visit.   Facility-Administered Medications Ordered in Other  Visits  Medication Dose Route Frequency Provider Last Rate Last Dose  . sodium chloride 0.9 % injection 10 mL  10 mL Intravenous PRN Laurie Panda, NP   10 mL at 04/04/15 1334    OBJECTIVE: Middle-aged African-American woman in no acute distress Filed Vitals:   04/04/15 1334  BP: 116/81  Pulse: 72  Temp: 98 F (36.7 C)  Resp: 18     Body mass index is 30.63 kg/(m^2).    ECOG FS:1 - Symptomatic but completely ambulatory   Skin: warm, dry  HEENT: sclerae anicteric, conjunctivae pink, oropharynx clear. No thrush or mucositis.  Lymph Nodes: No cervical or supraclavicular lymphadenopathy  Lungs: clear to auscultation bilaterally, no rales, wheezes, or rhonci  Heart: regular rate and rhythm  Abdomen: round, soft, non tender, positive bowel sounds  Musculoskeletal: No focal spinal tenderness, no peripheral edema  Neuro: non focal, well oriented, positive affect  Breasts: deferred  LAB RESULTS:  CMP     Component  Value Date/Time   NA 142 04/04/2015 1249   NA 142 01/13/2014 1928   K 3.9 04/04/2015 1249   K 3.5* 01/13/2014 1928   CL 103 01/13/2014 1928   CO2 25 04/04/2015 1249   CO2 25 12/23/2010 1229   GLUCOSE 129 04/04/2015 1249   GLUCOSE 82 01/13/2014 1928   BUN 11.6 04/04/2015 1249   BUN 14 01/13/2014 1928   CREATININE 0.8 04/04/2015 1249   CREATININE 1.00 01/13/2014 1928   CALCIUM 8.8 04/04/2015 1249   CALCIUM 9.1 12/23/2010 1229   PROT 6.4 04/04/2015 1249   ALBUMIN 3.9 04/04/2015 1249   AST 21 04/04/2015 1249   ALT 27 04/04/2015 1249   ALKPHOS 56 04/04/2015 1249   BILITOT 0.30 04/04/2015 1249   GFRNONAA >60 12/23/2010 1229   GFRAA  12/23/2010 1229    >60        The eGFR has been calculated using the MDRD equation. This calculation has not been validated in all clinical situations. eGFR's persistently <60 mL/min signify possible Chronic Kidney Disease.    INo results found for: SPEP, UPEP  Lab Results  Component Value Date   WBC 2.8* 04/04/2015   NEUTROABS 1.8 04/04/2015   HGB 10.2* 04/04/2015   HCT 30.3* 04/04/2015   MCV 88.2 04/04/2015   PLT 268 04/04/2015      Chemistry      Component Value Date/Time   NA 142 04/04/2015 1249   NA 142 01/13/2014 1928   K 3.9 04/04/2015 1249   K 3.5* 01/13/2014 1928   CL 103 01/13/2014 1928   CO2 25 04/04/2015 1249   CO2 25 12/23/2010 1229   BUN 11.6 04/04/2015 1249   BUN 14 01/13/2014 1928   CREATININE 0.8 04/04/2015 1249   CREATININE 1.00 01/13/2014 1928      Component Value Date/Time   CALCIUM 8.8 04/04/2015 1249   CALCIUM 9.1 12/23/2010 1229   ALKPHOS 56 04/04/2015 1249   AST 21 04/04/2015 1249   ALT 27 04/04/2015 1249   BILITOT 0.30 04/04/2015 1249       No results found for: LABCA2  No components found for: LABCA125  No results for input(s): INR in the last 168 hours.  Urinalysis    Component Value Date/Time   COLORURINE YELLOW 01/13/2014 1924   APPEARANCEUR CLEAR 01/13/2014 1924   LABSPEC >1.030*  01/13/2014 1924   PHURINE 5.5 01/13/2014 1924   GLUCOSEU NEGATIVE 01/13/2014 1924   HGBUR NEGATIVE 01/13/2014 [place] NEGATIVE 01/13/2014 1924  KETONESUR NEGATIVE 01/13/2014 1924   PROTEINUR NEGATIVE 01/13/2014 1924   UROBILINOGEN 0.2 01/13/2014 1924   NITRITE NEGATIVE 01/13/2014 1924   LEUKOCYTESUR NEGATIVE 01/13/2014 1924    STUDIES: No results found.  ASSESSMENT: 54 y.o. [place] woman status post biopsy 11/15/2014 of a 4.6 cm left axillary mass showing a poorly differentiated carcinoma, triple negative, with an MIB-1 of 90%.  (1) s/p left axillary lymph node dissection under Dr.Howard-McNatt 12/23/2014 with 1 of 14 lymph nodes positive; TX N1, stage II; repeat prognostic panel pending  (2) adjuvant chemotherapy to start 01/17/2015, consisting of cyclophosphamide and doxorubicin 4 given in dose dense fashion, followed by paclitaxel and carboplatin given weekly 12  (a) neulasta to be given on day 2, via onbody injector  (3) radiation will follow chemotherapy  (4) genetics counseling has been scheduled  PLAN: I consulted with Dr. Jana Hakim regarding the transient neuropathy symptoms. Because it is starting to affect a greater area of her hands and both feet, we are going to hold treatment for the next 2 weeks. When she returns, we may be able to resume treatment if the symptoms have resolved or improved some. In the meantime, I have placed a referral for a radiation consult in June. It is likely that even if we can get in another dose or 2, it is unlikely that she will be able to reach week 12.   Robin Franklin will return in 2 weeks for follow up. She understand and agrees with this plan. She knows the goal of treatment in her case is cure. She has been encouraged to call with any issues that might arise before her next visit here.    Laurie Panda, NP   04/04/2015 4:23 PM

## 2015-04-04 NOTE — Telephone Encounter (Signed)
Per pof 5/23 appointments cancelled,referral noted for rad onc and they will call the patient

## 2015-04-05 ENCOUNTER — Telehealth: Payer: Self-pay | Admitting: *Deleted

## 2015-04-05 NOTE — Telephone Encounter (Signed)
This RN spoke with pt per her call to on call MD pm 5/16 with concerns of " seeing veins on her neck, chest more then before and green color "  Per discussion are " lighter and not as noticeable ".  Areas of above were not localized in one area more then another - and was on neck, chest and both arms.  Pt was in the bathroom late pm " washing my face " when she noticed.  She states she has the numbness and tingling " and it is more in my right hand and right foot "  Arms and legs are equal in size and again with no area of focal discomfort " just a funny all over sensation in my feet and hands"  This RN discussed with pt discoloration may be related to chemo and circulation- usually transient.  Per numbness and tingling discussed related to chemo which is being held at present for best recovery.  Discussed other interventions per pt's statement " what else can I do?" including vitamin B12 and glucosamine chondroitin as well use of diabetic neuropathy cream ( at [facility] ).  Plan per discussion is Robin Franklin will monitor skin changes and understands to call if worsens or area of discomfort is more focal.  She will implement home remedies and call this RN later this week with update.  No further needs at this time.

## 2015-04-08 ENCOUNTER — Encounter: Payer: Self-pay | Admitting: Radiation Oncology

## 2015-04-08 NOTE — Progress Notes (Signed)
Location of Breast Cancer: Left Breast  Histology per Pathology Report:   ACCESSION NUMBER: [id number] RECEIVED: 12/03/2014 ORDERING PHYSICIAN: MARISSA HOWARD-MCNATT , MD PATIENT NAME: Robin Franklin, Robin Franklin SURGICAL PATHOLOGY REPORT  FINAL PATHOLOGIC DIAGNOSIS MICROSCOPIC EXAMINATION AND DIAGNOSIS  "LYMPH NODE, NEEDLE/CORE BIOPSY, MASS, LEFT AXILLARY" ([facility] [id number]; COLLECTION DATE 11/15/2014): Metastatic carcinoma (see comment).   ACCESSION NUMBER: [id number] RECEIVED: 12/24/2014 ORDERING PHYSICIAN: Angelina Ok , MD PATIENT NAME: Robin Franklin SURGICAL PATHOLOGY REPORT  FINAL PATHOLOGIC DIAGNOSIS MICROSCOPIC EXAMINATION AND DIAGNOSIS  LEFT AXILLARY CONTENTS, AXILLARY DISSECTION: One of fourteen lymph nodes positive for metastatic carcinoma with extracapsular invasion (1/14).  Electronically Signed Out By: Virgilio Frees, Jerilynn Mages D., Pathology 12/28/2014 18:42:19  11/15/14 Diagnosis Lymph node, needle/core biopsy, mass, left axillary - METASTATIC CARCINOMA IN 1 OF 1 LYMPH NODE (1/1). - SEE COMMENT. Microscopic Comment  Receptor Status: ER(0%), PR (0%), Her2-neu (Neg), Ki-67(95%)   Olivianna Gang noted a mass in her left axilla sometime in late November or she brought it to Dr. Nolon Rod attention and on 11/15/2014 she had a left mammogram and left ultrasound at the breast Center. The breast density was category 8. The left breast was entirely unremarkable, with no masses, distortion, or calcifications. In the left axilla there was a mass which was palpable and moderately firm. By ultrasound it measured 3.2 cm. There were no other axillary masses of concern    is a pleasant 54 y.o. female who presented to the [facility] for further evaluation of a newly diagnosed breast cancer. She initially felt a mass in her left axilla several months prior to presentation . Her mammogram was normal except for a left axillary mass. The breast MRI showed a negative breast  with a 4.6 cm axillary mass. She had a biopsy of the mass on 11/15/2014 that favored a breast primary, reportedly ER/PR Her2 negative. She denied any palpable breast masses, nipple discharge, or pain. She had a breast reduction in the past. After discussion of surgical options including axillary node dissection with and without mastectomy, risks and benefits of each of these procedures as well as the risk of recurrence, she elected for breast preservation. She underwent a left axillary lymph node dissection and right port-a-cath placement on 12/23/2014. Pathology revealed 1 out of 14 lymph nodes positive for malignancy with extracapsular extension. She was seen for her post-op visit on 12/30/2014. At that time her JP drain was not able to be removed due to amounts >30 cc in a 24 hour period.    Past/Anticipated interventions by surgeon, if any: Left Breast Needle core Biospy and Axillary Dissection  [facility], Mable Fill Howr=ard-McNatt,MD  Past/Anticipated interventions by medical oncology, if any:  Dr. Gunnar Bulla Magrinat: Chemotherapy: adjuvant chemotherapy to start 01/17/2015, consisting of cyclophosphamide and doxorubicin 4 given in dose dense fashion, followed by paclitaxel and carboplatin given weekly 12  Lymphedema issues, if any: NO   Pain issues, if any:  No  SAFETY ISSUES:  Prior radiation? No  Pacemaker/ICD? No  Possible current pregnancy?No  Is the patient on methotrexate? No  Current Complaints / other details:  Numbness under left axilla ,   Patient's last menstrual period was 07/20/2013 (approximate). Menarche age 72, first live birth age 13. The patient is GX P2. She used oral contraceptives for a few months remotely with no side effects. She stopped having periods in September 2014. 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 [name] is a Merchant navy officer with [employer]. At  home it's just the 2 of them, with no pets. Lugene's children  from an earlier marriage are [name]. [name], who lives in [place] and is a Public relations account executive; and [name], who lives in [place] and is a Biochemist, clinical. The patient has 4 grandchildren +2 by adoption. She attends a [employer] locally    Dennis, Crista Curb, RN 04/08/2015,2:01 PM

## 2015-04-11 ENCOUNTER — Ambulatory Visit: Payer: 59 | Admitting: Nurse Practitioner

## 2015-04-11 ENCOUNTER — Ambulatory Visit: Payer: 59

## 2015-04-11 ENCOUNTER — Other Ambulatory Visit: Payer: 59

## 2015-04-12 ENCOUNTER — Encounter: Payer: Self-pay | Admitting: Radiation Oncology

## 2015-04-12 ENCOUNTER — Ambulatory Visit
Admission: RE | Admit: 2015-04-12 | Discharge: 2015-04-12 | Disposition: A | Discharge status: Home / Self Care | Payer: 59 | Source: Ambulatory Visit | Attending: Radiation Oncology | Admitting: Radiation Oncology

## 2015-04-12 VITALS — BP 139/77 | HR 80 | Temp 97.7°F | Resp 16 | Ht 67.0 in | Wt 197.8 lb

## 2015-04-12 DIAGNOSIS — C773 Secondary and unspecified malignant neoplasm of axilla and upper limb lymph nodes: Secondary | ICD-10-CM | POA: Diagnosis not present

## 2015-04-12 DIAGNOSIS — Z510 Encounter for antineoplastic radiation therapy: Secondary | ICD-10-CM | POA: Diagnosis not present

## 2015-04-12 DIAGNOSIS — C50912 Malignant neoplasm of unspecified site of left female breast: Secondary | ICD-10-CM

## 2015-04-12 NOTE — Progress Notes (Addendum)
CC: Dr. Gunnar Bulla Magrinat, Dr. Redmond School,  Dr. 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 Robin Franklin is a pleasant 54 year old African-American female who is seen today for review in preparation of radiation therapy to follow systemic therapy in the management of her T0 N1 metastatic carcinoma to the left axilla of suspected left breast origin.  I first saw the patient at the multidisciplinary breast clinic on 11/24/2014. The patient states that she noted the development of a left axillary mass. Mammography and ultrasonography on 11/15/2014 showed a 3.2 x 2.6 x 3.2 cm axillary mass. He was no evidence for a left breast primary. On 11/15/2014 she underwent an ultrasound-guided biopsy and this was diagnostic for metastatic adenocarcinoma of suspected breast origin. Tumor was triple negative. Ki-67 was 90%. Breast MR on 11/23/2014 showed a 4.6 cm left axillary mass with prominent smaller lymph nodes adjacent to the mass. There was no evidence for internal mammary adenopathy or left breast or right breast primary.  Her staging workup was without evidence for metastatic disease.  She visited [facility] to see Dr. Mable Fill Howard-McNatt where she had her axillary dissection on 12/23/2014.  One of 14 lymph nodes contain metastatic disease, triple negative.  The involved lymph node measured 4 x 4 by 2.8 cm.  In her pathology report, I do not see any mention of extracapsular extension.  She return to [facility] for her adjuvant chemotherapy under the direction of Dr. Dr. Jana Hakim.  She received cyclophosphamide and doxorubicin 4 in a dose dense fashion followed by paclitaxel and carboplatin given weekly.  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  She may receive one more cycle and this will be determined next week.  She is  otherwise without complaints today.  Physical examination: Alert and oriented 53 year old after American female appearing much younger than her stated age. Filed Vitals:   04/12/15 0832  BP: 139/77  Pulse: 80  Temp: 97.7 F (36.5 C)  Resp: 16   Head and neck examination: She wears a wig.  Nodes: Without palpable cervical, supraclavicular, or axillary lymphadenopathy.  Chest: Lungs clear.  Breasts: Surgical scars from bilateral breast reduction surgery.  No masses are appreciated.  Abdomen without hepatomegaly.  Extremities: Without edema.  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  I discussed the rationale for left breast and regional node irradiation with the patient and her son.  We discussed the potential acute and late toxicities of radiation therapy including left arm lymphedema..  I feel that she would probably benefit from deep inspiration breath-hold technology to avoid cardiac irradiation.  She would like to be treated here in [place].  Consent is signed today.  We will wait until she has completed her chemotherapy before we get her scheduled for simulation/treatment planning.  We should have an idea by next week.  Plan: As above.  30 minutes was spent face-to-face with the patient, primarily counseling patient and coordinating her care.

## 2015-04-12 NOTE — Progress Notes (Signed)
Please see the Nurse Progress Note in the MD Initial Consult Encounter for this patient. 

## 2015-04-12 NOTE — Addendum Note (Signed)
Encounter addended by: Doreen Beam, RN on: 04/12/2015  2:03 PM<BR>     Documentation filed: Charges VN

## 2015-04-19 ENCOUNTER — Ambulatory Visit: Payer: 59

## 2015-04-19 ENCOUNTER — Other Ambulatory Visit: Payer: 59

## 2015-04-19 ENCOUNTER — Other Ambulatory Visit: Payer: Self-pay | Admitting: *Deleted

## 2015-04-19 ENCOUNTER — Ambulatory Visit (HOSPITAL_BASED_OUTPATIENT_CLINIC_OR_DEPARTMENT_OTHER): Payer: 59

## 2015-04-19 ENCOUNTER — Ambulatory Visit (HOSPITAL_BASED_OUTPATIENT_CLINIC_OR_DEPARTMENT_OTHER): Payer: 59 | Admitting: Nurse Practitioner

## 2015-04-19 ENCOUNTER — Other Ambulatory Visit (HOSPITAL_BASED_OUTPATIENT_CLINIC_OR_DEPARTMENT_OTHER): Payer: 59

## 2015-04-19 ENCOUNTER — Encounter: Payer: Self-pay | Admitting: Nurse Practitioner

## 2015-04-19 ENCOUNTER — Telehealth: Payer: Self-pay | Admitting: Oncology

## 2015-04-19 VITALS — BP 126/77 | HR 86 | Temp 97.9°F | Resp 18 | Ht 67.0 in | Wt 195.1 lb

## 2015-04-19 DIAGNOSIS — G620 Drug-induced polyneuropathy: Secondary | ICD-10-CM | POA: Diagnosis not present

## 2015-04-19 DIAGNOSIS — C50612 Malignant neoplasm of axillary tail of left female breast: Secondary | ICD-10-CM

## 2015-04-19 DIAGNOSIS — Z452 Encounter for adjustment and management of vascular access device: Secondary | ICD-10-CM

## 2015-04-19 DIAGNOSIS — C50912 Malignant neoplasm of unspecified site of left female breast: Secondary | ICD-10-CM

## 2015-04-19 DIAGNOSIS — T451X5A Adverse effect of antineoplastic and immunosuppressive drugs, initial encounter: Secondary | ICD-10-CM

## 2015-04-19 DIAGNOSIS — Z171 Estrogen receptor negative status [ER-]: Secondary | ICD-10-CM | POA: Diagnosis not present

## 2015-04-19 DIAGNOSIS — Z95828 Presence of other vascular implants and grafts: Secondary | ICD-10-CM

## 2015-04-19 LAB — COMPREHENSIVE METABOLIC PANEL (CC13)
ALT: 29 U/L (ref 0–55)
ANION GAP: 9 meq/L (ref 3–11)
AST: 22 U/L (ref 5–34)
Albumin: 4 g/dL (ref 3.5–5.0)
Alkaline Phosphatase: 66 U/L (ref 40–150)
BILIRUBIN TOTAL: 0.36 mg/dL (ref 0.20–1.20)
BUN: 9 mg/dL (ref 7.0–26.0)
CO2: 25 meq/L (ref 22–29)
CREATININE: 0.8 mg/dL (ref 0.6–1.1)
Calcium: 9.2 mg/dL (ref 8.4–10.4)
Chloride: 110 mEq/L — ABNORMAL HIGH (ref 98–109)
EGFR: >90 mL/min/{1.73_m2} (ref >90)
GLUCOSE: 151 mg/dL — AB (ref 70–140)
Potassium: 3.7 mEq/L (ref 3.5–5.1)
SODIUM: 144 meq/L (ref 136–145)
Total Protein: 6.8 g/dL (ref 6.4–8.3)

## 2015-04-19 LAB — CBC WITH DIFFERENTIAL/PLATELET
BASO%: 0.5 % (ref 0.0–2.0)
BASOS ABS: 0 10*3/uL (ref 0.0–0.1)
EOS%: 2 % (ref 0.0–7.0)
Eosinophils Absolute: 0.1 10*3/uL (ref 0.0–0.5)
HEMATOCRIT: 34.6 % — AB (ref 34.8–46.6)
HEMOGLOBIN: 11.6 g/dL (ref 11.6–15.9)
LYMPH#: 0.9 10*3/uL (ref 0.9–3.3)
LYMPH%: 23.6 % (ref 14.0–49.7)
MCH: 29.5 pg (ref 25.1–34.0)
MCHC: 33.5 g/dL (ref 31.5–36.0)
MCV: 88 fL (ref 79.5–101.0)
MONO#: 0.4 10*3/uL (ref 0.1–0.9)
MONO%: 8.9 % (ref 0.0–14.0)
NEUT%: 65 % (ref 38.4–76.8)
NEUTROS ABS: 2.6 10*3/uL (ref 1.5–6.5)
Platelets: 147 10*3/uL (ref 145–400)
RBC: 3.93 10*6/uL (ref 3.70–5.45)
RDW: 16.4 % — ABNORMAL HIGH (ref 11.2–14.5)
WBC: 3.9 10*3/uL (ref 3.9–10.3)

## 2015-04-19 MED ORDER — SODIUM CHLORIDE 0.9 % IJ SOLN
10.0000 mL | INTRAMUSCULAR | Status: DC | PRN
  Administered 2015-04-19: 10 mL via INTRAVENOUS
  Filled 2015-04-19: qty 10

## 2015-04-19 NOTE — Progress Notes (Signed)
[facility]  [phone] Fax:([phone]) [phone]    ID: Robin Franklin DOB: 1961/07/05  MR#: [id number]  SHF#:[id number]  Patient Care Team: Redmond School, MD as PCP - General (Internal Medicine) Stark Klein, MD as Consulting Physician (General Surgery) Chauncey Cruel, MD as Consulting Physician (Oncology) Arloa Koh, MD as Consulting Physician (Radiation Oncology) Vania Rea, MD as Consulting Physician (Obstetrics and Gynecology) Newman Pies, MD as Consulting Physician (Neurosurgery) Angelina Ok, MD as Referring Physician (Surgery) OTHER MD:  CHIEF COMPLAINT: Triple negative breast cancer  CURRENT TREATMENT: Adjuvant chemotherapy  BREAST CANCER HISTORY: From the original intake note:  Robin Franklin herself noted a mass in her left axilla sometime in late November or she brought it to Dr. Nolon Rod attention and on 11/15/2014 she had a left mammogram and left ultrasound at the breast Center. The breast density was category 8. The left breast was entirely unremarkable, with no masses, distortion, or calcifications. In the left axilla there was a mass which was palpable and moderately firm. By ultrasound it measured 3.2 cm. There were no other axillary masses of concern.  Biopsy of the left axillary mass the same day showed ([id number]) a high-grade carcinoma which was estrogen and progesterone receptor negative, HER-2 negative, with a signals ratio of 1.53 and the number per cell of 2.60, with an MIB-1 of 95%. It was also gross cystic disease fluid protein negative, and negative for cytokeratin 20 and TTF-1. It was positive for cytokeratin 7. This was felt to be consistent with a breast primary, but gynecologic and other malignancies were also in the differential.  In general 04/08/2015 the patient had a diagnostic right mammogram, which was negative. On the same day she had bilateral breast MRI. There were no masses or abnormal enhancement in either breast. In  the left axilla there was a round enhancing mass measuring 4.6 cm. There were mildly prominent smaller immediately adjacent lymph nodes. He was no right axillary or internal mammary adenopathy. An incidental lipoma was noted in the left lateral chest wall.  Her subsequent history is as detailed below.  INTERVAL HISTORY:  Robin Franklin returns today to the breast clinic for follow-up of her triple negative breast cancer, accompanied by her sister. She presents for reevaluation of her neuropathy symptoms, to see if it would be possible to restart paclitaxel and carboplatin weekly. Robin Franklin's once persistent numbness and tingling to her hands and feet is now only intermittent, but still covers about the same surface area.  REVIEW OF SYSTEMS: Since she has not been treated in 2 weeks, Robin Franklin denies fevers, chills, nausea, vomiting, or changes in bowel or bladder habits. She is eating and drinking well. Her energy level is fair. She denies mouth sores or rashes. She continues to have the low back sciatic pain, but gabapentin has helped with this. A detailed review of systems is otherwise stable.  PAST MEDICAL HISTORY: Past Medical History  Diagnosis Date  . Hypothyroidism   . Headache(784.0)   . Anxiety   . Breast cancer   . Arthritis   . Joint pain     PAST SURGICAL HISTORY: Past Surgical History  Procedure Laterality Date  . Breast reduction surgery    . Cervical cone biopsy    . Cyst removed from neck    . Colonoscopy  11/06/2011    Procedure: COLONOSCOPY;  Surgeon: Dorothyann Peng, MD;  Location: [facility];  Service: Endoscopy;  Laterality: N/A;  11:30 AM  . Steriod injection  FAMILY HISTORY Family History  Problem Relation Age of Onset  . Colon cancer Neg Hx   . Breast cancer Mother 39  the patient's father died from a myocardial infarction at the age of 42. [name] died from breast cancer at the age of 24. It was diagnosed at age 91. The patient has 2 brothers, one sister.  There is no other history of breast or ovarian cancer in the family to her knowledge.  GYNECOLOGIC HISTORY:  Patient's last menstrual period was 07/20/2013 (approximate). Menarche age 1, first live birth age 82. The patient is GX P2. She used oral contraceptives for a few months remotely with no side effects. She stopped having periods in September 2014. 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 At home it's just the 2 of them, with no pets. [name] children from an earlier marriage are Robin Franklin. Robin Franklin, who lives in [place] and is a Public relations account executive; and Robin Franklin, who lives in [place] and is a Biochemist, clinical. The patient has 4 grandchildren +2 by adoption. She attends a [employer] locally    ADVANCED DIRECTIVES: Not in place   HEALTH MAINTENANCE: History  Substance Use Topics  . Smoking status: Never Smoker   . Smokeless tobacco: Not on file  . Alcohol Use: No     Colonoscopy: 2013/ [facility]  PAP: February 2015  Bone density: Never  Lipid panel:  Allergies  Allergen Reactions  . Other Rash    Unknown Flu Vaccine from 2015 caused rash    Current Outpatient Prescriptions  Medication Sig Dispense Refill  . ALPRAZolam (XANAX) 0.5 MG tablet Take 0.5 mg by mouth daily as needed for anxiety. For anxiety    . Cyanocobalamin (VITAMIN B 12 PO) Take 1 tablet by mouth daily.    . cyclobenzaprine (FLEXERIL) 10 MG tablet Take 10 mg by mouth as needed.   0  . levothyroxine (SYNTHROID, LEVOTHROID) 75 MCG tablet Take 75 mcg by mouth daily before breakfast.    . lidocaine-prilocaine (EMLA) cream Apply 1 application topically as needed. 30 g 3  . LORazepam (ATIVAN) 0.5 MG tablet Take 1 tablet (0.5 mg total) by mouth at bedtime as needed (Nausea or vomiting). 30 tablet 0  . Melatonin 3 MG TABS Take 3 mg by mouth at bedtime as needed (Sleep).    .  Multiple Vitamin (MULTIVITAMIN WITH MINERALS) TABS tablet Take 1 tablet by mouth daily.    . Omega-3 Fatty Acids (FISH OIL) 1000 MG CAPS Take 1 capsule by mouth daily.    Marland Kitchen tobramycin-dexamethasone (TOBRADEX) ophthalmic solution Place 1 drop into both eyes every 4 (four) hours while awake. 5 mL 0  . VITAMIN D, ERGOCALCIFEROL, PO Take 5,000 Units by mouth once a week.    . gabapentin (NEURONTIN) 100 MG capsule Take 100 mg by mouth.    Marland Kitchen HYDROcodone-acetaminophen (NORCO/VICODIN) 5-325 MG per tablet     . ibuprofen (ADVIL,MOTRIN) 200 MG tablet Take 400 mg by mouth every 6 (six) hours as needed for moderate pain.    . naproxen sodium (ANAPROX) 220 MG tablet Take 220 mg by mouth 2 (two) times daily with a meal.    . prochlorperazine (COMPAZINE) 10 MG tablet TAKE 1 TABLET (10 MG TOTAL) BY MOUTH EVERY 6 (SIX) HOURS AS NEEDED (NAUSEA OR VOMITING). (Patient not taking: Reported on 04/12/2015) 30 tablet 1  . UNABLE  TO FIND Apply 1 application topically 4 (four) times daily. Keto%/Bac2%/Gab5%/Lido5%     No current facility-administered medications for this visit.    OBJECTIVE: Middle-aged African-American woman in no acute distress Filed Vitals:   04/19/15 1032  BP: 126/77  Pulse: 86  Temp: 97.9 F (36.6 C)  Resp: 18     Body mass index is 30.55 kg/(m^2).    ECOG FS:1 - Symptomatic but completely ambulatory   Skin: warm, dry  HEENT: sclerae anicteric, conjunctivae pink, oropharynx clear. No thrush or mucositis.  Lymph Nodes: No cervical or supraclavicular lymphadenopathy  Lungs: clear to auscultation bilaterally, no rales, wheezes, or rhonci  Heart: regular rate and rhythm  Abdomen: round, soft, non tender, positive bowel sounds  Musculoskeletal: No focal spinal tenderness, no peripheral edema  Neuro: non focal, well oriented, positive affect  Breasts: deferred  LAB RESULTS:  CMP     Component Value Date/Time   NA 144 04/19/2015 0948   NA 142 01/13/2014 1928   K 3.7 04/19/2015 0948   K  3.5* 01/13/2014 1928   CL 103 01/13/2014 1928   CO2 25 04/19/2015 0948   CO2 25 12/23/2010 1229   GLUCOSE 151* 04/19/2015 0948   GLUCOSE 82 01/13/2014 1928   BUN 9.0 04/19/2015 0948   BUN 14 01/13/2014 1928   CREATININE 0.8 04/19/2015 0948   CREATININE 1.00 01/13/2014 1928   CALCIUM 9.2 04/19/2015 0948   CALCIUM 9.1 12/23/2010 1229   PROT 6.8 04/19/2015 0948   ALBUMIN 4.0 04/19/2015 0948   AST 22 04/19/2015 0948   ALT 29 04/19/2015 0948   ALKPHOS 66 04/19/2015 0948   BILITOT 0.36 04/19/2015 0948   GFRNONAA >60 12/23/2010 1229   GFRAA  12/23/2010 1229    >60        The eGFR has been calculated using the MDRD equation. This calculation has not been validated in all clinical situations. eGFR's persistently <60 mL/min signify possible Chronic Kidney Disease.    INo results found for: SPEP, UPEP  Lab Results  Component Value Date   WBC 3.9 04/19/2015   NEUTROABS 2.6 04/19/2015   HGB 11.6 04/19/2015   HCT 34.6* 04/19/2015   MCV 88.0 04/19/2015   PLT 147 04/19/2015      Chemistry      Component Value Date/Time   NA 144 04/19/2015 0948   NA 142 01/13/2014 1928   K 3.7 04/19/2015 0948   K 3.5* 01/13/2014 1928   CL 103 01/13/2014 1928   CO2 25 04/19/2015 0948   CO2 25 12/23/2010 1229   BUN 9.0 04/19/2015 0948   BUN 14 01/13/2014 1928   CREATININE 0.8 04/19/2015 0948   CREATININE 1.00 01/13/2014 1928      Component Value Date/Time   CALCIUM 9.2 04/19/2015 0948   CALCIUM 9.1 12/23/2010 1229   ALKPHOS 66 04/19/2015 0948   AST 22 04/19/2015 0948   ALT 29 04/19/2015 0948   BILITOT 0.36 04/19/2015 0948       No results found for: LABCA2  No components found for: LABCA125  No results for input(s): INR in the last 168 hours.  Urinalysis    Component Value Date/Time   COLORURINE YELLOW 01/13/2014 1924   APPEARANCEUR CLEAR 01/13/2014 1924   LABSPEC >1.030* 01/13/2014 1924   PHURINE 5.5 01/13/2014 1924   GLUCOSEU NEGATIVE 01/13/2014 1924   HGBUR NEGATIVE  01/13/2014 1924   BILIRUBINUR NEGATIVE 01/13/2014 1924   KETONESUR NEGATIVE 01/13/2014 1924   PROTEINUR NEGATIVE 01/13/2014 1924   UROBILINOGEN 0.2  01/13/2014 1924   NITRITE NEGATIVE 01/13/2014 1924   LEUKOCYTESUR NEGATIVE 01/13/2014 1924    STUDIES: No results found.  ASSESSMENT: 54 y.o. [place] woman status post biopsy 11/15/2014 of a 4.6 cm left axillary mass showing a poorly differentiated carcinoma, triple negative, with an MIB-1 of 90%.  (1) s/p left axillary lymph node dissection under Dr.Howard-McNatt 12/23/2014 with 1 of 14 lymph nodes positive; TX N1, stage II; repeat prognostic panel pending  (2) adjuvant chemotherapy to start 01/17/2015, consisting of cyclophosphamide and doxorubicin 4 given in dose dense fashion, with neulasta given on day 2. Followed by paclitaxel and carboplatin given weekly 12, stopped after only 3 cycles because of progressive neuropathy   (3) radiation will follow chemotherapy  (4) genetics counseling canceled by patient, to be rescheduled per patient request.  PLAN: I discussed Tarren's neuropathy symptoms with Dr. Jana Hakim, and at this point we believe it is prudent to discontinue the chemo regimen altogether. If we continued, the neuropathy would progress and potentially disable the patient. He explained this to her, and advised that she move forward with radiation. In the meantime, she may qualify for a research study. Dr. Jana Hakim will look into her eligibility. The patient was hesitant about this, but welcomes the information.   Cassadi will return for labs and a follow up visit at the completion of her radiation treatments in late July. She understands and agrees with this plan. She knows the goal of treatment in her case is cure. She has been encouraged to call with any issues that might arise before her next visit here.     Robin Panda, NP   04/19/2015 1:24 PM    ADDENDUM: With all Ms. Pretlow that if we continue the current treatment course  very likely she would have significant neuropathy which could be permanent. We definitely do not want that. Accordingly we are stopping chemotherapy now.  She will proceed to radiation. I did mention to her that once she finishes radiation she may qualify for our. Red trial in triple negative patients. I will alert the study nurses to make sure she would qualify before we discuss that further.  I personally saw this patient and performed a substantive portion of this encounter with the listed APP documented above.   Chauncey Cruel, MD Medical Oncology and Hematology [facility] [address] [place], [place] [address] Tel. [phone]    Fax. [phone]

## 2015-04-19 NOTE — Telephone Encounter (Signed)
Called and left a message with 7/25 appointment.

## 2015-04-19 NOTE — Patient Instructions (Signed)

## 2015-04-25 ENCOUNTER — Other Ambulatory Visit: Payer: Self-pay | Admitting: *Deleted

## 2015-05-06 ENCOUNTER — Other Ambulatory Visit: Payer: Self-pay | Admitting: *Deleted

## 2015-05-06 MED ORDER — GABAPENTIN 100 MG PO CAPS: 100.0000 mg | ORAL_CAPSULE | Freq: Two times a day (BID) | ORAL | Status: DC

## 2015-05-09 ENCOUNTER — Ambulatory Visit
Admission: RE | Admit: 2015-05-09 | Discharge: 2015-05-09 | Disposition: A | Discharge status: Home / Self Care | Payer: 59 | Source: Ambulatory Visit | Attending: Radiation Oncology | Admitting: Radiation Oncology

## 2015-05-09 DIAGNOSIS — C773 Secondary and unspecified malignant neoplasm of axilla and upper limb lymph nodes: Secondary | ICD-10-CM

## 2015-05-09 DIAGNOSIS — Z510 Encounter for antineoplastic radiation therapy: Secondary | ICD-10-CM | POA: Diagnosis not present

## 2015-05-09 NOTE — Progress Notes (Signed)
Complex simulation/treatment planning note: The patient was taken to the CT simulator.  A custom Vac lock immobilization device was constructed with her left arm extended.  The left breast was marked with radiopaque wires.  Her left axillary scar was also marked.  She was scanned free breathing.  The cardiac silhouette was within tangential fields, so she was rescanned with deep inspiration breath-hold which showed significant movement of the cardiac silhouette away from her tangential fields.  The CT data set was sent to the planning system where she was set up to medial and lateral left breast tangents with 2 sets of MLCs.  She was set up RAO to the left supraclavicular/axillary region as well.  I prescribing 5040 cGy to her left breast and 28 sessions and also the left supraclavicular/axillary region at a depth of 3 cm.  I'm looking for a minimal dose of 4500 cGy to her anterior to mid axilla.  She is now ready for 3-D simulation.

## 2015-05-11 ENCOUNTER — Encounter: Payer: Self-pay | Admitting: Radiation Oncology

## 2015-05-11 DIAGNOSIS — Z510 Encounter for antineoplastic radiation therapy: Secondary | ICD-10-CM | POA: Diagnosis not present

## 2015-05-11 NOTE — Progress Notes (Signed)
3-D simulation note: The patient completed 3-D simulation for treatment to her left breast and regional lymph nodes.  Dose volume histograms were obtained for the lungs and heart we met our departmental guidelines.  She was set up to medial and lateral left breast tangents with 2 sets of MLCs and 2 sets of electronic compensation requiring four complex treatment devices.  She was also set up to her left supraclavicular region RAO and PA axilla with 2 separate multileaf collimators for a total of 6 complex treatment devices.  I prescribing 5040 cGy in 28 sessions.  She is treated with both 6 MV/10 MV photons.

## 2015-05-16 ENCOUNTER — Ambulatory Visit
Admission: RE | Admit: 2015-05-16 | Discharge: 2015-05-16 | Disposition: A | Discharge status: Home / Self Care | Payer: 59 | Source: Ambulatory Visit | Attending: Radiation Oncology | Admitting: Radiation Oncology

## 2015-05-16 DIAGNOSIS — Z510 Encounter for antineoplastic radiation therapy: Secondary | ICD-10-CM | POA: Diagnosis not present

## 2015-05-17 ENCOUNTER — Ambulatory Visit
Admission: RE | Admit: 2015-05-17 | Discharge: 2015-05-17 | Disposition: A | Discharge status: Home / Self Care | Payer: 59 | Source: Ambulatory Visit | Attending: Radiation Oncology | Admitting: Radiation Oncology

## 2015-05-17 DIAGNOSIS — Z510 Encounter for antineoplastic radiation therapy: Secondary | ICD-10-CM | POA: Diagnosis not present

## 2015-05-18 ENCOUNTER — Ambulatory Visit
Admission: RE | Admit: 2015-05-18 | Discharge: 2015-05-18 | Disposition: A | Discharge status: Home / Self Care | Payer: 59 | Source: Ambulatory Visit | Attending: Radiation Oncology | Admitting: Radiation Oncology

## 2015-05-18 DIAGNOSIS — Z510 Encounter for antineoplastic radiation therapy: Secondary | ICD-10-CM | POA: Diagnosis not present

## 2015-05-19 ENCOUNTER — Ambulatory Visit
Admission: RE | Admit: 2015-05-19 | Discharge: 2015-05-19 | Disposition: A | Discharge status: Home / Self Care | Payer: 59 | Source: Ambulatory Visit | Attending: Radiation Oncology | Admitting: Radiation Oncology

## 2015-05-19 DIAGNOSIS — Z510 Encounter for antineoplastic radiation therapy: Secondary | ICD-10-CM | POA: Diagnosis not present

## 2015-05-20 ENCOUNTER — Ambulatory Visit
Admission: RE | Admit: 2015-05-20 | Discharge: 2015-05-20 | Disposition: A | Discharge status: Home / Self Care | Payer: 59 | Source: Ambulatory Visit | Attending: Radiation Oncology | Admitting: Radiation Oncology

## 2015-05-20 DIAGNOSIS — Z510 Encounter for antineoplastic radiation therapy: Secondary | ICD-10-CM | POA: Diagnosis not present

## 2015-05-24 ENCOUNTER — Encounter: Payer: Self-pay | Admitting: Radiation Oncology

## 2015-05-24 ENCOUNTER — Ambulatory Visit
Admission: RE | Admit: 2015-05-24 | Discharge: 2015-05-24 | Disposition: A | Discharge status: Home / Self Care | Payer: 59 | Source: Ambulatory Visit | Attending: Radiation Oncology | Admitting: Radiation Oncology

## 2015-05-24 ENCOUNTER — Telehealth: Payer: Self-pay | Admitting: *Deleted

## 2015-05-24 VITALS — BP 123/78 | HR 69 | Resp 16 | Wt 191.8 lb

## 2015-05-24 DIAGNOSIS — Z510 Encounter for antineoplastic radiation therapy: Secondary | ICD-10-CM | POA: Diagnosis not present

## 2015-05-24 DIAGNOSIS — C50912 Malignant neoplasm of unspecified site of left female breast: Secondary | ICD-10-CM | POA: Diagnosis not present

## 2015-05-24 DIAGNOSIS — L539 Erythematous condition, unspecified: Secondary | ICD-10-CM | POA: Diagnosis not present

## 2015-05-24 DIAGNOSIS — R2232 Localized swelling, mass and lump, left upper limb: Secondary | ICD-10-CM | POA: Insufficient documentation

## 2015-05-24 MED ORDER — ALRA NON-METALLIC DEODORANT (RAD-ONC)
1.0000 "application " | Freq: Once | TOPICAL | Status: AC
  Administered 2015-05-24: 1 via TOPICAL

## 2015-05-24 MED ORDER — RADIAPLEXRX EX GEL
Freq: Once | CUTANEOUS | Status: AC
  Administered 2015-05-24: 13:00:00 via TOPICAL

## 2015-05-24 NOTE — Progress Notes (Signed)
Weight and vitals stable. Denies fatigue. Denies pain. No skin changes noted within treatment field. Post sim education completed today.  BP 123/78 mmHg  Pulse 69  Resp 16  Wt 191 lb 12.8 oz (87 kg)  LMP 07/20/2013 (Approximate) Wt Readings from Last 3 Encounters:  05/24/15 191 lb 12.8 oz (87 kg)  04/19/15 195 lb 1.6 oz (88.497 kg)  04/12/15 197 lb 12.8 oz (89.721 kg)   Oriented patient to staff and routine of the clinic. Provided patient with RADIATION THERAPY AND YOU handbook then, reviewed pertinent information. Educated patient reference potential side effects and management such as, fatigue and skin changes. Provided patient with alra and radiaplex then, directed upon use. Patient verbalized understanding of all reviewed.

## 2015-05-24 NOTE — Progress Notes (Signed)
Weekly Management Note:  Site: Left breast/left/supraclavicular region Current Dose:  900  cGy Projected Dose: 5040  cGy  Narrative: The patient is seen today for routine under treatment assessment. CBCT/MVCT images/port films were reviewed. The chart was reviewed.   She is without complaints today.  She underwent patient education today.  She now has Radioplex gel to use as needed.  Physical Examination:  Filed Vitals:   05/24/15 1240  BP: 123/78  Pulse: 69  Resp: 16  .  Weight: 191 lb 12.8 oz (87 kg).  There is faint erythema along the left breast with no areas of desquamation.  Impression: Tolerating radiation therapy well.  Plan: Continue radiation therapy as planned.

## 2015-05-24 NOTE — Telephone Encounter (Signed)
Spoke to pt to assess needs during xrt. Relate she is doing well and is without complaints. Encourage pt to call with questions or concerns. Received verbal understanding.

## 2015-05-25 ENCOUNTER — Ambulatory Visit
Admission: RE | Admit: 2015-05-25 | Discharge: 2015-05-25 | Disposition: A | Discharge status: Home / Self Care | Payer: 59 | Source: Ambulatory Visit | Attending: Radiation Oncology | Admitting: Radiation Oncology

## 2015-05-25 DIAGNOSIS — Z510 Encounter for antineoplastic radiation therapy: Secondary | ICD-10-CM | POA: Diagnosis not present

## 2015-05-26 ENCOUNTER — Ambulatory Visit
Admission: RE | Admit: 2015-05-26 | Discharge: 2015-05-26 | Disposition: A | Discharge status: Home / Self Care | Payer: 59 | Source: Ambulatory Visit | Attending: Radiation Oncology | Admitting: Radiation Oncology

## 2015-05-26 DIAGNOSIS — Z510 Encounter for antineoplastic radiation therapy: Secondary | ICD-10-CM | POA: Diagnosis not present

## 2015-05-27 ENCOUNTER — Ambulatory Visit
Admission: RE | Admit: 2015-05-27 | Discharge: 2015-05-27 | Disposition: A | Discharge status: Home / Self Care | Payer: 59 | Source: Ambulatory Visit | Attending: Radiation Oncology | Admitting: Radiation Oncology

## 2015-05-27 DIAGNOSIS — Z510 Encounter for antineoplastic radiation therapy: Secondary | ICD-10-CM | POA: Diagnosis not present

## 2015-05-30 ENCOUNTER — Ambulatory Visit
Admission: RE | Admit: 2015-05-30 | Discharge: 2015-05-30 | Disposition: A | Discharge status: Home / Self Care | Payer: 59 | Source: Ambulatory Visit | Attending: Radiation Oncology | Admitting: Radiation Oncology

## 2015-05-30 VITALS — BP 115/83 | HR 65 | Temp 98.3°F | Wt 193.5 lb

## 2015-05-30 DIAGNOSIS — Z510 Encounter for antineoplastic radiation therapy: Secondary | ICD-10-CM | POA: Diagnosis not present

## 2015-05-30 DIAGNOSIS — C50912 Malignant neoplasm of unspecified site of left female breast: Secondary | ICD-10-CM

## 2015-05-30 NOTE — Progress Notes (Signed)
   Weekly Management Note:  outpatient    ICD-9-CM ICD-10-CM   1. Breast cancer, left breast 174.9 C50.912     Current Dose:  16.2 Gy  Projected Dose: 50.4 Gy   Narrative:  The patient presents for routine under treatment assessment.  CBCT/MVCT images/Port film x-rays were reviewed.  The chart was checked. Doing well.  Physical Findings:  weight is 193 lb 8 oz (87.771 kg). Her temperature is 98.3 F (36.8 C). Her blood pressure is 115/83 and her pulse is 65.   Wt Readings from Last 3 Encounters:  05/30/15 193 lb 8 oz (87.771 kg)  05/24/15 191 lb 12.8 oz (87 kg)  04/19/15 195 lb 1.6 oz (88.497 kg)    mild hyperpigmentation of left breast  Impression:  The patient is tolerating radiotherapy.  Plan:  Continue radiotherapy as planned.    ________________________________   Eppie Gibson, M.D.

## 2015-05-30 NOTE — Progress Notes (Signed)
Weekly assessment of radiation to left breast.Completed 9 of 28 treatments.No boost.Mild tanning of skin.Chronic back pain.Continue application of radiaplex twice daily.

## 2015-05-31 ENCOUNTER — Ambulatory Visit
Admission: RE | Admit: 2015-05-31 | Discharge: 2015-05-31 | Disposition: A | Discharge status: Home / Self Care | Payer: 59 | Source: Ambulatory Visit | Attending: Radiation Oncology | Admitting: Radiation Oncology

## 2015-05-31 DIAGNOSIS — Z510 Encounter for antineoplastic radiation therapy: Secondary | ICD-10-CM | POA: Diagnosis not present

## 2015-06-01 ENCOUNTER — Encounter: Payer: Self-pay | Admitting: *Deleted

## 2015-06-01 ENCOUNTER — Ambulatory Visit
Admission: RE | Admit: 2015-06-01 | Discharge: 2015-06-01 | Disposition: A | Discharge status: Home / Self Care | Payer: 59 | Source: Ambulatory Visit | Attending: Radiation Oncology | Admitting: Radiation Oncology

## 2015-06-01 DIAGNOSIS — Z510 Encounter for antineoplastic radiation therapy: Secondary | ICD-10-CM | POA: Diagnosis not present

## 2015-06-01 NOTE — Progress Notes (Signed)
This RN placed Disability paperwork in managed cares box.

## 2015-06-02 ENCOUNTER — Ambulatory Visit
Admission: RE | Admit: 2015-06-02 | Discharge: 2015-06-02 | Disposition: A | Discharge status: Home / Self Care | Payer: 59 | Source: Ambulatory Visit | Attending: Radiation Oncology | Admitting: Radiation Oncology

## 2015-06-02 ENCOUNTER — Encounter: Payer: Self-pay | Admitting: Oncology

## 2015-06-02 ENCOUNTER — Encounter: Payer: Self-pay | Admitting: Radiation Oncology

## 2015-06-02 DIAGNOSIS — Z510 Encounter for antineoplastic radiation therapy: Secondary | ICD-10-CM | POA: Diagnosis not present

## 2015-06-02 NOTE — Progress Notes (Signed)
Medical records requested by [facility] faxed to them (consult, CT SIM note, latest undertreat, and imaging) [phone].

## 2015-06-02 NOTE — Progress Notes (Deleted)
[employer] paperwork that was completed by Dr. Sondra Come was faxed successfully. Originals mailed to the patient. Copies are scanned.

## 2015-06-02 NOTE — Progress Notes (Signed)
I faxed notes/labs from 05/2013 to present  [phone]--Janice Borner

## 2015-06-03 ENCOUNTER — Ambulatory Visit
Admission: RE | Admit: 2015-06-03 | Discharge: 2015-06-03 | Disposition: A | Discharge status: Home / Self Care | Payer: 59 | Source: Ambulatory Visit | Attending: Radiation Oncology | Admitting: Radiation Oncology

## 2015-06-03 DIAGNOSIS — Z510 Encounter for antineoplastic radiation therapy: Secondary | ICD-10-CM | POA: Diagnosis not present

## 2015-06-06 ENCOUNTER — Ambulatory Visit
Admission: RE | Admit: 2015-06-06 | Discharge: 2015-06-06 | Disposition: A | Discharge status: Home / Self Care | Payer: 59 | Source: Ambulatory Visit | Attending: Radiation Oncology | Admitting: Radiation Oncology

## 2015-06-06 DIAGNOSIS — C50912 Malignant neoplasm of unspecified site of left female breast: Secondary | ICD-10-CM | POA: Diagnosis not present

## 2015-06-06 DIAGNOSIS — Z510 Encounter for antineoplastic radiation therapy: Secondary | ICD-10-CM | POA: Diagnosis not present

## 2015-06-06 MED ORDER — RADIAPLEXRX EX GEL
Freq: Once | CUTANEOUS | Status: AC
  Administered 2015-06-06: 10:00:00 via TOPICAL

## 2015-06-06 NOTE — Addendum Note (Signed)
Encounter addended by: Jenene Slicker, RN on: 06/06/2015  9:51 AM<BR>     Documentation filed: [employer], Inpatient MAR, Orders

## 2015-06-06 NOTE — Progress Notes (Signed)
Pt here for follow up appointment with Dr. Valere Dross.  No changes to medication or allergies.  Symptoms remain unchanged since last visit.  Pt requesting more Radiaplex.  Cream given.

## 2015-06-06 NOTE — Progress Notes (Signed)
Weekly Management Note:  Site: Left breast/left axilla supraclavicular region Current Dose:  2520  cGy Projected Dose: 5040  cGy  Narrative: The patient is seen today for routine under treatment assessment. CBCT/MVCT images/port films were reviewed. The chart was reviewed.   She is without complaints today.  She uses Radioplex gel.  Physical Examination: There were no vitals filed for this visit..  Weight:  .  Mild hyperpigmentation along the left axilla/breast.  No areas of desquamation.  Impression: Tolerating radiation therapy well.  Plan: Continue radiation therapy as planned.

## 2015-06-07 ENCOUNTER — Ambulatory Visit
Admission: RE | Admit: 2015-06-07 | Discharge: 2015-06-07 | Disposition: A | Discharge status: Home / Self Care | Payer: 59 | Source: Ambulatory Visit | Attending: Radiation Oncology | Admitting: Radiation Oncology

## 2015-06-07 DIAGNOSIS — Z510 Encounter for antineoplastic radiation therapy: Secondary | ICD-10-CM | POA: Diagnosis not present

## 2015-06-08 ENCOUNTER — Ambulatory Visit
Admission: RE | Admit: 2015-06-08 | Discharge: 2015-06-08 | Disposition: A | Discharge status: Home / Self Care | Payer: 59 | Source: Ambulatory Visit | Attending: Radiation Oncology | Admitting: Radiation Oncology

## 2015-06-08 DIAGNOSIS — Z510 Encounter for antineoplastic radiation therapy: Secondary | ICD-10-CM | POA: Diagnosis not present

## 2015-06-08 DIAGNOSIS — C50912 Malignant neoplasm of unspecified site of left female breast: Secondary | ICD-10-CM

## 2015-06-08 NOTE — Progress Notes (Signed)
Clinic note: The patient is seen today starting her fourth week of radiation therapy to her left breast and regional lymph nodes.  After her treatment yesterday she states that she starts developed discomfort along her left hand, thenar eminence.  She is not sure if this was related to the position that she was in yesterday.  This has persisted through the night and she is seen today.  When asked about whether not she has neck discomfort she states that she did have mild left neck discomfort earlier this morning but this has dissipated.  No shoulder pain.  There is no radiation of pain to the left hand or numbness.  Of note is that she tells me that she was placed on gabapentin because of a herniated lumbar disc.  She will be applying for long-term disability because of her low back pain.  On examination today she has excellent left hand strength with no weakness appreciated.  There is discomfort described along the thenar eminence.  Sensation is normal to light touch.  Left arm strength is also excellent.  Impression: I am at a loss to explain her left thenar discomfort.  I think we can monitor this over the next few days.  If this progresses then we will need to have her see a hand surgeon or go back to see her neurosurgeon.

## 2015-06-08 NOTE — Progress Notes (Signed)
Robin Franklin reports aching and in her left hand adjacent to her thumb throughout the night which disturbed her sleep. Informed her that this may be occurring due to the numerous axillary nodes that were removed during her surgery and will gradually dissipate in the future. Also note slight, weaker grip in her left hand.

## 2015-06-09 ENCOUNTER — Ambulatory Visit
Admission: RE | Admit: 2015-06-09 | Discharge: 2015-06-09 | Disposition: A | Discharge status: Home / Self Care | Payer: 59 | Source: Ambulatory Visit | Attending: Radiation Oncology | Admitting: Radiation Oncology

## 2015-06-09 DIAGNOSIS — Z510 Encounter for antineoplastic radiation therapy: Secondary | ICD-10-CM | POA: Diagnosis not present

## 2015-06-10 ENCOUNTER — Ambulatory Visit
Admission: RE | Admit: 2015-06-10 | Discharge: 2015-06-10 | Disposition: A | Discharge status: Home / Self Care | Payer: 59 | Source: Ambulatory Visit | Attending: Radiation Oncology | Admitting: Radiation Oncology

## 2015-06-10 DIAGNOSIS — Z510 Encounter for antineoplastic radiation therapy: Secondary | ICD-10-CM | POA: Diagnosis not present

## 2015-06-13 ENCOUNTER — Ambulatory Visit (HOSPITAL_BASED_OUTPATIENT_CLINIC_OR_DEPARTMENT_OTHER): Payer: 59

## 2015-06-13 ENCOUNTER — Other Ambulatory Visit (HOSPITAL_BASED_OUTPATIENT_CLINIC_OR_DEPARTMENT_OTHER): Payer: 59

## 2015-06-13 ENCOUNTER — Ambulatory Visit (HOSPITAL_BASED_OUTPATIENT_CLINIC_OR_DEPARTMENT_OTHER): Payer: 59 | Admitting: Oncology

## 2015-06-13 ENCOUNTER — Ambulatory Visit
Admission: RE | Admit: 2015-06-13 | Discharge: 2015-06-13 | Disposition: A | Discharge status: Home / Self Care | Payer: 59 | Source: Ambulatory Visit | Attending: Radiation Oncology | Admitting: Radiation Oncology

## 2015-06-13 ENCOUNTER — Encounter: Payer: Self-pay | Admitting: *Deleted

## 2015-06-13 ENCOUNTER — Telehealth: Payer: Self-pay | Admitting: Oncology

## 2015-06-13 VITALS — BP 119/85 | HR 58 | Temp 98.1°F | Resp 12 | Wt 192.8 lb

## 2015-06-13 VITALS — BP 136/81 | HR 60 | Temp 98.7°F | Resp 18 | Ht 67.0 in | Wt 191.8 lb

## 2015-06-13 DIAGNOSIS — C50612 Malignant neoplasm of axillary tail of left female breast: Secondary | ICD-10-CM

## 2015-06-13 DIAGNOSIS — C50912 Malignant neoplasm of unspecified site of left female breast: Secondary | ICD-10-CM

## 2015-06-13 DIAGNOSIS — Z510 Encounter for antineoplastic radiation therapy: Secondary | ICD-10-CM | POA: Diagnosis not present

## 2015-06-13 DIAGNOSIS — C773 Secondary and unspecified malignant neoplasm of axilla and upper limb lymph nodes: Secondary | ICD-10-CM | POA: Diagnosis not present

## 2015-06-13 DIAGNOSIS — Z171 Estrogen receptor negative status [ER-]: Secondary | ICD-10-CM

## 2015-06-13 DIAGNOSIS — Z452 Encounter for adjustment and management of vascular access device: Secondary | ICD-10-CM

## 2015-06-13 DIAGNOSIS — R2232 Localized swelling, mass and lump, left upper limb: Secondary | ICD-10-CM

## 2015-06-13 LAB — COMPREHENSIVE METABOLIC PANEL (CC13)
ALBUMIN: 4.1 g/dL (ref 3.5–5.0)
ALK PHOS: 65 U/L (ref 40–150)
ALT: 16 U/L (ref 0–55)
AST: 18 U/L (ref 5–34)
Anion Gap: 9 mEq/L (ref 3–11)
BUN: 10.7 mg/dL (ref 7.0–26.0)
CO2: 25 meq/L (ref 22–29)
Calcium: 9.3 mg/dL (ref 8.4–10.4)
Chloride: 112 mEq/L — ABNORMAL HIGH (ref 98–109)
Creatinine: 0.8 mg/dL (ref 0.6–1.1)
Glucose: 87 mg/dl (ref 70–140)
Potassium: 4.1 mEq/L (ref 3.5–5.1)
Sodium: 146 mEq/L — ABNORMAL HIGH (ref 136–145)
Total Bilirubin: 0.32 mg/dL (ref 0.20–1.20)
Total Protein: 6.8 g/dL (ref 6.4–8.3)

## 2015-06-13 LAB — CBC WITH DIFFERENTIAL/PLATELET
BASO%: 1.2 % (ref 0.0–2.0)
Basophils Absolute: 0 10*3/uL (ref 0.0–0.1)
EOS ABS: 0.1 10*3/uL (ref 0.0–0.5)
EOS%: 3.5 % (ref 0.0–7.0)
HEMATOCRIT: 39.1 % (ref 34.8–46.6)
HGB: 12.8 g/dL (ref 11.6–15.9)
LYMPH%: 21 % (ref 14.0–49.7)
MCH: 28.4 pg (ref 25.1–34.0)
MCHC: 32.8 g/dL (ref 31.5–36.0)
MCV: 86.5 fL (ref 79.5–101.0)
MONO#: 0.4 10*3/uL (ref 0.1–0.9)
MONO%: 13.4 % (ref 0.0–14.0)
NEUT#: 1.8 10*3/uL (ref 1.5–6.5)
NEUT%: 60.9 % (ref 38.4–76.8)
Platelets: 188 10*3/uL (ref 145–400)
RBC: 4.52 10*6/uL (ref 3.70–5.45)
RDW: 13.4 % (ref 11.2–14.5)
WBC: 3 10*3/uL — ABNORMAL LOW (ref 3.9–10.3)
lymph#: 0.6 10*3/uL — ABNORMAL LOW (ref 0.9–3.3)

## 2015-06-13 MED ORDER — SODIUM CHLORIDE 0.9 % IJ SOLN
10.0000 mL | INTRAMUSCULAR | Status: DC | PRN
  Administered 2015-06-13: 10 mL via INTRAVENOUS
  Filled 2015-06-13: qty 10

## 2015-06-13 MED ORDER — HEPARIN SOD (PORK) LOCK FLUSH 100 UNIT/ML IV SOLN
500.0000 [IU] | Freq: Once | INTRAVENOUS | Status: AC
  Administered 2015-06-13: 500 [IU] via INTRAVENOUS
  Filled 2015-06-13: qty 5

## 2015-06-13 NOTE — Patient Instructions (Signed)

## 2015-06-13 NOTE — Progress Notes (Signed)
Robin Franklin  Telephone:(336) [phone] Fax:(336) [phone]    ID: [name] DOB: Jul 24, 1961  MR#: [id number]  AJG#:[id number]  Patient Care Team: Redmond School, MD as PCP - General (Internal Medicine) Stark Klein, MD as Consulting Physician (General Surgery) Chauncey Cruel, MD as Consulting Physician (Oncology) Arloa Koh, MD as Consulting Physician (Radiation Oncology) Vania Rea, MD as Consulting Physician (Obstetrics and Gynecology) Newman Pies, MD as Consulting Physician (Neurosurgery) Angelina Ok, MD as Referring Physician (Surgery) OTHER MD:  CHIEF COMPLAINT: Triple negative breast cancer  CURRENT TREATMENT: Adjuvant chemotherapy  BREAST CANCER HISTORY: From the original intake note:  Emaly herself noted a mass in her left axilla sometime in late November or she brought it to Dr. Nolon Rod attention and on 11/15/2014 she had a left mammogram and left ultrasound at the breast Center. The breast density was category 8. The left breast was entirely unremarkable, with no masses, distortion, or calcifications. In the left axilla there was a mass which was palpable and moderately firm. By ultrasound it measured 3.2 cm. There were no other axillary masses of concern.  Biopsy of the left axillary mass the same day showed ([id number]) a high-grade carcinoma which was estrogen and progesterone receptor negative, HER-2 negative, with a signals ratio of 1.53 and the number per cell of 2.60, with an MIB-1 of 95%. It was also gross cystic disease fluid protein negative, and negative for cytokeratin 20 and TTF-1. It was positive for cytokeratin 7. This was felt to be consistent with a breast primary, but gynecologic and other malignancies were also in the differential.  In general 04/08/2015 the patient had a diagnostic right mammogram, which was negative. On the same day she had bilateral breast MRI. There were no masses or abnormal enhancement in either breast. In  the left axilla there was a round enhancing mass measuring 4.6 cm. There were mildly prominent smaller immediately adjacent lymph nodes. He was no right axillary or internal mammary adenopathy. An incidental lipoma was noted in the left lateral chest wall.  Her subsequent history is as detailed below.  INTERVAL HISTORY:  [name] returns today to the breast clinic for follow-up of her triple negative breast cancer, accompanied by her brother and grandson. She is currently undergoing radiation, which she is generally tolerating well. Specifically she tells me her skin is holding up. She does have significant fatigue.   REVIEW OF SYSTEMS: The big problem is her back. She can't bend over my and cannot really do much in terms of household chores, much less work. She is on temporary disability but tells me this will be extended because she will not be able to have her surgery until she recovers from her current treatment and that will be sometime in October according to her neurosurgeon. In addition she complains of both night sweats and hot flashes. Sometimes she has blurred vision. She has ringing in her years. She has palpitations. She has difficulty walking and great difficulty walking up stairs. She is treating her pain chiefly with Aleve. She feels weak, numb, and anxious. A detailed review of systems is otherwise stable  PAST MEDICAL HISTORY: Past Medical History  Diagnosis Date  . Hypothyroidism   . Headache(784.0)   . Anxiety   . Breast cancer   . Arthritis   . Joint pain     PAST SURGICAL HISTORY: Past Surgical History  Procedure Laterality Date  . Breast reduction surgery    . Cervical cone biopsy    . Cyst  removed from neck    . Colonoscopy  11/06/2011    Procedure: COLONOSCOPY;  Surgeon: Dorothyann Peng, MD;  Location: [facility];  Service: Endoscopy;  Laterality: N/A;  11:30 AM  . Steriod injection      FAMILY HISTORY Family History  Problem Relation Age of Onset  . Colon  cancer Neg Hx   . Breast cancer Mother 52  the patient's father died from a myocardial infarction at the age of 70. [name] died from breast cancer at the age of 39. It was diagnosed at age 59. The patient has 2 brothers, one sister. There is no other history of breast or ovarian cancer in the family to her knowledge.  GYNECOLOGIC HISTORY:  Patient's last menstrual period was 07/20/2013 (approximate). Menarche age 16, first live birth age 66. The patient is GX P2. She used oral contraceptives for a few months remotely with no side effects. She stopped having periods in September 2014. 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 [name] is a Merchant navy officer with [employer]. At home it's just the 2 of them, with no pets. Aylssa's children from an earlier marriage are [name]. [name], who lives in [place] and is a Public relations account executive; and [name], who lives in [place] and is a Biochemist, clinical. The patient has 4 grandchildren +2 by adoption. She attends a [employer] locally    ADVANCED DIRECTIVES: Not in place   HEALTH MAINTENANCE: History  Substance Use Topics  . Smoking status: Never Smoker   . Smokeless tobacco: Not on file  . Alcohol Use: No     Colonoscopy: 2013/ [facility]  PAP: February 2015  Bone density: Never  Lipid panel:  Allergies  Allergen Reactions  . Other Rash    Unknown Flu Vaccine from 2015 caused rash    Current Outpatient Prescriptions  Medication Sig Dispense Refill  . ALPRAZolam (XANAX) 0.5 MG tablet Take 0.5 mg by mouth daily as needed for anxiety. For anxiety    . Cyanocobalamin (VITAMIN B 12 PO) Take 1 tablet by mouth daily.    . cyclobenzaprine (FLEXERIL) 10 MG tablet Take 10 mg by mouth as needed.   0  . gabapentin (NEURONTIN) 100 MG capsule Take 1 capsule (100 mg total) by mouth 2 (two) times daily. 60 capsule 1  . HYDROcodone-acetaminophen  (NORCO/VICODIN) 5-325 MG per tablet     . ibuprofen (ADVIL,MOTRIN) 200 MG tablet Take 400 mg by mouth every 6 (six) hours as needed for moderate pain.    Marland Kitchen levothyroxine (SYNTHROID, LEVOTHROID) 75 MCG tablet Take 75 mcg by mouth daily before breakfast.    . lidocaine-prilocaine (EMLA) cream Apply 1 application topically as needed. 30 g 3  . Melatonin 3 MG TABS Take 3 mg by mouth at bedtime as needed (Sleep).    . Multiple Vitamin (MULTIVITAMIN WITH MINERALS) TABS tablet Take 1 tablet by mouth daily.    . naproxen sodium (ANAPROX) 220 MG tablet Take 220 mg by mouth 2 (two) times daily with a meal.    . non-metallic deodorant (ALRA) MISC Apply 1 application topically daily as needed.    . Omega-3 Fatty Acids (FISH OIL) 1000 MG CAPS Take 1 capsule by mouth daily.    Marland Kitchen UNABLE TO FIND Apply 1 application topically 4 (four) times daily. Keto%/Bac2%/Gab5%/Lido5%    . VITAMIN D, ERGOCALCIFEROL, PO Take 5,000 Units by mouth once a week.    Marland Kitchen  Wound Cleansers (RADIAPLEX EX) Apply topically.     No current facility-administered medications for this visit.   Facility-Administered Medications Ordered in Other Visits  Medication Dose Route Frequency Provider Last Rate Last Dose  . sodium chloride 0.9 % injection 10 mL  10 mL Intravenous PRN Chauncey Cruel, MD   10 mL at 06/13/15 6546    OBJECTIVE: Middle-aged African-American woman who appears stated age 54 Vitals:   06/13/15 1009  BP: 136/81  Pulse: 60  Temp: 98.7 F (37.1 C)  Resp: 18     Body mass index is 30.03 kg/(m^2).    ECOG FS:2 - Symptomatic, <50% confined to bed   Sclerae unicteric, EOMs intact Oropharynx clear, dentition in good repair No cervical or supraclavicular adenopathy Lungs no rales or rhonchi Heart regular rate and rhythm Abd soft, nontender, positive bowel sounds MSK  no upper extremity lymphedema Neuro: nonfocal, well oriented, appropriate affect Breasts: The right breast is unremarkable. The left breast is  undergoing radiation. There is some skin edema but no significant hyperpigmentation and no desquamation. The left axilla is benign, with some numbness but no masses palpated  LAB RESULTS:  CMP     Component Value Date/Time   NA 146* 06/13/2015 0936   NA 142 01/13/2014 1928   K 4.1 06/13/2015 0936   K 3.5* 01/13/2014 1928   CL 103 01/13/2014 1928   CO2 25 06/13/2015 0936   CO2 25 12/23/2010 1229   GLUCOSE 87 06/13/2015 0936   GLUCOSE 82 01/13/2014 1928   BUN 10.7 06/13/2015 0936   BUN 14 01/13/2014 1928   CREATININE 0.8 06/13/2015 0936   CREATININE 1.00 01/13/2014 1928   CALCIUM 9.3 06/13/2015 0936   CALCIUM 9.1 12/23/2010 1229   PROT 6.8 06/13/2015 0936   ALBUMIN 4.1 06/13/2015 0936   AST 18 06/13/2015 0936   ALT 16 06/13/2015 0936   ALKPHOS 65 06/13/2015 0936   BILITOT 0.32 06/13/2015 0936   GFRNONAA >60 12/23/2010 1229   GFRAA  12/23/2010 1229    >60        The eGFR has been calculated using the MDRD equation. This calculation has not been validated in all clinical situations. eGFR's persistently <60 mL/min signify possible Chronic Kidney Disease.    INo results found for: SPEP, UPEP  Lab Results  Component Value Date   WBC 3.0* 06/13/2015   NEUTROABS 1.8 06/13/2015   HGB 12.8 06/13/2015   HCT 39.1 06/13/2015   MCV 86.5 06/13/2015   PLT 188 06/13/2015      Chemistry      Component Value Date/Time   NA 146* 06/13/2015 0936   NA 142 01/13/2014 1928   K 4.1 06/13/2015 0936   K 3.5* 01/13/2014 1928   CL 103 01/13/2014 1928   CO2 25 06/13/2015 0936   CO2 25 12/23/2010 1229   BUN 10.7 06/13/2015 0936   BUN 14 01/13/2014 1928   CREATININE 0.8 06/13/2015 0936   CREATININE 1.00 01/13/2014 1928      Component Value Date/Time   CALCIUM 9.3 06/13/2015 0936   CALCIUM 9.1 12/23/2010 1229   ALKPHOS 65 06/13/2015 0936   AST 18 06/13/2015 0936   ALT 16 06/13/2015 0936   BILITOT 0.32 06/13/2015 0936       No results found for: LABCA2  No components  found for: [id number]  No results for input(s): INR in the last 168 hours.  Urinalysis    Component Value Date/Time   COLORURINE YELLOW 01/13/2014 1924   APPEARANCEUR  CLEAR 01/13/2014 1924   LABSPEC >1.030* 01/13/2014 1924   PHURINE 5.5 01/13/2014 1924   GLUCOSEU NEGATIVE 01/13/2014 1924   HGBUR NEGATIVE 01/13/2014 1924   BILIRUBINUR NEGATIVE 01/13/2014 1924   KETONESUR NEGATIVE 01/13/2014 1924   PROTEINUR NEGATIVE 01/13/2014 1924   UROBILINOGEN 0.2 01/13/2014 1924   NITRITE NEGATIVE 01/13/2014 1924   LEUKOCYTESUR NEGATIVE 01/13/2014 1924    STUDIES: No results found.  ASSESSMENT: 54 y.o. [place] woman status post biopsy 11/15/2014 of a 4.6 cm left axillary mass showing a poorly differentiated carcinoma, triple negative, with an MIB-1 of 90%.  (1) s/p left axillary lymph node dissection under Dr.Howard-McNatt 12/23/2014 with 1 of 14 lymph nodes positive; TX N1, stage II; repeat prognostic panel again triple negative  (2) adjuvant chemotherapy started 01/17/2015, consisting of cyclophosphamide and doxorubicin 4 given in dose dense fashion, completed 02/28/2015, followed by paclitaxel and carboplatin given weekly with 12 cycles planned, but stopped after only 3 cycles because of progressive neuropathy, last dose 03/28/2015  (3) radiation to be completed 06/24/2015.  (4) genetics counseling canceled by patient, to be rescheduled at patient's discretion  PLAN: Cheronda will complete her radiation treatments next week. She will then be ready to have her port removed, and that can be done by her surgeon Dr. Alvan Dame at [facility] probably during her next visit.  The patient has significant back problems which are disabling to her. She is not going to be able to have surgery for this under Dr. Arnoldo Morale until late October. She tells me her disability will be extended through that.  From our point of view, we are moving towards long-term follow-up. This is an unusual case at high-risk for  recurrence. She will have a chest CT scan before she sees me in the early part of October. She will have bilateral breast MRI and I lateral mammography December 29 and December 30. She will then see me again in January. At that point she will start long-term follow-up  She knows to call for any problems that may develop before her next visit here.   Chauncey Cruel, MD   06/13/2015 10:45 AM  Medical Oncology and Hematology [facility] [address] [place], [place] [address] Tel. [phone]    Fax. [phone]

## 2015-06-13 NOTE — Telephone Encounter (Signed)
Patient had stated this morning that she would use my chart to get her appointments that dr Jana Hakim  requested

## 2015-06-13 NOTE — Progress Notes (Signed)
PAIN: She is currently in no pain.  SKIN: Pt left breast- warm dry and intact, slight hyperpigmentation.  Pt denies edema.  Pt continues to apply Radiaplex as directed. OTHER: Pt complains of weakness. BP 119/85 mmHg  Pulse 58  Temp(Src) 98.1 F (36.7 C) (Oral)  Resp 12  Wt 192 lb 12.8 oz (87.454 kg)  SpO2 100%  LMP 07/20/2013 (Approximate) Wt Readings from Last 3 Encounters:  06/13/15 192 lb 12.8 oz (87.454 kg)  05/30/15 193 lb 8 oz (87.771 kg)  05/24/15 191 lb 12.8 oz (87 kg)

## 2015-06-13 NOTE — Progress Notes (Signed)
[place] [employer] representative met with patient in office at [facility] to offer support and assess for needs.  Patient expressed financial concerns and stress of increased medical bills since beginning treatment.  Patient stated she had applied for assistance through [facility] but did not qualify.  CSW and patient reviewed resources in the community and other organizations.  CSW provided financial assistance applications for [employer], [employer], and contact information for the [employer].  Patient plans to complete patient sections of the applications and gather requested information.  Patient will then meet with CSw to review and submit.  CSW provided contact information and encouraged patient to call with any questions or concerns.   Johnnye Lana, MSW, LCSW, OSW-C Clinical Social Worker [facility] [phone]

## 2015-06-13 NOTE — Progress Notes (Signed)
Weekly Management Note:  Site: Left breast/regional lymph nodes Current Dose:  3420  cGy Projected Dose: 5040  cGy  Narrative: The patient is seen today for routine under treatment assessment. CBCT/MVCT images/port films were reviewed. The chart was reviewed.   She is without new complaints today.  She is tried to continue her long-term disability for chronic low back pain through Dr. Arnoldo Morale.  Her back pain is keeping her from working.  She uses Radioplex gel.  Physical Examination:  Filed Vitals:   06/13/15 0859  BP: 119/85  Pulse: 58  Temp: 98.1 F (36.7 C)  Resp: 12  .  Weight: 192 lb 12.8 oz (87.454 kg).  There is mild to moderate hyperpigmentation the skin along the left breast and axilla/clavicular region with focal dry desquamation along her left axilla.  Impression: Tolerating radiation therapy well.  Plan: Continue radiation therapy as planned.

## 2015-06-14 ENCOUNTER — Ambulatory Visit
Admission: RE | Admit: 2015-06-14 | Discharge: 2015-06-14 | Disposition: A | Discharge status: Home / Self Care | Payer: 59 | Source: Ambulatory Visit | Attending: Radiation Oncology | Admitting: Radiation Oncology

## 2015-06-14 DIAGNOSIS — Z510 Encounter for antineoplastic radiation therapy: Secondary | ICD-10-CM | POA: Diagnosis not present

## 2015-06-15 ENCOUNTER — Ambulatory Visit
Admission: RE | Admit: 2015-06-15 | Discharge: 2015-06-15 | Disposition: A | Discharge status: Home / Self Care | Payer: 59 | Source: Ambulatory Visit | Attending: Radiation Oncology | Admitting: Radiation Oncology

## 2015-06-15 DIAGNOSIS — Z510 Encounter for antineoplastic radiation therapy: Secondary | ICD-10-CM | POA: Diagnosis not present

## 2015-06-16 ENCOUNTER — Ambulatory Visit
Admission: RE | Admit: 2015-06-16 | Discharge: 2015-06-16 | Disposition: A | Discharge status: Home / Self Care | Payer: 59 | Source: Ambulatory Visit | Attending: Radiation Oncology | Admitting: Radiation Oncology

## 2015-06-16 DIAGNOSIS — Z510 Encounter for antineoplastic radiation therapy: Secondary | ICD-10-CM | POA: Diagnosis not present

## 2015-06-17 ENCOUNTER — Ambulatory Visit
Admission: RE | Admit: 2015-06-17 | Discharge: 2015-06-17 | Disposition: A | Discharge status: Home / Self Care | Payer: 59 | Source: Ambulatory Visit | Attending: Radiation Oncology | Admitting: Radiation Oncology

## 2015-06-17 DIAGNOSIS — Z510 Encounter for antineoplastic radiation therapy: Secondary | ICD-10-CM | POA: Diagnosis not present

## 2015-06-20 ENCOUNTER — Ambulatory Visit
Admission: RE | Admit: 2015-06-20 | Discharge: 2015-06-20 | Disposition: A | Discharge status: Home / Self Care | Payer: 59 | Source: Ambulatory Visit | Attending: Radiation Oncology | Admitting: Radiation Oncology

## 2015-06-20 DIAGNOSIS — Z510 Encounter for antineoplastic radiation therapy: Secondary | ICD-10-CM | POA: Diagnosis not present

## 2015-06-21 ENCOUNTER — Ambulatory Visit
Admission: RE | Admit: 2015-06-21 | Discharge: 2015-06-21 | Disposition: A | Discharge status: Home / Self Care | Payer: 59 | Source: Ambulatory Visit | Attending: Radiation Oncology | Admitting: Radiation Oncology

## 2015-06-21 ENCOUNTER — Encounter: Payer: Self-pay | Admitting: Radiation Oncology

## 2015-06-21 VITALS — BP 112/77 | HR 71 | Temp 98.2°F | Ht 67.0 in | Wt 192.0 lb

## 2015-06-21 DIAGNOSIS — Z510 Encounter for antineoplastic radiation therapy: Secondary | ICD-10-CM | POA: Diagnosis not present

## 2015-06-21 DIAGNOSIS — C50912 Malignant neoplasm of unspecified site of left female breast: Secondary | ICD-10-CM | POA: Diagnosis not present

## 2015-06-21 MED ORDER — RADIAPLEXRX EX GEL
Freq: Once | CUTANEOUS | Status: AC
  Administered 2015-06-21: 10:00:00 via TOPICAL

## 2015-06-21 NOTE — Progress Notes (Signed)
Robin Franklin has received 28 fractions to her left breast.  Note increased hyperpigmentation with intact skin in the axilla and the inframmary fold.  Swelling of breast with erythema anteriorally.  She denies pain but reports heaviness of her left breast.  She also reports fatigue and taking naps after treatment.

## 2015-06-21 NOTE — Progress Notes (Signed)
Weekly Management Note:  Site: Left breast/clavicular/axillary region Current Dose:  4500  cGy Projected Dose: 5040  cGy  Narrative: The patient is seen today for routine under treatment assessment. CBCT/MVCT images/port films were reviewed. The chart was reviewed.   She still doing well although she does have mild fatigue.  She uses Radioplex gel.  Physical Examination:  Filed Vitals:   06/21/15 0908  BP: 112/77  Pulse: 71  Temp: 98.2 F (36.8 C)  .  Weight: 192 lb (87.091 kg).  There is marked hyperpigmentation the skin along the left breast with dry desquamation along the axilla.  No areas of moist desquamation.  Impression: Tolerating radiation therapy well.  I will see her after her final treatment this Friday.  Plan: Continue radiation therapy as planned.  One-month follow-up after completion of radiation therapy this Friday.

## 2015-06-22 ENCOUNTER — Ambulatory Visit
Admission: RE | Admit: 2015-06-22 | Discharge: 2015-06-22 | Disposition: A | Discharge status: Home / Self Care | Payer: 59 | Source: Ambulatory Visit | Attending: Radiation Oncology | Admitting: Radiation Oncology

## 2015-06-22 DIAGNOSIS — Z510 Encounter for antineoplastic radiation therapy: Secondary | ICD-10-CM | POA: Diagnosis not present

## 2015-06-23 ENCOUNTER — Ambulatory Visit
Admission: RE | Admit: 2015-06-23 | Discharge: 2015-06-23 | Disposition: A | Discharge status: Home / Self Care | Payer: 59 | Source: Ambulatory Visit | Attending: Radiation Oncology | Admitting: Radiation Oncology

## 2015-06-23 DIAGNOSIS — Z510 Encounter for antineoplastic radiation therapy: Secondary | ICD-10-CM | POA: Diagnosis not present

## 2015-06-24 ENCOUNTER — Ambulatory Visit
Admission: RE | Admit: 2015-06-24 | Discharge: 2015-06-24 | Disposition: A | Discharge status: Home / Self Care | Payer: 59 | Source: Ambulatory Visit | Attending: Radiation Oncology | Admitting: Radiation Oncology

## 2015-06-24 ENCOUNTER — Encounter: Payer: Self-pay | Admitting: Radiation Oncology

## 2015-06-24 ENCOUNTER — Telehealth: Payer: Self-pay | Admitting: *Deleted

## 2015-06-24 DIAGNOSIS — Z510 Encounter for antineoplastic radiation therapy: Secondary | ICD-10-CM | POA: Diagnosis not present

## 2015-06-24 NOTE — Progress Notes (Signed)
[facility] End of Treatment Note  Name:Robin Franklin  Date: 06/24/2015 XYI:[id number] DOB:May 10, 1961   Status:outpatient    CC: Glo Herring., MD  Dr. Gunnar Bulla Magrinat, Dr. Mable Fill Howard-McNatt,   REFERRING PHYSICIAN: Dr. 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  Mixed 10 MV/6 MV photons to the left supraclavicular/axillary region, RAO to the clavicular/axillary region (10 MV photons) with a PA axillary boost (6 MV photons).                NARRATIVE: Ms. Wurtz tolerated treatment well with impending dry desquamation of the axilla and inframammary region by completion of therapy.  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                           PLAN: Routine followup in one month. Patient instructed to call if questions or worsening complaints in interim.

## 2015-06-24 NOTE — Progress Notes (Signed)
Weekly Management Note:  Site: Left breast/regional lymph nodes Current Dose:  5040  cGy Projected Dose: 5040  cGy  Narrative: The patient is seen today for routine under treatment assessment. CBCT/MVCT images/port films were reviewed. The chart was reviewed.   She is without new complaints today.  She finishes her radiation therapy today.  Physical Examination: There were no vitals filed for this visit..  Weight:  .  There remains marked hyperpigmentation of the skin along the left breast and clavicular region with impending desquamation along the axilla and inframammary region.  Impression: Radiation therapy well tolerated.  Treatment is completed.  Plan: One-month follow-up visit.

## 2015-06-24 NOTE — Telephone Encounter (Signed)
Spoke to pt and congratulate on completing xrt. Relate doing well and without complaints. Encourage pt to call with questions or concerns. Received verbal understanding. Confirmed f/u with Dr. Jana Hakim in October.

## 2015-06-28 ENCOUNTER — Other Ambulatory Visit: Payer: Self-pay | Admitting: Nurse Practitioner

## 2015-06-28 ENCOUNTER — Telehealth: Payer: Self-pay | Admitting: Radiation Oncology

## 2015-06-28 NOTE — Telephone Encounter (Signed)
Returned message left by patient. Patient called to report skin breakdown under left arm. Encouraged patient to continue radiaplex gel bid for two week plus neosporin to area of breakdown. Recognized patient had no follow up appointment with Dr. Valere Dross. Encouraged patient to contact RN with future needs or questions. Transferred patient to Romie Jumper to schedule follow up.

## 2015-07-01 ENCOUNTER — Telehealth: Payer: Self-pay

## 2015-07-01 NOTE — Telephone Encounter (Signed)
Returned call to home number and received VM.  Message left to return call.  Insurance question should be directed to Managed Care-  Pt may proceed with dental and eye appointments.

## 2015-07-01 NOTE — Telephone Encounter (Signed)
Pt called asking if we sent disability papers to [facility]. I noted on 7/14 Raquel faxed labs and OV notes to [employer]. Fax # in 7/14 note is correct. I did not know if OV notes and labs were the same as disability papers for this patient.   Also pt was asking if it is OK to get her teeth and vision checked yet. She finished xrt on 8/5, and she finished chemo on May 9.

## 2015-07-14 ENCOUNTER — Other Ambulatory Visit: Payer: Self-pay | Admitting: Adult Health

## 2015-07-14 DIAGNOSIS — C50912 Malignant neoplasm of unspecified site of left female breast: Secondary | ICD-10-CM

## 2015-07-19 ENCOUNTER — Telehealth: Payer: Self-pay | Admitting: *Deleted

## 2015-07-19 NOTE — Telephone Encounter (Signed)
Returned call to pt. Pt had call b/c she's has received forms from [place] to be completed by [place]. She will be faxing papers to me on tomorrow. Pt also had questions about supplements  She has bought from [facility]. Advised pt to get as many of her vitamins and supplements from food sources and told her omega fatty acid capsules are sufficient  vs the Triple ETA. She is also taking selenium 100 mg. Pt verbalized understanding and agreed.

## 2015-07-27 ENCOUNTER — Encounter: Payer: Self-pay | Admitting: Oncology

## 2015-07-27 NOTE — Progress Notes (Signed)
I placed fmla forms on desk of nurse for dr. magrinat °

## 2015-08-01 ENCOUNTER — Encounter: Payer: Self-pay | Admitting: Oncology

## 2015-08-01 ENCOUNTER — Encounter: Payer: Self-pay | Admitting: Radiation Oncology

## 2015-08-01 NOTE — Progress Notes (Signed)
I placed copy of form faxed for patient at front desk for ms. Robin Franklin for her records.

## 2015-08-01 NOTE — Progress Notes (Signed)
I faxed [employer] leave forms    [phone]

## 2015-08-02 ENCOUNTER — Ambulatory Visit
Admission: RE | Admit: 2015-08-02 | Discharge: 2015-08-02 | Disposition: A | Discharge status: Home / Self Care | Payer: 59 | Source: Ambulatory Visit | Attending: Radiation Oncology | Admitting: Radiation Oncology

## 2015-08-02 VITALS — BP 119/62 | HR 78 | Temp 98.6°F | Ht 67.0 in | Wt 189.1 lb

## 2015-08-02 DIAGNOSIS — C50912 Malignant neoplasm of unspecified site of left female breast: Secondary | ICD-10-CM

## 2015-08-02 HISTORY — DX: Personal history of irradiation: Z92.3

## 2015-08-02 NOTE — Progress Notes (Signed)
CC: Dr. Gunnar Bulla Magrinat, Dr. 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  She is pleased with her cosmesis.  She saw Dr. Elisha Headland soon after her treatment and she will see her again in February at which time she is scheduled for mammography at [facility]. She will see Dr. Jana Hakim for a follow-up visit in October.  I understand that she is scheduled for a CT scan on October 5.  Physical examination: Alert and oriented. Filed Vitals:   08/02/15 1214  BP: 119/62  Pulse: 78  Temp: 98.6 F (37 C)   Nodes: There is no palpable cervical, supraclavicular, or axillary lymphadenopathy.  Breasts: There is residual hyperpigmentation the skin along the left breast.  There is patchy dry desquamation.  There is minimal thickening of left breast, no masses are appreciated.  Right breast without masses or lesions.  Extremities: Without edema.  Impression: Satisfactory progress.  She will have mammography in February, and maintain follow-up with Dr. Elisha Headland and Dr. Jana Hakim.

## 2015-08-02 NOTE — Progress Notes (Signed)
Robin Franklin has resolving radiation therapy hyperpigmentation in her tx field, left breast.  Skin intact.  Reports intermittent shooting pains in the left breast.

## 2015-08-19 ENCOUNTER — Telehealth: Payer: Self-pay | Admitting: *Deleted

## 2015-08-19 NOTE — Telephone Encounter (Signed)
Patient called and stated,"I am a patient of Dr. Virgie Dad but I had my port taken out at [facility] this pass Tuesday. Now, I'm having some fluttering in my chest." Patient denied CP, SOB, nausea, dizziness and lightheadedness. Informed patient that Dr. Doris Cheadle was out of the office today. Patient stated, "I've actually called [facility] I'm waiting on their return call." Instructed patient if she had not heard from [facility], to monitor her symptoms, and if she developed any SOB, CP or pain to go to the ER to be evaluated. Patient verbalized understanding.

## 2015-08-24 ENCOUNTER — Encounter (HOSPITAL_COMMUNITY): Payer: Self-pay

## 2015-08-24 ENCOUNTER — Other Ambulatory Visit: Payer: Self-pay | Admitting: Nurse Practitioner

## 2015-08-24 ENCOUNTER — Telehealth: Payer: Self-pay | Admitting: *Deleted

## 2015-08-24 ENCOUNTER — Ambulatory Visit (HOSPITAL_COMMUNITY)
Admission: RE | Admit: 2015-08-24 | Discharge: 2015-08-24 | Disposition: A | Discharge status: Home / Self Care | Payer: 59 | Source: Ambulatory Visit | Attending: Oncology | Admitting: Oncology

## 2015-08-24 ENCOUNTER — Other Ambulatory Visit (HOSPITAL_BASED_OUTPATIENT_CLINIC_OR_DEPARTMENT_OTHER): Payer: 59

## 2015-08-24 DIAGNOSIS — C50912 Malignant neoplasm of unspecified site of left female breast: Secondary | ICD-10-CM

## 2015-08-24 DIAGNOSIS — E559 Vitamin D deficiency, unspecified: Secondary | ICD-10-CM

## 2015-08-24 DIAGNOSIS — C50612 Malignant neoplasm of axillary tail of left female breast: Secondary | ICD-10-CM

## 2015-08-24 DIAGNOSIS — R918 Other nonspecific abnormal finding of lung field: Secondary | ICD-10-CM | POA: Insufficient documentation

## 2015-08-24 LAB — COMPREHENSIVE METABOLIC PANEL (CC13)
ALBUMIN: 4.3 g/dL (ref 3.5–5.0)
ALK PHOS: 67 U/L (ref 40–150)
ALT: 14 U/L (ref 0–55)
ANION GAP: 7 meq/L (ref 3–11)
AST: 14 U/L (ref 5–34)
BUN: 11.7 mg/dL (ref 7.0–26.0)
CO2: 29 mEq/L (ref 22–29)
Calcium: 10 mg/dL (ref 8.4–10.4)
Chloride: 109 mEq/L (ref 98–109)
Creatinine: 0.9 mg/dL (ref 0.6–1.1)
EGFR: 90 mL/min/{1.73_m2} — AB (ref >90)
GLUCOSE: 101 mg/dL (ref 70–140)
Potassium: 4.1 mEq/L (ref 3.5–5.1)
Sodium: 144 mEq/L (ref 136–145)
TOTAL PROTEIN: 7.1 g/dL (ref 6.4–8.3)
Total Bilirubin: 0.49 mg/dL (ref 0.20–1.20)

## 2015-08-24 LAB — CBC WITH DIFFERENTIAL/PLATELET
BASO%: 0.9 % (ref 0.0–2.0)
Basophils Absolute: 0 10*3/uL (ref 0.0–0.1)
EOS ABS: 0.1 10*3/uL (ref 0.0–0.5)
EOS%: 2.3 % (ref 0.0–7.0)
HCT: 40.8 % (ref 34.8–46.6)
HEMOGLOBIN: 13.6 g/dL (ref 11.6–15.9)
LYMPH#: 1.1 10*3/uL (ref 0.9–3.3)
LYMPH%: 29.5 % (ref 14.0–49.7)
MCH: 26.8 pg (ref 25.1–34.0)
MCHC: 33.2 g/dL (ref 31.5–36.0)
MCV: 80.8 fL (ref 79.5–101.0)
MONO#: 0.5 10*3/uL (ref 0.1–0.9)
MONO%: 12.3 % (ref 0.0–14.0)
NEUT#: 2.1 10*3/uL (ref 1.5–6.5)
NEUT%: 55 % (ref 38.4–76.8)
Platelets: 221 10*3/uL (ref 145–400)
RBC: 5.05 10*6/uL (ref 3.70–5.45)
RDW: 13.9 % (ref 11.2–14.5)
WBC: 3.8 10*3/uL — ABNORMAL LOW (ref 3.9–10.3)

## 2015-08-24 MED ORDER — IOHEXOL 300 MG/ML  SOLN
75.0000 mL | Freq: Once | INTRAMUSCULAR | Status: AC | PRN
  Administered 2015-08-24: 75 mL via INTRAVENOUS

## 2015-08-24 NOTE — Telephone Encounter (Signed)
Spoke to pt about appt with Survivorship so close to follow up with Dr. Jana Hakim along with scans completed today. Pt would like to reschedule survivorship appt 10/6 to a future date after her appt with Dr. Jana Hakim on 10/12. Pt has many questions about taking supplements. Offered to refer pt to nutritionist. Pt would like to wait and discuss this with Dr. Jana Hakim first. Pt has started taking an increased dose of vitamin C in fear of becoming ill this flu season. Advised pt to practice good hand hygiene and get her flu shot as soon as she is able. Pt agrees. Pt understands to call with any concerns in the interm.

## 2015-08-25 ENCOUNTER — Encounter: Payer: 59 | Admitting: Nurse Practitioner

## 2015-08-25 LAB — VITAMIN D 25 HYDROXY (VIT D DEFICIENCY, FRACTURES): Vit D, 25-Hydroxy: 27 ng/mL — ABNORMAL LOW (ref 30–100)

## 2015-08-31 ENCOUNTER — Ambulatory Visit (HOSPITAL_BASED_OUTPATIENT_CLINIC_OR_DEPARTMENT_OTHER): Payer: 59 | Admitting: Oncology

## 2015-08-31 ENCOUNTER — Telehealth: Payer: Self-pay | Admitting: Oncology

## 2015-08-31 VITALS — BP 118/81 | HR 65 | Temp 98.0°F | Resp 18 | Ht 67.0 in | Wt 192.1 lb

## 2015-08-31 DIAGNOSIS — G620 Drug-induced polyneuropathy: Secondary | ICD-10-CM

## 2015-08-31 DIAGNOSIS — C50912 Malignant neoplasm of unspecified site of left female breast: Secondary | ICD-10-CM

## 2015-08-31 DIAGNOSIS — Z853 Personal history of malignant neoplasm of breast: Secondary | ICD-10-CM

## 2015-08-31 MED ORDER — LIDOCAINE 5 % EX PTCH: 1.0000 | MEDICATED_PATCH | CUTANEOUS | Status: DC

## 2015-08-31 NOTE — Progress Notes (Signed)
Robin Franklin  [phone] [phone]    ID: Robin Franklin DOB: Jan 24, 1961  MR#: [id number]  YKZ#:[id number]  Patient Care Team: Redmond School, MD as PCP - General (Internal Medicine) Stark Klein, MD as Consulting Physician (General Surgery) Chauncey Cruel, MD as Consulting Physician (Oncology) Arloa Koh, MD as Consulting Physician (Radiation Oncology) Vania Rea, MD as Consulting Physician (Obstetrics and Gynecology) Newman Pies, MD as Consulting Physician (Neurosurgery) Angelina Ok, MD as Referring Physician (Surgery) OTHER MD:  CHIEF COMPLAINT: Triple negative breast cancer   CURRENT TREATMENT: Observation  BREAST CANCER HISTORY: From the original intake note:  Robin Franklin herself noted a mass in her left axilla sometime in late November or she brought it to Dr. Nolon Rod attention and on 11/15/2014 she had a left mammogram and left ultrasound at the breast Center. The breast density was category 8. The left breast was entirely unremarkable, with no masses, distortion, or calcifications. In the left axilla there was a mass which was palpable and moderately firm. By ultrasound it measured 3.2 cm. There were no other axillary masses of concern.  Biopsy of the left axillary mass the same day showed ([id number]) a high-grade carcinoma which was estrogen and progesterone receptor negative, HER-2 negative, with a signals ratio of 1.53 and the number per cell of 2.60, with an MIB-1 of 95%. It was also gross cystic disease fluid protein negative, and negative for cytokeratin 20 and TTF-1. It was positive for cytokeratin 7. This was felt to be consistent with a breast primary, but gynecologic and other malignancies were also in the differential.  In general 04/08/2015 the patient had a diagnostic right mammogram, which was negative. On the same day she had bilateral breast MRI. There were no masses or abnormal enhancement in either breast. In the left  axilla there was a round enhancing mass measuring 4.6 cm. There were mildly prominent smaller immediately adjacent lymph nodes. He was no right axillary or internal mammary adenopathy. An incidental lipoma was noted in the left lateral chest wall.  Her subsequent history is as detailed below.  INTERVAL HISTORY:  Robin Franklin returns today for follow-up of her triple negative breast cancer. She completed her radiation treatments in August. She generally tolerated that well, although she did have some "raw skin", hyperpigmentation, and fatigue. She had her port removed recently. She just had a restaging chest CT scan which shows no evidence of residual or recurrent disease. It does show some lung changes related to her recent radiation and a little bit of air and fluid in the port pocket not suggestive of infection  REVIEW OF SYSTEMS: She continues to have disabling back pain which does not allow her to bend forward or to walk for any distance. She is going to a chiropractor every week and getting acupuncture as well. She is trying not to use pain medicine and is not on any narcotics at present. Aside from that she feels fatigued. She has occasional headaches, which are not more intense or persistent or frequent than before. She feels anxious but not depressed. She is having moderate hot flashes and night sweats. A detailed review of systems today was otherwise stable.  PAST MEDICAL HISTORY: Past Medical History  Diagnosis Date  . Hypothyroidism   . Headache(784.0)   . Anxiety   . Breast cancer ([place])   . Arthritis   . Joint pain   . S/P radiation therapy 05/17/2015 through 06/24/2015     Left breast 5040 cGy in 28 sessions;  left supraclavicular/axillary region 5040 cGy in 28 sessions     PAST SURGICAL HISTORY: Past Surgical History  Procedure Laterality Date  . Breast reduction surgery    . Cervical cone biopsy    . Cyst removed  from neck    . Colonoscopy  11/06/2011    Procedure: COLONOSCOPY;  Surgeon: Dorothyann Peng, MD;  Location: [facility];  Service: Endoscopy;  Laterality: N/A;  11:30 AM  . Steriod injection      FAMILY HISTORY Family History  Problem Relation Age of Onset  . Colon cancer Neg Hx   . Breast cancer Mother 87  the patient's father died from a myocardial infarction at the age of 110. [name] died from breast cancer at the age of 19. It was diagnosed at age 60. The patient has 2 brothers, one sister. There is no other history of breast or ovarian cancer in the family to her knowledge.  GYNECOLOGIC HISTORY:  Patient's last menstrual period was 07/20/2013 (approximate). Menarche age 40, first live birth age 62. The patient is GX P2. She used oral contraceptives for a few months remotely with no side effects. She stopped having periods in September 2014. 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 [name] is a Merchant navy officer with [employer]. At home it's just the 2 of them, with no pets. Jannell's children from an earlier marriage are Robin Franklin. Robin Franklin, who lives in [place] and is a Public relations account executive; and Robin Franklin, who lives in [place] and is a Biochemist, clinical. The patient has 4 grandchildren +2 by adoption. She attends a [employer] locally    ADVANCED DIRECTIVES: Not in place   HEALTH MAINTENANCE: Social History  Substance Use Topics  . Smoking status: Never Smoker   . Smokeless tobacco: Not on file  . Alcohol Use: No     Colonoscopy: 2013/ [facility]  PAP: February 2015  Bone density: Never  Lipid panel:  Allergies  Allergen Reactions  . Other Rash    Unknown Flu Vaccine from 2015 caused rash    Current Outpatient Prescriptions  Medication Sig Dispense Refill  . ALPRAZolam (XANAX) 0.5 MG tablet Take 0.5 mg by mouth daily as needed for anxiety. For anxiety    . Cyanocobalamin  (VITAMIN B 12 PO) Take 1 tablet by mouth daily.    . cyclobenzaprine (FLEXERIL) 10 MG tablet Take 10 mg by mouth as needed.   0  . gabapentin (NEURONTIN) 100 MG capsule TAKE 1 CAPSULE (100 MG TOTAL) BY MOUTH 2 (TWO) TIMES DAILY. 60 capsule 1  . HYDROcodone-acetaminophen (NORCO/VICODIN) 5-325 MG per tablet     . levothyroxine (SYNTHROID, LEVOTHROID) 75 MCG tablet Take 75 mcg by mouth daily before breakfast.    . lidocaine-prilocaine (EMLA) cream Apply 1 application topically as needed. 30 g 3  . Melatonin 3 MG TABS Take 3 mg by mouth at bedtime as needed (Sleep).    . Multiple Vitamin (MULTIVITAMIN WITH MINERALS) TABS tablet Take 1 tablet by mouth daily.    . naproxen sodium (ANAPROX) 220 MG tablet Take 220 mg by mouth 2 (two) times daily with a meal.    . naproxen sodium (ANAPROX) 220 MG tablet Take 220 mg by mouth 2 (two) times daily with a meal.    . Omega-3 Fatty Acids (FISH OIL) 1000 MG CAPS Take 1 capsule by mouth daily.    . Selenium 100 MCG CAPS Take  1 capsule by mouth daily.    Marland Kitchen UNABLE TO FIND Apply 1 application topically 4 (four) times daily. Keto%/Bac2%/Gab5%/Lido5%    . vitamin C (ASCORBIC ACID) 500 MG tablet Take 500 mg by mouth daily.     No current facility-administered medications for this visit.    OBJECTIVE: Middle-aged African-American woman  Filed Vitals:   08/31/15 0813  BP: 118/81  Pulse: 65  Temp: 98 F (36.7 C)  Resp: 18     Body mass index is 30.08 kg/(m^2).    ECOG FS:2 - Symptomatic, <50% confined to bed   Sclerae unicteric, pupils round and equal Oropharynx clear and moist-- no thrush or other lesions No cervical or supraclavicular adenopathy Lungs no rales or rhonchi Heart regular rate and rhythm Abd soft, nontender, positive bowel sounds MSK no upper extremity lymphedema Neuro: nonfocal, well oriented, appropriate affect Breasts: The right breast is unremarkable. The left breast is status post lumpectomy and radiation. There is some  hyperpigmentation, but mild. There is skin edema. The left breast is slightly larger than the right at present. There is no evidence of local recurrence. Overall the cosmetic result is excellent. The left axilla is benign Skin: The right upper anterior chest port site shows no fluctuance, erythema, or dehiscence.  LAB RESULTS:  CMP     Component Value Date/Time   NA 144 08/24/2015 0820   NA 142 01/13/2014 1928   K 4.1 08/24/2015 0820   K 3.5* 01/13/2014 1928   CL 103 01/13/2014 1928   CO2 29 08/24/2015 0820   CO2 25 12/23/2010 1229   GLUCOSE 101 08/24/2015 0820   GLUCOSE 82 01/13/2014 1928   BUN 11.7 08/24/2015 0820   BUN 14 01/13/2014 1928   CREATININE 0.9 08/24/2015 0820   CREATININE 1.00 01/13/2014 1928   CALCIUM 10.0 08/24/2015 0820   CALCIUM 9.1 12/23/2010 1229   PROT 7.1 08/24/2015 0820   ALBUMIN 4.3 08/24/2015 0820   AST 14 08/24/2015 0820   ALT 14 08/24/2015 0820   ALKPHOS 67 08/24/2015 0820   BILITOT 0.49 08/24/2015 0820   GFRNONAA >60 12/23/2010 1229   GFRAA  12/23/2010 1229    >60        The eGFR has been calculated using the MDRD equation. This calculation has not been validated in all clinical situations. eGFR's persistently <60 mL/min signify possible Chronic Kidney Disease.    INo results found for: SPEP, UPEP  Lab Results  Component Value Date   WBC 3.8* 08/24/2015   NEUTROABS 2.1 08/24/2015   HGB 13.6 08/24/2015   HCT 40.8 08/24/2015   MCV 80.8 08/24/2015   PLT 221 08/24/2015      Chemistry      Component Value Date/Time   NA 144 08/24/2015 0820   NA 142 01/13/2014 1928   K 4.1 08/24/2015 0820   K 3.5* 01/13/2014 1928   CL 103 01/13/2014 1928   CO2 29 08/24/2015 0820   CO2 25 12/23/2010 1229   BUN 11.7 08/24/2015 0820   BUN 14 01/13/2014 1928   CREATININE 0.9 08/24/2015 0820   CREATININE 1.00 01/13/2014 1928      Component Value Date/Time   CALCIUM 10.0 08/24/2015 0820   CALCIUM 9.1 12/23/2010 1229   ALKPHOS 67 08/24/2015 0820     AST 14 08/24/2015 0820   ALT 14 08/24/2015 0820   BILITOT 0.49 08/24/2015 0820       No results found for: LABCA2  No components found for: LABCA125  No results for input(s):  INR in the last 168 hours.  Urinalysis    Component Value Date/Time   COLORURINE YELLOW 01/13/2014 1924   APPEARANCEUR CLEAR 01/13/2014 1924   LABSPEC >1.030* 01/13/2014 1924   PHURINE 5.5 01/13/2014 1924   GLUCOSEU NEGATIVE 01/13/2014 1924   HGBUR NEGATIVE 01/13/2014 1924   BILIRUBINUR NEGATIVE 01/13/2014 1924   KETONESUR NEGATIVE 01/13/2014 1924   PROTEINUR NEGATIVE 01/13/2014 1924   UROBILINOGEN 0.2 01/13/2014 1924   NITRITE NEGATIVE 01/13/2014 1924   LEUKOCYTESUR NEGATIVE 01/13/2014 1924    STUDIES: Ct Chest W Contrast  08/24/2015  CLINICAL DATA:  Subsequent encounter for left breast cancer. Status post Port-A-Cath removal last week. EXAM: CT CHEST WITH CONTRAST TECHNIQUE: Multidetector CT imaging of the chest was performed during intravenous contrast administration. CONTRAST:  5m OMNIPAQUE IOHEXOL 300 MG/ML  SOLN COMPARISON:  12/02/2014. FINDINGS: Mediastinum / Lymph Nodes: A small collection of fluid and gas in the subcutaneous fat of the upper right anterior chest wall in is compatible with the site of previous Port-A-Cath device and a small volume of gas would not be unexpected within 7-10 days after catheter removal. No right axillary lymphadenopathy. The large left axillary lymph nodes seen previously is no longer evident and stellate soft tissue attenuation in the left axilla is compatible with scar from prior adenectomy. Skin thickening in the left breast is not substantially changed in the interval. No mediastinal lymphadenopathy. There is no hilar lymphadenopathy. The esophagus has normal CT imaging features. Lungs / Pleura: Right lung is clear without an pulmonary nodule or mass. Interstitial and patchy alveolar opacity in the anterior left upper lobe, mainly in an anterior subpleural location  suggests sequelae of radiation therapy. No pulmonary edema or pleural effusion. MSK / Soft Tissues: Bone windows reveal no worrisome lytic or sclerotic osseous lesions. Upper Abdomen:  Unremarkable. IMPRESSION: 1. Patchy interstitial and alveolar opacity in the anterior left lung suggests radiation change given the distribution. 2. Small collection of gas and fluid in the pocket of previous port device, not unexpected within 7-10 days after port removal. 3. No evidence for recurrent or metastatic disease in the thorax. Electronically Signed   By: EMisty StanleyM.D.   On: 08/24/2015 10:12    ASSESSMENT: 54y.o. [place] woman status post biopsy 11/15/2014 of a 4.6 cm left axillary mass showing a poorly differentiated carcinoma, triple negative, with an MIB-1 of 90%.  (1) s/p left axillary lymph node dissection under Dr.Howard-McNatt 12/23/2014 with 1 of 14 lymph nodes positive; TX N1, stage II; repeat prognostic panel again triple negative  (2) adjuvant chemotherapy started 01/17/2015, consisting of cyclophosphamide and doxorubicin 4 given in dose dense fashion, completed 02/28/2015, followed by paclitaxel and carboplatin given weekly with 12 cycles planned, but stopped after only 3 cycles because of progressive neuropathy, last dose 03/28/2015  (3) radiation 05/17/2015 through 06/24/2015:Left breast 5040 cGy in 28 sessions; left supraclavicular/axillary region 5040 cGy in 28 sessions  (4) genetics counseling canceled by patient, to be rescheduled at patient's discretion  PLAN: Fanta is done with her active treatment for breast cancer. 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 The latter is asymptomatic and requires no intervention. As far as the neuropathy is concerned, it bothers her most at night. I suggested she take 200 mg of gabapentin at bedtime and see if that takes care of the  problem.  The big issue facing her of course is her disabling back problems. Today I  wrote her for Lidoderm to see that helps her in terms of pain control. She will be meeting with Dr. Arnoldo Morale regarding further evaluation and possible surgery.  Because she is still high risk I am setting her up for bilateral mammography with tomography and bilateral breast MRIs late December. She will return to see me early January. Before was negative, we will coordinate with Dr. Ernestina Patches office so she will be seen every 3 months by one of [place] alternating for the next 2 years, after which we will broaden the follow-up interval  I discussed the genetics issue again with her and she is now amenable to being tested. We will set her up with one of our counselors in the near future.  Shawonda has a good understanding of this plan. She agrees with it. She knows the goal of treatment in her case is cure. She will call with any problems that may develop before her next visit here.   Chauncey Cruel, MD   08/31/2015 8:36 AM  Medical Oncology and Hematology [facility] [address] [place], [place] [address] Tel. [phone]    Fax. [phone]

## 2015-08-31 NOTE — Telephone Encounter (Signed)
Spoke with patient and she is aware of her appointments and will call [facility] for mammo and mri

## 2015-09-01 ENCOUNTER — Telehealth: Payer: Self-pay | Admitting: *Deleted

## 2015-09-01 DIAGNOSIS — C50912 Malignant neoplasm of unspecified site of left female breast: Secondary | ICD-10-CM

## 2015-09-01 NOTE — Telephone Encounter (Signed)
Called pt to assess needs. Relate doing well and without complaints. Discussed survivorship program and referral.  Encourage pt to call with needs. Received verbal understanding.

## 2015-09-02 ENCOUNTER — Encounter: Payer: Self-pay | Admitting: Oncology

## 2015-09-02 ENCOUNTER — Telehealth: Payer: Self-pay | Admitting: Oncology

## 2015-09-02 NOTE — Progress Notes (Signed)
Per [employer] approved for lidocaine 5% patch...Marland KitchenMarland KitchenMarland Kitchenprior auth

## 2015-09-02 NOTE — Telephone Encounter (Signed)
s.w. pt and advised on NOV appt....pt ok and aware °

## 2015-09-14 ENCOUNTER — Encounter: Payer: Self-pay | Admitting: Oncology

## 2015-09-14 NOTE — Progress Notes (Signed)
I placed [employer] form on desk of nurse for dr. Jana Hakim

## 2015-09-16 ENCOUNTER — Telehealth: Payer: Self-pay

## 2015-09-16 NOTE — Telephone Encounter (Signed)
Pt called stating [employer] sent her a letter requesting tx notes and other documentation from June 1st to the present. S/w Gevena Barre RN and the form is on the nurses desk. Kay sent it back to Raquel for the notes etc that were requested along with the form. It is a disability verification form.

## 2015-09-20 ENCOUNTER — Other Ambulatory Visit: Payer: Self-pay | Admitting: Oncology

## 2015-09-20 ENCOUNTER — Encounter: Payer: Self-pay | Admitting: Oncology

## 2015-09-20 NOTE — Progress Notes (Signed)
I faxed [employer] forms/notes/labs to  [phone]

## 2015-09-21 ENCOUNTER — Encounter: Payer: Self-pay | Admitting: Genetic Counselor

## 2015-09-21 ENCOUNTER — Other Ambulatory Visit (HOSPITAL_BASED_OUTPATIENT_CLINIC_OR_DEPARTMENT_OTHER): Payer: 59

## 2015-09-21 ENCOUNTER — Ambulatory Visit (HOSPITAL_BASED_OUTPATIENT_CLINIC_OR_DEPARTMENT_OTHER): Payer: 59 | Admitting: Genetic Counselor

## 2015-09-21 DIAGNOSIS — C50912 Malignant neoplasm of unspecified site of left female breast: Secondary | ICD-10-CM | POA: Diagnosis not present

## 2015-09-21 DIAGNOSIS — Z803 Family history of malignant neoplasm of breast: Secondary | ICD-10-CM | POA: Insufficient documentation

## 2015-09-21 DIAGNOSIS — Z315 Encounter for genetic counseling: Secondary | ICD-10-CM | POA: Diagnosis not present

## 2015-09-21 LAB — CBC WITH DIFFERENTIAL/PLATELET
BASO%: 0 % (ref 0.0–2.0)
Basophils Absolute: 0 10*3/uL (ref 0.0–0.1)
EOS%: 0 % (ref 0.0–7.0)
Eosinophils Absolute: 0 10*3/uL (ref 0.0–0.5)
HEMATOCRIT: 40.3 % (ref 34.8–46.6)
HEMOGLOBIN: 13.7 g/dL (ref 11.6–15.9)
LYMPH%: 7.3 % — ABNORMAL LOW (ref 14.0–49.7)
MCH: 27.6 pg (ref 25.1–34.0)
MCHC: 34 g/dL (ref 31.5–36.0)
MCV: 81.1 fL (ref 79.5–101.0)
MONO#: 0.7 10*3/uL (ref 0.1–0.9)
MONO%: 5.3 % (ref 0.0–14.0)
NEUT#: 11.6 10*3/uL — ABNORMAL HIGH (ref 1.5–6.5)
NEUT%: 87.4 % — ABNORMAL HIGH (ref 38.4–76.8)
Platelets: 242 10*3/uL (ref 145–400)
RBC: 4.97 10*6/uL (ref 3.70–5.45)
RDW: 13.8 % (ref 11.2–14.5)
WBC: 13.3 10*3/uL — ABNORMAL HIGH (ref 3.9–10.3)
lymph#: 1 10*3/uL (ref 0.9–3.3)

## 2015-09-21 LAB — COMPREHENSIVE METABOLIC PANEL (CC13)
ALT: 14 U/L (ref 0–55)
AST: 13 U/L (ref 5–34)
Albumin: 4.5 g/dL (ref 3.5–5.0)
Alkaline Phosphatase: 72 U/L (ref 40–150)
Anion Gap: 8 mEq/L (ref 3–11)
BUN: 13.1 mg/dL (ref 7.0–26.0)
CO2: 26 mEq/L (ref 22–29)
Calcium: 10.3 mg/dL (ref 8.4–10.4)
Chloride: 108 mEq/L (ref 98–109)
Creatinine: 0.8 mg/dL (ref 0.6–1.1)
EGFR: >90 mL/min/{1.73_m2} (ref >90)
Glucose: 112 mg/dl (ref 70–140)
POTASSIUM: 4.1 meq/L (ref 3.5–5.1)
Sodium: 142 mEq/L (ref 136–145)
Total Bilirubin: 0.32 mg/dL (ref 0.20–1.20)
Total Protein: 7.7 g/dL (ref 6.4–8.3)

## 2015-09-21 NOTE — Progress Notes (Signed)
REFERRING PROVIDER: Redmond School, MD [place], [place] [address]   Robin Del, MD  PRIMARY PROVIDER:  Glo Herring., MD  PRIMARY REASON FOR VISIT:  1. Malignant neoplasm of left female breast, unspecified site of breast ([place])   2. Family history of breast cancer      HISTORY OF PRESENT ILLNESS:   Robin Franklin, a 54 y.o. female, was seen for a [facility] cancer genetics consultation at the request of Dr. Jana Hakim due to a personal and family history of breast cancer.  Robin Franklin presents to clinic today to discuss the possibility of a hereditary predisposition to cancer, genetic testing, and to further clarify her future cancer risks, as well as potential cancer risks for family members.   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 This was treated with lumpectomy, chemotherapy and radiation.  A recent CT scan was clear.     CANCER HISTORY:    Axillary mass- Left   11/15/2014 Receptors her2 Estrogen Receptor: 0%, NEGATIVE Progesterone Receptor: 0%, NEGATIVE Proliferation Marker Ki67: 95%   HER-2/NEU BY CISH - NO AMPLIFICATION OF HER-2 DETECTED. RESULT RATIO OF HER2: CEP 17 SIGNALS 1.53 AVERAGE HER2 COPY NUMBER PER CELL 2.60   11/15/2014 Pathology Results METASTATIC CARCINOMA IN 1 OF 1 LYMPH NODE (1/1).   11/15/2014 Breast US Left axillary mass, most likely an enlarged, abnormal lymph node. A  primary breast malignancy is possible. Biopsy is indicated.   11/23/2014 Breast MRI 1. 4.6 cm enhancing mass within the left axilla consistent with the  known carcinoma within the left axilla (recently biopsied under  ultrasound guidance). No findings within either breast to suggest an  underlying primary tumor.     2. Incident   11/24/2014 Initial Diagnosis Axillary mass- Left    Breast cancer, left breast ([place])   11/15/2014 Mammogram Left breast: negative for mass   11/15/2014  Breast US Left axilla: oval, hypoechoic, solid, circumscribed mass measuring 3.2 cm x 2.6 cm x 3.2 cm. There is mild internal blood flow on color Doppler imaging. There is an adjacent normal sized lymph node with a normal cortical thickness    11/15/2014 Initial Biopsy Left axillary lymph node core bx: metastatic carcinoma, ER- (0%), PR- (0%), HER2/neu negative (ratio 1.53), Ki67 95%.   11/23/2014 Mammogram Right breast: negative for mass   11/23/2014 Breast MRI 4.6 cm enhancing mass within the left axilla consistent with the known carcinoma within the left axilla (recently biopsied under ultrasound guidance). No findings within either breast to suggest an underlying primary tumor.   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 Total dose: 50.4 Gy    Procedure Genetic counseling appointment 11/2     HORMONAL RISK FACTORS:  Menarche was at age 52.  First live birth at age 15.  OCP use for approximately 0 years.  Ovaries intact: yes.  Hysterectomy: no.  Menopausal status: postmenopausal.  HRT use: 0 years. Colonoscopy: yes;  normal. Mammogram within the last year: yes. Number of breast biopsies: 1. Up to date with pelvic exams:  yes. Any excessive radiation exposure in the past:  For breast cancer treatment  Past Medical History  Diagnosis Date  . Hypothyroidism   . Headache(784.0)   . Anxiety   . Breast cancer ([place])   . Arthritis   . Joint pain   . S/P  radiation therapy 05/17/2015 through 06/24/2015     Left breast 5040 cGy in 28 sessions; left supraclavicular/axillary region 5040 cGy in 28 sessions     Past Surgical History  Procedure Laterality Date  . Breast reduction surgery    . Cervical cone biopsy    . Cyst removed from neck    . Colonoscopy  11/06/2011    Procedure: COLONOSCOPY;  Surgeon: Dorothyann Peng, MD;  Location: [facility];  Service: Endoscopy;  Laterality: N/A;  11:30 AM  . Steriod injection      Social History   Social History  . Marital Status: Married    Spouse Name: N/A  . Number of Children: N/A  . Years of Education: N/A   Social History Main Topics  . Smoking status: Never Smoker   . Smokeless tobacco: Not on file  . Alcohol Use: No  . Drug Use: No  . Sexual Activity: Not on file   Other Topics Concern  . Not on file   Social History Narrative     FAMILY HISTORY:  We obtained a detailed, 4-generation family history.  Significant diagnoses are listed below: Family History  Problem Relation Age of Onset  . Colon cancer Neg Hx   . Breast cancer Mother 35   The patient has two sons who are cancer free.  Her sister recently had a colonoscopy that found one polyp. Robin Franklin has two brothers who are cancer free. Her father died at 50 from a heart attack.  Her mother was diagnosed with breast cancer at 54 and died at 81.  Her mother had three brothers and two sisters.  One brother possibly had prostate cancer and another brother died of some form of cancer.  Her father was one of 13 children.  Most people had heart attacks, but one paternal cousin was diagnosed with breast cancer in her 23s.  Patient's maternal ancestors are of [place] American and possibly Caucasian descent, and paternal ancestors are of African American descent. There is no reported Ashkenazi Jewish ancestry. There is no known consanguinity.  GENETIC COUNSELING  ASSESSMENT: Robin Franklin is a 54 y.o. female with a personal history of [place] breast cancer and family history of breast cancer which somewhat suggestive of a hereditary cancer syndrome and predisposition to cancer. We, therefore, discussed and recommended the following at today's visit.   DISCUSSION: We discussed that the genes most commonly associated with TN breast cancer include BRCA1, BRCA2 and PALB2.  HOwever we have seen [place] breast cancer associated with other genes as well.  We reviewed the characteristics, features and inheritance patterns of hereditary cancer syndromes. We also discussed genetic testing, including the appropriate family members to test, the process of testing, insurance coverage and turn-around-time for results. We discussed the implications of a negative, positive and/or variant of uncertain significant result. We recommended Robin Franklin pursue genetic testing for the Breast/Ovarian cancer gene panel. The Breast/Ovarian gene panel offered by [facility] includes sequencing and rearrangement analysis for the following 20 genes:  ATM, BARD1, BRCA1, BRCA2, BRIP1, CDH1, CHEK2, EPCAM, FANCC, MLH1, MSH2, MSH6,  NBN, PALB2, PMS2, PTEN, RAD51C, RAD51D, TP53, and XRCC2.     Based on Ms. Laday's personal and family history of cancer, she meets medical criteria for genetic testing. Despite that she meets criteria, she may still have an out of pocket cost. We discussed that if her out of pocket cost for testing is over $100, the laboratory will call and confirm whether she wants to proceed with testing.  If the out of pocket cost of testing is less than $100 she will be billed by the genetic testing laboratory.   PLAN: After considering the risks, benefits, and limitations, Robin Franklin  provided informed consent to pursue genetic testing and the blood sample was sent to [facility] for analysis of the Breast/Ovarian cancer panel. Results should be available within approximately 2-3 weeks' time, at  which point they will be disclosed by telephone to Robin Franklin, as will any additional recommendations warranted by these results. Robin Franklin will receive a summary of her genetic counseling visit and a copy of her results once available. This information will also be available in Epic. We encouraged Robin Franklin to remain in contact with cancer genetics annually so that we can continuously update the family history and inform her of any changes in cancer genetics and testing that may be of benefit for her family. Robin Franklin questions were answered to her satisfaction today. Our contact information was provided should additional questions or concerns arise.  Lastly, we encouraged Robin Franklin to remain in contact with cancer genetics annually so that we can continuously update the family history and inform her of any changes in cancer genetics and testing that may be of benefit for this family.   Ms.  Franklin questions were answered to her satisfaction today. Our contact information was provided should additional questions or concerns arise. Thank you for the referral and allowing [place] to share in the care of your patient.   Karen P. Florene Glen, [place], [facility] Certified Genetic Counselor [email or web address] phone: [phone]  The patient was seen for a total of 45 minutes in face-to-face genetic counseling.  This patient was discussed with Drs. Magrinat, Lindi Adie and/or Burr Medico who agrees with the above.    _______________________________________________________________________ For Office Staff:  Number of people involved in session: 1 Was an Intern/ student involved with case: no

## 2015-09-23 ENCOUNTER — Encounter: Payer: Self-pay | Admitting: Nurse Practitioner

## 2015-09-23 ENCOUNTER — Ambulatory Visit (HOSPITAL_BASED_OUTPATIENT_CLINIC_OR_DEPARTMENT_OTHER): Payer: 59 | Admitting: Nurse Practitioner

## 2015-09-23 VITALS — BP 116/70 | HR 58 | Temp 98.5°F | Resp 19 | Ht 67.0 in | Wt 185.7 lb

## 2015-09-23 DIAGNOSIS — R2232 Localized swelling, mass and lump, left upper limb: Secondary | ICD-10-CM

## 2015-09-23 DIAGNOSIS — Z853 Personal history of malignant neoplasm of breast: Secondary | ICD-10-CM | POA: Diagnosis not present

## 2015-09-23 DIAGNOSIS — G629 Polyneuropathy, unspecified: Secondary | ICD-10-CM | POA: Diagnosis not present

## 2015-09-23 DIAGNOSIS — C50912 Malignant neoplasm of unspecified site of left female breast: Secondary | ICD-10-CM

## 2015-09-23 MED ORDER — MEDICAL COMPRESSION STOCKINGS MISC: 1.0000 | Freq: Every day | Status: DC

## 2015-09-23 NOTE — Progress Notes (Signed)
CLINIC:  Cancer Survivorship   REASON FOR VISIT:  Routine follow-up post-treatment for a recent history of breast cancer.  BRIEF ONCOLOGIC HISTORY:    Axillary mass- Left   11/15/2014 Receptors her2 Estrogen Receptor: 0%, NEGATIVE Progesterone Receptor: 0%, NEGATIVE Proliferation Marker Ki67: 95%   HER-2/NEU BY CISH - NO AMPLIFICATION OF HER-2 DETECTED. RESULT RATIO OF HER2: CEP 17 SIGNALS 1.53 AVERAGE HER2 COPY NUMBER PER CELL 2.60   11/15/2014 Pathology Results METASTATIC CARCINOMA IN 1 OF 1 LYMPH NODE (1/1).   11/15/2014 Breast US Left axillary mass, most likely an enlarged, abnormal lymph node. A  primary breast malignancy is possible. Biopsy is indicated.   11/23/2014 Breast MRI 1. 4.6 cm enhancing mass within the left axilla consistent with the  known carcinoma within the left axilla (recently biopsied under  ultrasound guidance). No findings within either breast to suggest an  underlying primary tumor.     2. Incident   11/24/2014 Initial Diagnosis Axillary mass- Left    Breast cancer, left breast ([place])   11/15/2014 Mammogram Left breast: negative for mass   11/15/2014 Breast US Left axilla: oval, hypoechoic, solid, circumscribed mass measuring 3.2 cm x 2.6 cm x 3.2 cm. There is mild internal blood flow on color Doppler imaging. There is an adjacent normal sized lymph node with a normal cortical thickness    11/15/2014 Initial Biopsy Left axillary lymph node core bx: metastatic carcinoma, ER- (0%), PR- (0%), HER2/neu negative (ratio 1.53), Ki67 95%.   11/23/2014 Mammogram Right breast: negative for mass   11/23/2014 Breast MRI 4.6 cm enhancing mass within the left axilla consistent with the known carcinoma within the left axilla (recently biopsied under ultrasound guidance). No findings within either breast to suggest an underlying primary tumor.   11/24/2014 Clinical Stage Stage II: [place] N1   12/23/2014 Definitive Surgery Left axillary dissection ([facility]): Metastatic carcinoma in  1/14 LN with extracapsular invasion,    12/23/2014 Pathologic Stage Stage II: [place] N1   01/17/2015 - 03/28/2015 Chemotherapy Adjuvant doxorubicin and cyclophosphamide x 4 cycles, followed by carboplatin and paclitaxel x 3 (of 12) cycles, stopped due to progressive neuropathy   05/17/2015 - 06/24/2015 Radiation Therapy Adjuvant RT Valere Dross): Left breast 50.4 Gy over 28 fractions; left supraclavicular/axillary region 50.4 Gy over 28 fractions. Total dose: 50.4 Gy   09/21/2015 Procedure Genetic testing: Breast/Ovarian ([facility]) panel performed examining ATM, BARD1, BRCA1, BRCA2, BRIP1, CDH1, CHEK2, EPCAM, FANCC, MLH1, MSH2, MSH6, NBN, PALB2, PMS2, PTEN, RAD51C, RAD51D, TP53, and XRCC2.  RESULTS PENDING   09/23/2015 Survivorship Survivorship care plan completed and given to patient.    INTERVAL HISTORY:  Ms. Schroepfer presents to the [facility] today for our initial meeting to review her survivorship care plan detailing her treatment course for breast cancer, as well as monitoring long-term side effects of that treatment, education regarding health maintenance, screening, and overall wellness and health promotion.     Overall, Ms. Perella reports feeling quite well since completing her radiation therapy approximately two months ago.  She has some continued darkening over her left breast, supraclavicular, and axillary areas and remains fatigued. She has occasional shooting pain along her left axilla that is self limiting.  She has a history of back pain and has been undergoing evaluation and treatment by Dr. Arnoldo Morale.  She recently underwent an epidural injection which has provided some pain relief.  She continues with peripheral neuropathy, but feels that it may be a little bit better, stating that she does not take her gabapentin  regularly.  She has had surgery in the past and he is uncertain whether additional surgery will be advantageous per Ms. Gaynor's report.  She does have some night sweats, which predated  her cancer diagnosis. Ms. Spoonemore reports the recent development (last 2-3 days) of some difficulty swallowing.  She states that she feels like there is a thickening in the back of her throat and has to work to get "things down."  She denies any pain, fever, or chills.  Ms. Boys has had no cough and headache.  She has been trying to lose some of the weight she has gained and reports a good appetite. She used to regularly exercise but has been too tired to do so since treatment.   REVIEW OF SYSTEMS:  General: As above. HEENT: Wears glasses for reading.  Mild difficulty swallowing as above.  Some visual changes post chemotherapy.  Denies hearing loss or mouth sores. Cardiac: Denies palpitations, chest pain, and lower extremity edema.  Respiratory: Denies dyspnea on exertion.  GI: Occasional epigastric burning.  Otherwise denies abdominal pain, constipation, diarrhea, nausea, or vomiting.  GU: Denies dysuria, hematuria, vaginal bleeding, vaginal discharge, or vaginal dryness.  Musculoskeletal: Back pain as above.  Otherwise, denies joint or bone pain.  Neuro: Improving peripheral neuropathy as above. Denies recent falls. Skin: Denies rash, pruritis, or open wounds.  Breast: Denies any new nodularity, masses, tenderness, nipple changes, or nipple discharge.  Psych: Some difficulty sleeping. Denies depression, anxiety, or memory loss.   A 14-point review of systems was completed and was negative, except as noted above.   ONCOLOGY TREATMENT TEAM:  1. Surgeon:  Dr. Elisha Headland at [facility]  2. Medical Oncologist: Dr. Jana Hakim 3. Radiation Oncologist: Dr. Valere Dross    PAST MEDICAL/SURGICAL HISTORY:  Past Medical History  Diagnosis Date  . Hypothyroidism   . Headache(784.0)   . Anxiety   . Breast cancer ([place])   . Arthritis   . Joint pain   . S/P radiation therapy 05/17/2015 through 06/24/2015     Left breast 5040 cGy in 28 sessions; left  supraclavicular/axillary region 5040 cGy in 28 sessions   . Family history of breast cancer    Past Surgical History  Procedure Laterality Date  . Breast reduction surgery    . Cervical cone biopsy    . Cyst removed from neck    . Colonoscopy  11/06/2011    Procedure: COLONOSCOPY;  Surgeon: Dorothyann Peng, MD;  Location: [facility];  Service: Endoscopy;  Laterality: N/A;  11:30 AM  . Steriod injection       ALLERGIES:  Allergies  Allergen Reactions  . Other Rash    Unknown Flu Vaccine from 2015 caused rash     CURRENT MEDICATIONS:  Current Outpatient Prescriptions on File Prior to Visit  Medication Sig Dispense Refill  . ALPRAZolam (XANAX) 0.5 MG tablet Take 0.5 mg by mouth daily as needed for anxiety. For anxiety    . Cyanocobalamin (VITAMIN B 12 PO) Take 1 tablet by mouth daily.    Marland Kitchen gabapentin (NEURONTIN) 100 MG capsule TAKE 1 CAPSULE (100 MG TOTAL) BY MOUTH 2 (TWO) TIMES DAILY. 60 capsule 1  . levothyroxine (SYNTHROID, LEVOTHROID) 75 MCG tablet Take 75 mcg by mouth daily before breakfast.    . lidocaine (LIDODERM) 5 % Place 1 patch onto the skin daily. Remove & Discard patch within 12 hours or as directed by MD 30 patch 0  . Melatonin 3 MG TABS Take 3 mg by mouth at bedtime as  needed (Sleep).    . Multiple Vitamin (MULTIVITAMIN WITH MINERALS) TABS tablet Take 1 tablet by mouth daily.    . Omega-3 Fatty Acids (FISH OIL) 1000 MG CAPS Take 1 capsule by mouth daily.    . Selenium 100 MCG CAPS Take 1 capsule by mouth daily.    . vitamin C (ASCORBIC ACID) 500 MG tablet Take 500 mg by mouth daily.    . naproxen sodium (ANAPROX) 220 MG tablet Take 220 mg by mouth 2 (two) times daily with a meal.     No current facility-administered medications on file prior to visit.     ONCOLOGIC FAMILY HISTORY:  Family History  Problem Relation Age of Onset  . Colon cancer Neg Hx   . Breast cancer Mother 81  . Heart attack Father   . Heart attack Maternal Uncle     . Cancer Maternal Uncle     NOS  . Prostate cancer Maternal Uncle   . Breast cancer Cousin     dx in her 32s     GENETIC COUNSELING/TESTING: Performed 09/21/2015 with results pending at the time of this visit.  SOCIAL HISTORY:  Agape FARDOWSA AUTHIER is married and lives with her family in [place], [place].  She has 2 children.  Ms. 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  She very rarely uses alcohol.   PHYSICAL EXAMINATION:  Vital Signs:   Filed Vitals:   09/23/15 0911  BP: 116/70  Pulse: 58  Temp: 98.5 F (36.9 C)  Resp: 19   General: Well-nourished, well-appearing female in no acute distress.  She is unaccompanied in clinic today.   HEENT: Hair is regrowing. Head is atraumatic and normocephalic.  Pupils equal and reactive to light and accomodation. Conjunctivae clear without exudate.  Sclerae anicteric. Oral mucosa is pink, moist, and intact without lesions.  Oropharynx is pink without lesions or erythema. Slight cobblestoning along posterior pharynx.  No thyromegaly. Lymph: No cervical, supraclavicular, infraclavicular, or axillary lymphadenopathy noted on palpation.  Cardiovascular: Regular rate and rhythm without murmurs, rubs, or gallops. Respiratory: Clear to auscultation bilaterally. Chest expansion symmetric without accessory muscle use on inspiration or expiration.  GI: Abdomen soft and round. No tenderness to palpation. Bowel sounds normoactive in 4 quadrants.   GU: Deferred.  Neuro: No focal deficits. Steady gait.  Psych: Mood and affect normal and appropriate for situation.  Extremities: No edema, cyanosis, or clubbing.  Skin: Warm and dry. No open lesions noted. Scar over right port-a-cath site intact without subcutaneous emphysema, redness or tenderness.  LABORATORY DATA:  None for this visit.  DIAGNOSTIC IMAGING:  CT of the chest performed  08/24/2015 shows patchy interstitial and alveolar opacity in the anterior left lung suggestive of radiation changes.  Small collection of gas and fluid in the pocket of the previous port device following removal of port-a-cath.  No evidence of recurrent or metastatic disease within the chest.    ASSESSMENT AND PLAN:   1. History of breast cancer: TXN1 Stage II metastatic carcinoma of the left axilla, with pathologic staining most consistent with breast primary, S/P axillary dissection with 1/14 LN positive for malignancy with extracapsular invasion, S/P adjuvant doxorubicin and cyclophosphamide x 4 cycles, followed by carboplatin and paclitaxel x 3 (of 12) cycles, stopped due to progressive neuropathy, followed by adjuvant RT to the left breast, left supraclavicular and axillary regions, with pending genetic testing results, now  followed in a program of surveillance. Ms. Fulgham is doing well without clinical evidence worrisome for disease progression at this time.  She will follow-up with her medical oncologist,  Dr. Jana Hakim, in January 2017 with history and physical exam per surveillance protocol. She will obtain breast MRI and mammograms in December 2016 prior to her appointment. A comprehensive survivorship care plan and treatment summary was reviewed with the patient today detailing her breast cancer diagnosis, treatment course, potential late/long-term effects of treatment, appropriate follow-up care with recommendations for the future, and patient education resources.  A copy of this summary, along with a letter will be sent to the patient's primary care provider via in basket message after today's visit. I will write Ms. Owens Shark a prescription to be fitted for a compression sleeve to help reduce her risk of lymphedema to be worn with exercise and with air travel. Ms. Westley is welcome to return to the Survivorship Clinic in the future, as needed; no follow-up will be scheduled at this time.    2.  Difficulty swallowing: Ms. Veldman symptoms have only been present for a few days and may be consistent with either seasonal allergies / post nasal drip or GERD.  She is seeing Dr. Gerarda Fraction at the end of the month and I have strongly encouraged her to discuss this further with him.  It would be unusual for this to be related to her cancer diagnosis, but if her symptoms continue, I would recommend further evaluation.  She is not having a lot of other symptoms consistent with seasonal allergies. We discussed a trial of OTC ranitidine to see if this improved her symptoms and again, I urged her to discuss this further with Dr. Gerarda Fraction at her upcoming appointment.  3. Cancer screening:  Due to Ms. Devonshire's history and her age, she should receive screening for skin cancers, colon cancer, and gynecologic cancers. She is up to date on her colonoscopy (2012).  I have encouraged her to reach out to her GYN to schedule her pelvic exam and PAP smear.  The information and recommendations are listed on the patient's comprehensive care plan/treatment summary and were reviewed in detail with the patient.    4. Health maintenance and wellness promotion: Ms. Abdelrahman was encouraged to consume 5-7 servings of fruits and vegetables per day. We reviewed the "Nutrition Rainbow" handout as well as discussing strategies to employ including reducing her intake of red meat and processed foods.  She is interested in losing some additional weight and we discussed limiting her intake of excessive sweets and carbohydrates.  She was also encouraged to engage in moderate to vigorous exercise for 30 minutes per day most days of the week. This will help with her fatigue as well as improve her endurance and reduce her recurrence risk. We discussed the [employer] fitness program, which is designed for cancer survivors to help them become more physically fit after cancer treatments.  She was instructed to continue to abstain from tobacco and alcohol  use.   5. Support services/counseling: It is not uncommon for this period of the patient's cancer care trajectory to be one of many emotions and stressors.  We discussed an opportunity for her to participate in the next session of [facility] ("Finding Your New Normal") support group series designed for patients after they have completed treatment.   Ms. Croak was encouraged to take advantage of our many other support services programs, support groups, and/or counseling in coping with her new life as a cancer  survivor after completing anti-cancer treatment.  She was offered support today through active listening and expressive supportive counseling.  She was given information regarding our available services and encouraged to contact me with any questions or for help enrolling in any of our support group/programs.    A total of 60 minutes of face-to-face time was spent with this patient with greater than 50% of that time in counseling and care-coordination.   Sylvan Cheese, NP  Survivorship Program [facility] [phone]   Note: PRIMARY CARE PROVIDER Glo Herring., MD [phone]

## 2015-10-12 ENCOUNTER — Telehealth: Payer: Self-pay | Admitting: Nurse Practitioner

## 2015-10-12 NOTE — Telephone Encounter (Signed)
Received voice mail from Ms. Robin Franklin requesting return call.  Had questions about compression sleeve and dietary modifications post cancer diagnosis.  Pt to call [employer] in [place] to see if they will be able to obtain her compression sleeve for her; if not, she will visit store here in [place].  Regarding patient's questions about dietary / nutrition changes, will mail her additional educational materials on healthy eating for breast cancer survivors.  Pt without additional questions at this time.

## 2015-10-24 ENCOUNTER — Ambulatory Visit
Admission: RE | Admit: 2015-10-24 | Discharge: 2015-10-24 | Disposition: A | Discharge status: Home / Self Care | Payer: 59 | Source: Ambulatory Visit | Attending: Oncology | Admitting: Oncology

## 2015-10-24 DIAGNOSIS — C50912 Malignant neoplasm of unspecified site of left female breast: Secondary | ICD-10-CM

## 2015-10-31 ENCOUNTER — Ambulatory Visit
Admission: RE | Admit: 2015-10-31 | Discharge: 2015-10-31 | Disposition: A | Discharge status: Home / Self Care | Payer: 59 | Source: Ambulatory Visit | Attending: Oncology | Admitting: Oncology

## 2015-10-31 DIAGNOSIS — C50912 Malignant neoplasm of unspecified site of left female breast: Secondary | ICD-10-CM

## 2015-10-31 MED ORDER — GADOBENATE DIMEGLUMINE 529 MG/ML IV SOLN
17.0000 mL | Freq: Once | INTRAVENOUS | Status: AC | PRN
  Administered 2015-10-31: 17 mL via INTRAVENOUS

## 2015-11-17 ENCOUNTER — Encounter: Payer: Self-pay | Admitting: Adult Health

## 2015-11-17 ENCOUNTER — Telehealth: Payer: Self-pay | Admitting: *Deleted

## 2015-11-17 MED ORDER — LIDOCAINE 5 % EX PTCH: 1.0000 | MEDICATED_PATCH | CUTANEOUS | Status: DC

## 2015-11-17 NOTE — Telephone Encounter (Signed)
Sent Rx for Lidoderm patches to Pharmacy to be refilled. I told pt this will done as a courtesy refill x once and moving fwd she will need to get Dr. Gerarda Fraction or Dr. Ace Gins to manage this for her . Pt verbalizes understanding. Pt will see Dr. Jana Hakim on 12/01/15.

## 2015-11-17 NOTE — Progress Notes (Signed)
A birthday card was mailed to the patient today on behalf of the Survivorship Program at [facility].   Jesson Foskey, NP Survivorship Program  Cancer Center [phone]  

## 2015-11-29 ENCOUNTER — Encounter: Payer: Self-pay | Admitting: Genetic Counselor

## 2015-11-29 ENCOUNTER — Telehealth: Payer: Self-pay | Admitting: Genetic Counselor

## 2015-11-29 DIAGNOSIS — Z1379 Encounter for other screening for genetic and chromosomal anomalies: Secondary | ICD-10-CM | POA: Insufficient documentation

## 2015-11-29 NOTE — Telephone Encounter (Signed)
Revealed negative genetic testing on the Breast/Ovarian cancer panel.  

## 2015-11-30 ENCOUNTER — Ambulatory Visit: Payer: Self-pay | Admitting: Genetic Counselor

## 2015-11-30 DIAGNOSIS — C50912 Malignant neoplasm of unspecified site of left female breast: Secondary | ICD-10-CM

## 2015-11-30 DIAGNOSIS — Z803 Family history of malignant neoplasm of breast: Secondary | ICD-10-CM

## 2015-11-30 NOTE — Progress Notes (Signed)
HPI: Ms. Tuohy was previously seen in the [facility] due to a personal and family history of cancer and concerns regarding a hereditary predisposition to cancer. Please refer to our prior cancer genetics clinic note for more information regarding Ms. Slatten's medical, social and family histories, and our assessment and recommendations, at the time. Ms. Manfredonia recent genetic test results were disclosed to her, as were recommendations warranted by these results. These results and recommendations are discussed in more detail below.  FAMILY HISTORY:  We obtained a detailed, 4-generation family history.  Significant diagnoses are listed below: Family History  Problem Relation Age of Onset  . Colon cancer Neg Hx   . Breast cancer Mother 34  . Heart attack Father   . Heart attack Maternal Uncle   . Cancer Maternal Uncle     NOS  . Prostate cancer Maternal Uncle   . Breast cancer Cousin     dx in her 43s    The patient has two sons who are cancer free. Her sister recently had a colonoscopy that found one polyp. Ms. Chiang has two brothers who are cancer free. Her father died at 2 from a heart attack. Her mother was diagnosed with breast cancer at 24 and died at 22. Her mother had three brothers and two sisters. One brother possibly had prostate cancer and another brother died of some form of cancer. Her father was one of 13 children. Most people had heart attacks, but one paternal cousin was diagnosed with breast cancer in her 70s. Patient's maternal ancestors are of [place] American and possibly Caucasian descent, and paternal ancestors are of African American descent. There is no reported Ashkenazi Jewish ancestry. There is no known consanguinity.  GENETIC TEST RESULTS: At the time of Ms. Biondo's visit, we recommended she pursue genetic testing of the Breast/Ovarian cancer gene panel. The Breast/Ovarian gene panel offered by [facility] includes sequencing and rearrangement  analysis for the following 20 genes:  ATM, BARD1, BRCA1, BRCA2, BRIP1, CDH1, CHEK2, EPCAM, FANCC, MLH1, MSH2, MSH6, NBN, PALB2, PMS2, PTEN, RAD51C, RAD51D, TP53, and XRCC2.   The report date is November 29, 2015.  Genetic testing was normal, and did not reveal a deleterious mutation in these genes. The test report has been scanned into EPIC and is located under the Molecular Pathology section of the Results Review tab.   We discussed with Ms. Bomba that since the current genetic testing is not perfect, it is possible there may be a gene mutation in one of these genes that current testing cannot detect, but that chance is small. We also discussed, that it is possible that another gene that has not yet been discovered, or that we have not yet tested, is responsible for the cancer diagnoses in the family, and it is, therefore, important to remain in touch with cancer genetics in the future so that we can continue to offer Ms. Latif the most up to date genetic testing.   CANCER SCREENING RECOMMENDATIONS: This result is reassuring and indicates that Ms. Loschiavo likely does not have an increased risk for a future cancer due to a mutation in one of these genes. This normal test also suggests that Ms. Guice's cancer was most likely not due to an inherited predisposition associated with one of these genes.  Most cancers happen by chance and this negative test suggests that her cancer falls into this category.  We, therefore, recommended she continue to follow the cancer management and screening guidelines provided by  her oncology and primary healthcare provider.   RECOMMENDATIONS FOR FAMILY MEMBERS: Women in this family might be at some increased risk of developing cancer, over the general population risk, simply due to the family history of cancer. We recommended women in this family have a yearly mammogram beginning at age 49, or 63 years younger than the earliest onset of cancer, an an annual clinical breast exam,  and perform monthly breast self-exams. Women in this family should also have a gynecological exam as recommended by their primary provider. All family members should have a colonoscopy by age 46.  FOLLOW-UP: Lastly, we discussed with Ms. Ridolfi that cancer genetics is a rapidly advancing field and it is possible that new genetic tests will be appropriate for her and/or her family members in the future. We encouraged her to remain in contact with cancer genetics on an annual basis so we can update her personal and family histories and let her know of advances in cancer genetics that may benefit this family.   Our contact number was provided. Ms. Umholtz questions were answered to her satisfaction, and she knows she is welcome to call us at anytime with additional questions or concerns.   Roma Kayser, MS, [facility] Certified Genetic Counselor [email or web address]

## 2015-12-01 ENCOUNTER — Other Ambulatory Visit: Payer: Self-pay | Admitting: *Deleted

## 2015-12-01 ENCOUNTER — Other Ambulatory Visit (HOSPITAL_BASED_OUTPATIENT_CLINIC_OR_DEPARTMENT_OTHER): Payer: 59

## 2015-12-01 ENCOUNTER — Ambulatory Visit (HOSPITAL_BASED_OUTPATIENT_CLINIC_OR_DEPARTMENT_OTHER): Payer: 59 | Admitting: Oncology

## 2015-12-01 VITALS — BP 115/79 | HR 71 | Temp 98.0°F | Resp 18 | Ht 67.0 in | Wt 180.3 lb

## 2015-12-01 DIAGNOSIS — C50912 Malignant neoplasm of unspecified site of left female breast: Secondary | ICD-10-CM

## 2015-12-01 DIAGNOSIS — C50612 Malignant neoplasm of axillary tail of left female breast: Secondary | ICD-10-CM

## 2015-12-01 DIAGNOSIS — L989 Disorder of the skin and subcutaneous tissue, unspecified: Secondary | ICD-10-CM | POA: Diagnosis not present

## 2015-12-01 DIAGNOSIS — R7989 Other specified abnormal findings of blood chemistry: Secondary | ICD-10-CM

## 2015-12-01 DIAGNOSIS — Z853 Personal history of malignant neoplasm of breast: Secondary | ICD-10-CM

## 2015-12-01 LAB — COMPREHENSIVE METABOLIC PANEL
ALT: 11 U/L (ref 0–55)
ANION GAP: 10 meq/L (ref 3–11)
AST: 14 U/L (ref 5–34)
Albumin: 4.5 g/dL (ref 3.5–5.0)
Alkaline Phosphatase: 68 U/L (ref 40–150)
BILIRUBIN TOTAL: 0.32 mg/dL (ref 0.20–1.20)
BUN: 18.2 mg/dL (ref 7.0–26.0)
CO2: 26 meq/L (ref 22–29)
Calcium: 9.6 mg/dL (ref 8.4–10.4)
Chloride: 106 mEq/L (ref 98–109)
Creatinine: 1 mg/dL (ref 0.6–1.1)
EGFR: 73 mL/min/{1.73_m2} — ABNORMAL LOW (ref >90)
Glucose: 85 mg/dl (ref 70–140)
Potassium: 4.5 mEq/L (ref 3.5–5.1)
Sodium: 142 mEq/L (ref 136–145)
Total Protein: 7.6 g/dL (ref 6.4–8.3)

## 2015-12-01 LAB — CBC WITH DIFFERENTIAL/PLATELET
BASO%: 0.2 % (ref 0.0–2.0)
Basophils Absolute: 0 10*3/uL (ref 0.0–0.1)
EOS%: 1.5 % (ref 0.0–7.0)
Eosinophils Absolute: 0.1 10*3/uL (ref 0.0–0.5)
HEMATOCRIT: 41.4 % (ref 34.8–46.6)
HGB: 13.8 g/dL (ref 11.6–15.9)
LYMPH#: 1.3 10*3/uL (ref 0.9–3.3)
LYMPH%: 31.8 % (ref 14.0–49.7)
MCH: 27.7 pg (ref 25.1–34.0)
MCHC: 33.3 g/dL (ref 31.5–36.0)
MCV: 83 fL (ref 79.5–101.0)
MONO#: 0.4 10*3/uL (ref 0.1–0.9)
MONO%: 8.7 % (ref 0.0–14.0)
NEUT%: 57.8 % (ref 38.4–76.8)
NEUTROS ABS: 2.3 10*3/uL (ref 1.5–6.5)
PLATELETS: 207 10*3/uL (ref 145–400)
RBC: 4.99 10*6/uL (ref 3.70–5.45)
RDW: 13.1 % (ref 11.2–14.5)
WBC: 4 10*3/uL (ref 3.9–10.3)

## 2015-12-01 MED ORDER — LIDOCAINE 5 % EX PTCH: 1.0000 | MEDICATED_PATCH | CUTANEOUS | Status: DC

## 2015-12-01 NOTE — Progress Notes (Signed)
[place]  [phone] Fax:([phone]) [phone]    ID: [name] DOB: 1969/04/19  MR#: [id number]  ZLD#:[id number]  Patient Care Team: Redmond School, MD as PCP - General (Internal Medicine) Stark Klein, MD as Consulting Physician (General Surgery) Chauncey Cruel, MD as Consulting Physician (Oncology) Arloa Koh, MD as Consulting Physician (Radiation Oncology) Vania Rea, MD as Consulting Physician (Obstetrics and Gynecology) Newman Pies, MD as Consulting Physician (Neurosurgery) Angelina Ok, MD as Referring Physician (Surgery) Sylvan Cheese, NP as Nurse Practitioner (Hematology and Oncology) Gypsy Decant, DC as Physician Assistant (Chiropractic Medicine) OTHER MD:  CHIEF COMPLAINT: Triple negative breast cancer   CURRENT TREATMENT: Observation  BREAST CANCER HISTORY: From the original intake note:  Robin Franklin herself noted a mass in her left axilla sometime in late November or she brought it to Dr. Nolon Rod attention and on 11/15/2014 she had a left mammogram and left ultrasound at the breast Center. The breast density was category 8. The left breast was entirely unremarkable, with no masses, distortion, or calcifications. In the left axilla there was a mass which was palpable and moderately firm. By ultrasound it measured 3.2 cm. There were no other axillary masses of concern.  Biopsy of the left axillary mass the same day showed ([id number]) a high-grade carcinoma which was estrogen and progesterone receptor negative, HER-2 negative, with a signals ratio of 1.53 and the number per cell of 2.60, with an MIB-1 of 95%. It was also gross cystic disease fluid protein negative, and negative for cytokeratin 20 and TTF-1. It was positive for cytokeratin 7. This was felt to be consistent with a breast primary, but gynecologic and other malignancies were also in the differential.  In general 04/08/2015 the patient had a diagnostic right  mammogram, which was negative. On the same day she had bilateral breast MRI. There were no masses or abnormal enhancement in either breast. In the left axilla there was a round enhancing mass measuring 4.6 cm. There were mildly prominent smaller immediately adjacent lymph nodes. He was no right axillary or internal mammary adenopathy. An incidental lipoma was noted in the left lateral chest wall.  Her subsequent history is as detailed below.  INTERVAL HISTORY:  [name] returns today for follow-up of her early stage,triple negative breast cancer.  From a breast cancer point of view she is doing fine. She does have some shooting pain sometimes in the inferior aspect of the left breast, laterally, which is of course where she had her main surgery.  REVIEW OF SYSTEMS:  What bothers her the most of course is her lower back. She tells me Dr. Arnoldo Morale told her surgery might help her mind not help so she is not doing that. She tried epidurals and they helped briefly. She is going to her chiropractor, Dr. Lovena Le, every week in [place] and he is giving her some steroid shots which sometimes help, in addition to manipulation. She doesn't think she is depressed but she is very irritable , and she hates to be that way with her family. She was to start a done another medication for that but she does not recall the name. She has mild sinus problems and some seasonal allergies. Otherwise a detailed review of systems today was noncontributory  PAST MEDICAL HISTORY: Past Medical History  Diagnosis Date  . Hypothyroidism   . Headache(784.0)   . Anxiety   . Breast cancer ([facility])   . Arthritis   . Joint pain   . S/P radiation therapy 05/17/2015  through 06/24/2015     Left breast 5040 cGy in 28 sessions; left supraclavicular/axillary region 5040 cGy in 28 sessions   . Family history of breast cancer     PAST SURGICAL HISTORY: Past Surgical History   Procedure Laterality Date  . Breast reduction surgery    . Cervical cone biopsy    . Cyst removed from neck    . Colonoscopy  11/06/2011    Procedure: COLONOSCOPY;  Surgeon: Dorothyann Peng, MD;  Location: [facility];  Service: Endoscopy;  Laterality: N/A;  11:30 AM  . Steriod injection      FAMILY HISTORY Family History  Problem Relation Age of Onset  . Colon cancer Neg Hx   . Breast cancer Mother 35  . Heart attack Father   . Heart attack Maternal Uncle   . Cancer Maternal Uncle     NOS  . Prostate cancer Maternal Uncle   . Breast cancer Cousin     dx in her 50s  the patient's father died from a myocardial infarction at the age of 74. [name] died from breast cancer at the age of 29. It was diagnosed at age 30. The patient has 2 brothers, one sister. There is no other history of breast or ovarian cancer in the family to her knowledge.  GYNECOLOGIC HISTORY:  Patient's last menstrual period was 07/20/2013 (approximate). Menarche age 24, first live birth age 89. The patient is GX P2. She used oral contraceptives for a few months remotely with no side effects. She stopped having periods in September 2014. 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 [name] is a Merchant navy officer with [employer]. At home it's just the 2 of them, with no pets. Robin Franklin's children from an earlier marriage are Robin Franklin. Robin Franklin, who lives in [place] and is a Public relations account executive; and Robin Franklin, who lives in [place] and is a Biochemist, clinical. The patient has 4 grandchildren +2 by adoption. She attends a [employer] locally    ADVANCED DIRECTIVES: Not in place   HEALTH MAINTENANCE: Social History  Substance Use Topics  . Smoking status: Never Smoker   . Smokeless tobacco: Not on file  . Alcohol Use: No     Colonoscopy: 2013/ [facility]  PAP: February 2015  Bone density: Never  Lipid  panel:  Allergies  Allergen Reactions  . Other Rash    Unknown Flu Vaccine from 2015 caused rash    Current Outpatient Prescriptions  Medication Sig Dispense Refill  . ALPRAZolam (XANAX) 0.5 MG tablet Take 0.5 mg by mouth daily as needed for anxiety. For anxiety    . Cyanocobalamin (VITAMIN B 12 PO) Take 1 tablet by mouth daily.    Regino Schultze Bandages & Supports (MEDICAL COMPRESSION STOCKINGS) MISC 1 each by Does not apply route daily. Apply to left arm daily 1 each 0  . gabapentin (NEURONTIN) 100 MG capsule TAKE 1 CAPSULE (100 MG TOTAL) BY MOUTH 2 (TWO) TIMES DAILY. 60 capsule 1  . levothyroxine (SYNTHROID, LEVOTHROID) 75 MCG tablet Take 75 mcg by mouth daily before breakfast.    . lidocaine (LIDODERM) 5 % Place 1 patch onto the skin daily. Remove & Discard patch within 12 hours or as directed by MD 30 patch 0  . Melatonin 3 MG TABS Take 3 mg by mouth at bedtime as needed (Sleep).    . Multiple Vitamin (MULTIVITAMIN WITH MINERALS) TABS tablet Take  1 tablet by mouth daily.    . naproxen sodium (ANAPROX) 220 MG tablet Take 220 mg by mouth 2 (two) times daily with a meal.    . Omega-3 Fatty Acids (FISH OIL) 1000 MG CAPS Take 1 capsule by mouth daily.    . Probiotic Product (PROBIOTIC DAILY PO) Take by mouth.    . Selenium 100 MCG CAPS Take 1 capsule by mouth daily.    . vitamin C (ASCORBIC ACID) 500 MG tablet Take 500 mg by mouth daily.     No current facility-administered medications for this visit.    OBJECTIVE: Middle-aged African-American woman  Who appears well Filed Vitals:   12/01/15 1413  BP: 115/79  Pulse: 71  Temp: 98 F (36.7 C)  Resp: 18     Body mass index is 28.23 kg/(m^2).    ECOG FS:1 - Symptomatic but completely ambulatory   Sclerae unicteric, EOMs intact Oropharynx clear  And moist No cervical or supraclavicular adenopathy Lungs no rales or rhonchi Heart regular rate and rhythm Abd soft, nontender, positive bowel sounds MSK no focal spinal tenderness, no  upper extremity lymphedema Neuro: nonfocal, well oriented, appropriate affect Breasts:  Status post prior left axillary resection and reduction mammoplasty. There is no evidence of left breast or axillary recurrence. Skin: Moving laterally from her axilla at the level of the lower third of the scapula  On the left back there is a subcutaneous mass measuring approximately 1-1/2 cm by a two thirds of a centimeter, slightly raised, not erythematous, not tender. This is of concern for subcutaneous metastasis  LAB RESULTS:  CMP     Component Value Date/Time   NA 142 12/01/2015 1349   NA 142 01/13/2014 1928   K 4.5 12/01/2015 1349   K 3.5* 01/13/2014 1928   CL 103 01/13/2014 1928   CO2 26 12/01/2015 1349   CO2 25 12/23/2010 1229   GLUCOSE 85 12/01/2015 1349   GLUCOSE 82 01/13/2014 1928   BUN 18.2 12/01/2015 1349   BUN 14 01/13/2014 1928   CREATININE 1.0 12/01/2015 1349   CREATININE 1.00 01/13/2014 1928   CALCIUM 9.6 12/01/2015 1349   CALCIUM 9.1 12/23/2010 1229   PROT 7.6 12/01/2015 1349   ALBUMIN 4.5 12/01/2015 1349   AST 14 12/01/2015 1349   ALT 11 12/01/2015 1349   ALKPHOS 68 12/01/2015 1349   BILITOT 0.32 12/01/2015 1349   GFRNONAA >60 12/23/2010 1229   GFRAA  12/23/2010 1229    >60        The eGFR has been calculated using the MDRD equation. This calculation has not been validated in all clinical situations. eGFR's persistently <60 mL/min signify possible Chronic Kidney Disease.    INo results found for: SPEP, UPEP  Lab Results  Component Value Date   WBC 4.0 12/01/2015   NEUTROABS 2.3 12/01/2015   HGB 13.8 12/01/2015   HCT 41.4 12/01/2015   MCV 83.0 12/01/2015   PLT 207 12/01/2015      Chemistry      Component Value Date/Time   NA 142 12/01/2015 1349   NA 142 01/13/2014 1928   K 4.5 12/01/2015 1349   K 3.5* 01/13/2014 1928   CL 103 01/13/2014 1928   CO2 26 12/01/2015 1349   CO2 25 12/23/2010 1229   BUN 18.2 12/01/2015 1349   BUN 14 01/13/2014 1928    CREATININE 1.0 12/01/2015 1349   CREATININE 1.00 01/13/2014 1928      Component Value Date/Time   CALCIUM 9.6 12/01/2015 1349  CALCIUM 9.1 12/23/2010 1229   ALKPHOS 68 12/01/2015 1349   AST 14 12/01/2015 1349   ALT 11 12/01/2015 1349   BILITOT 0.32 12/01/2015 1349       No results found for: LABCA2  No components found for: LABCA125  No results for input(s): INR in the last 168 hours.  Urinalysis    Component Value Date/Time   COLORURINE YELLOW 01/13/2014 1924   APPEARANCEUR CLEAR 01/13/2014 1924   LABSPEC >1.030* 01/13/2014 1924   PHURINE 5.5 01/13/2014 1924   GLUCOSEU NEGATIVE 01/13/2014 1924   HGBUR NEGATIVE 01/13/2014 1924   BILIRUBINUR NEGATIVE 01/13/2014 1924   KETONESUR NEGATIVE 01/13/2014 1924   PROTEINUR NEGATIVE 01/13/2014 1924   UROBILINOGEN 0.2 01/13/2014 1924   NITRITE NEGATIVE 01/13/2014 1924   LEUKOCYTESUR NEGATIVE 01/13/2014 1924    STUDIES: CLINICAL DATA: Status post left lumpectomy, chemotherapy and radiation therapy for left axillary breast carcinoma earlier this year.  LABS: None obtained today.  EXAM: BILATERAL BREAST MRI WITH AND WITHOUT CONTRAST  TECHNIQUE: Multiplanar, multisequence MR images of both breasts were obtained prior to and following the intravenous administration of 17 ml of MultiHance.  THREE-DIMENSIONAL MR IMAGE RENDERING ON INDEPENDENT WORKSTATION:  Three-dimensional MR images were rendered by post-processing of the original MR data on an independent workstation. The three-dimensional MR images were interpreted, and findings are reported in the following complete MRI report for this study. Three dimensional images were evaluated at the independent DynaCad workstation  COMPARISON: Previous exam(s).  FINDINGS: Breast composition: a. Almost entirely fat.  Background parenchymal enhancement: Minimal  Right breast: No mass or abnormal enhancement.  Left breast: Postradiation changes and left axillary  tail post lumpectomy changes. No mass or abnormal enhancement.  Lymph nodes: No abnormal appearing lymph nodes.  Ancillary findings: None.  IMPRESSION: No evidence of malignancy.  RECOMMENDATION: Bilateral diagnostic mammogram in 1 year.  BI-RADS CATEGORY 2: Benign.   Electronically Signed  By: Claudie Revering M.D.  On: 11/01/2015 16:04   ASSESSMENT: 55 y.o. [place] woman status post biopsy 11/15/2014 of a 4.6 cm left axillary mass showing a poorly differentiated carcinoma, triple negative, with an MIB-1 of 90%.  (1) s/p left axillary lymph node dissection under Dr.Howard-McNatt 12/23/2014 with 1 of 14 lymph nodes positive; TX N1, stage II; repeat prognostic panel again triple negative  (2) adjuvant chemotherapy started 01/17/2015, consisting of cyclophosphamide and doxorubicin 4 given in dose dense fashion, completed 02/28/2015, followed by paclitaxel and carboplatin given weekly with 12 cycles planned, but stopped after only 3 cycles because of progressive neuropathy, last dose 03/28/2015  (3) radiation 05/17/2015 through 06/24/2015:Left breast 5040 cGy in 28 sessions; left supraclavicular/axillary region 5040 cGy in 28 sessions  (4) genetics testing November 29 2015 through the Breast/Ovarian gene panel offered by [facility] found no deleterious mutations in ATM, BARD1, BRCA1, BRCA2, BRIP1, CDH1, CHEK2, EPCAM, FANCC, MLH1, MSH2, MSH6, NBN, PALB2, PMS2, PTEN, RAD51C, RAD51D, TP53, and XRCC2  PLAN: Austen looks terrific and she is recovering well from the residuals of her prior treatment, with only very minimal 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 The back problem is not related.  I am concerned regarding the left posterior chest subcutaneous mass. She tells me this has been there for some months but is getting bigger and when she lies on it now it rather sure some. We went back and looked at her October CT scan of the chest and there is  something in that area but I certainly cannot make out what it might be. I offered  to repeat a CT of the chest at this point but she is concerned regarding financial issues.  I discussed this with Dr. Ginger Carne and she will be seeing Barrie is in the next week or so and either biopsy or excise the area in question at her discretion. Accordingly I am making a viral return appointment here in 4 weeks or so just to review those results. Hopefully it will be benign. If malignant we will have to proceed to full restaging studies.   Chauncey Cruel, MD   12/01/2015 2:33 PM  Medical Oncology and Hematology [facility] [address] [place], [place] [address] Tel. [phone]    Fax. [phone]

## 2015-12-02 LAB — VITAMIN D 25 HYDROXY (VIT D DEFICIENCY, FRACTURES): Vitamin D, 25-Hydroxy: 29.8 ng/mL — ABNORMAL LOW (ref 30.0–100.0)

## 2015-12-04 ENCOUNTER — Telehealth: Payer: Self-pay | Admitting: Oncology

## 2015-12-04 NOTE — Telephone Encounter (Signed)
Called and left a message with new appointment per pof °

## 2015-12-05 ENCOUNTER — Encounter: Payer: Self-pay | Admitting: Oncology

## 2015-12-06 ENCOUNTER — Telehealth: Payer: Self-pay | Admitting: Oncology

## 2015-12-06 NOTE — Telephone Encounter (Signed)
pt cld left voicemail wanting to know why appt on 2/3-cld pt back and spoke to pt and avd per GM to sch pt in 4 weeks-pt understood

## 2015-12-12 ENCOUNTER — Encounter: Payer: Self-pay | Admitting: Nurse Practitioner

## 2015-12-12 DIAGNOSIS — C50912 Malignant neoplasm of unspecified site of left female breast: Secondary | ICD-10-CM

## 2015-12-12 NOTE — Progress Notes (Signed)
Mailed updated copy of survivorship care plan to Robin Franklin reflecting negative genetic testing results.

## 2016-01-02 ENCOUNTER — Ambulatory Visit (HOSPITAL_BASED_OUTPATIENT_CLINIC_OR_DEPARTMENT_OTHER): Payer: 59

## 2016-01-02 ENCOUNTER — Ambulatory Visit (HOSPITAL_BASED_OUTPATIENT_CLINIC_OR_DEPARTMENT_OTHER): Payer: 59 | Admitting: Oncology

## 2016-01-02 VITALS — BP 99/67 | HR 67 | Temp 98.3°F | Resp 18 | Ht 67.0 in | Wt 173.4 lb

## 2016-01-02 DIAGNOSIS — Z853 Personal history of malignant neoplasm of breast: Secondary | ICD-10-CM

## 2016-01-02 DIAGNOSIS — T451X5A Adverse effect of antineoplastic and immunosuppressive drugs, initial encounter: Secondary | ICD-10-CM

## 2016-01-02 DIAGNOSIS — G629 Polyneuropathy, unspecified: Secondary | ICD-10-CM | POA: Diagnosis not present

## 2016-01-02 DIAGNOSIS — C50612 Malignant neoplasm of axillary tail of left female breast: Secondary | ICD-10-CM

## 2016-01-02 DIAGNOSIS — E039 Hypothyroidism, unspecified: Secondary | ICD-10-CM | POA: Diagnosis not present

## 2016-01-02 DIAGNOSIS — G620 Drug-induced polyneuropathy: Secondary | ICD-10-CM

## 2016-01-02 LAB — TSH: TSH: 0.587 m[IU]/L (ref 0.308–3.960)

## 2016-01-02 NOTE — Progress Notes (Signed)
[facility]  [phone] [phone]    ID: Robin Franklin DOB: May 23, 1961  MR#: [id number]  NZV#:[id number]  Patient Care Team: Redmond School, MD as PCP - General (Internal Medicine) Stark Klein, MD as Consulting Physician (General Surgery) Chauncey Cruel, MD as Consulting Physician (Oncology) Arloa Koh, MD as Consulting Physician (Radiation Oncology) Vania Rea, MD as Consulting Physician (Obstetrics and Gynecology) Newman Pies, MD as Consulting Physician (Neurosurgery) Angelina Ok, MD as Referring Physician (Surgery) Sylvan Cheese, NP as Nurse Practitioner (Hematology and Oncology) Gypsy Decant, DC as Physician Assistant (Chiropractic Medicine) OTHER MD:  CHIEF COMPLAINT: Triple negative breast cancer   CURRENT TREATMENT: Observation  BREAST CANCER HISTORY: From the original intake note:  Robin Franklin herself noted a mass in her left axilla sometime in late November or she brought it to Dr. Nolon Rod attention and on 11/15/2014 she had a left mammogram and left ultrasound at the [facility] Center. The breast density was category 8. The left breast was entirely unremarkable, with no masses, distortion, or calcifications. In the left axilla there was a mass which was palpable and moderately firm. By ultrasound it measured 3.2 cm. There were no other axillary masses of concern.  Biopsy of the left axillary mass the same day showed ([id number]) a high-grade carcinoma which was estrogen and progesterone receptor negative, HER-2 negative, with a signals ratio of 1.53 and the number per cell of 2.60, with an MIB-1 of 95%. It was also gross cystic disease fluid protein negative, and negative for cytokeratin 20 and TTF-1. It was positive for cytokeratin 7. This was felt to be consistent with a breast primary, but gynecologic and other malignancies were also in the differential.  In general 04/08/2015 the patient had a diagnostic right  mammogram, which was negative. On the same day she had bilateral breast MRI. There were no masses or abnormal enhancement in either breast. In the left axilla there was a round enhancing mass measuring 4.6 cm. There were mildly prominent smaller immediately adjacent lymph nodes. He was no right axillary or internal mammary adenopathy. An incidental lipoma was noted in the left lateral chest wall.  Her subsequent history is as detailed below.  INTERVAL HISTORY:  Robin Franklin returns today for follow-up of her triple negative breast cancer. Since her last visit here she had excision of the left lateral chest wall [facility]. This proved to be a benign cyst. The pathology is listed below.   REVIEW OF SYSTEMS: Robin Franklin lost her job at [employer] because there were too many restrictions. Her job involves quite a bit of walking. She continues to have numbness and tingling problems which she notices mostly in the evening when she gets too bad. She is working closely with her chiropractor in [place] but she is still very limited in what she can do. She has applied for disability but has been denied twice. She is a feeling this. Aside from that problem she has some night sweats, insomnia, fatigue, sinus problems, ringing in her ears, back and joint pain, and after she changed her diet which is now healthier, she started having some migraines. These are now getting better. A detailed review of systems today was otherwise stable.   PAST MEDICAL HISTORY: Past Medical History  Diagnosis Date  . Hypothyroidism   . Headache(784.0)   . Anxiety   . Breast cancer ([place])   . Arthritis   . Joint pain   . S/P radiation therapy 05/17/2015 through 06/24/2015  Left breast 5040 cGy in 28 sessions; left supraclavicular/axillary region 5040 cGy in 28 sessions   . Family history of breast cancer     PAST SURGICAL HISTORY: Past Surgical History    Procedure Laterality Date  . Breast reduction surgery    . Cervical cone biopsy    . Cyst removed from neck    . Colonoscopy  11/06/2011    Procedure: COLONOSCOPY;  Surgeon: Dorothyann Peng, MD;  Location: [facility];  Service: Endoscopy;  Laterality: N/A;  11:30 AM  . Steriod injection      FAMILY HISTORY Family History  Problem Relation Age of Onset  . Colon cancer Neg Hx   . Breast cancer Mother 17  . Heart attack Father   . Heart attack Maternal Uncle   . Cancer Maternal Uncle     NOS  . Prostate cancer Maternal Uncle   . Breast cancer Cousin     dx in her 63s  the patient's father died from a myocardial infarction at the age of 69. [name] died from breast cancer at the age of 30. It was diagnosed at age 74. The patient has 2 brothers, one sister. There is no other history of breast or ovarian cancer in the family to her knowledge.  GYNECOLOGIC HISTORY:  Patient's last menstrual period was 07/20/2013 (approximate). Menarche age 22, first live birth age 40. The patient is GX P2. She used oral contraceptives for a few months remotely with no side effects. She stopped having periods in September 2014. 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 At home it's just the 2 of them, with no pets. Robin Franklin's children from an earlier marriage are Robin Franklin. Robin Franklin, who lives in [place] and is a Public relations account executive; and Robin Franklin, who lives in [place] and is a Biochemist, clinical. The patient has 4 grandchildren +2 by adoption. She attends a [employer] locally    ADVANCED DIRECTIVES: Not in place   HEALTH MAINTENANCE: Social History  Substance Use Topics  . Smoking status: Never Smoker   . Smokeless tobacco: Not on file  . Alcohol Use: No     Colonoscopy: 2013/ [facility]  PAP: February 2015  Bone density: Never  Lipid  panel:  Allergies  Allergen Reactions  . Other Rash    Unknown Flu Vaccine from 2015 caused rash    Current Outpatient Prescriptions  Medication Sig Dispense Refill  . ALPRAZolam (XANAX) 0.5 MG tablet Take 0.5 mg by mouth daily as needed for anxiety. For anxiety    . Cyanocobalamin (VITAMIN B 12 PO) Take 1 tablet by mouth daily.    Marland Kitchen gabapentin (NEURONTIN) 100 MG capsule TAKE 1 CAPSULE (100 MG TOTAL) BY MOUTH 2 (TWO) TIMES DAILY. 60 capsule 1  . levothyroxine (SYNTHROID, LEVOTHROID) 75 MCG tablet Take 75 mcg by mouth daily before breakfast.    . lidocaine (LIDODERM) 5 % Place 1 patch onto the skin daily. Remove & Discard patch within 12 hours or as directed by MD 30 patch 0  . Melatonin 3 MG TABS Take 3 mg by mouth at bedtime as needed (Sleep).    . Multiple Vitamin (MULTIVITAMIN WITH MINERALS) TABS tablet Take 1 tablet by mouth daily.    . naproxen sodium (ANAPROX) 220 MG tablet Take 220 mg by mouth 2 (two) times daily with a meal.    .  Omega-3 Fatty Acids (FISH OIL) 1000 MG CAPS Take 1 capsule by mouth daily.    . Probiotic Product (PROBIOTIC DAILY PO) Take by mouth.    . Selenium 100 MCG CAPS Take 1 capsule by mouth daily.    . vitamin C (ASCORBIC ACID) 500 MG tablet Take 500 mg by mouth daily.     No current facility-administered medications for this visit.    OBJECTIVE: Middle-aged African-American woman in no acute distress  Filed Vitals:   01/02/16 0942  BP: 99/67  Pulse: 67  Temp: 98.3 F (36.8 C)  Resp: 18     Body mass index is 27.15 kg/(m^2).    ECOG FS:1 - Symptomatic but completely ambulatory   Sclerae unicteric, pupils round and equal Oropharynx clear and moist-- no thrush or other lesions No cervical or supraclavicular adenopathy Lungs no rales or rhonchi Heart regular rate and rhythm Abd soft, nontender, positive bowel sounds MSK no focal spinal tenderness, no upper extremity lymphedema Neuro: nonfocal, well oriented, appropriate affect Breasts: The right  breast is unremarkable. The left breast status post lumpectomy and radiation. The cosmetic result is good. There is no evidence of local recurrence. Skin: The left flank incision is healing nicely; Steri-Strips are still in place    LAB RESULTS:  CMP     Component Value Date/Time   NA 142 12/01/2015 1349   NA 142 01/13/2014 1928   K 4.5 12/01/2015 1349   K 3.5* 01/13/2014 1928   CL 103 01/13/2014 1928   CO2 26 12/01/2015 1349   CO2 25 12/23/2010 1229   GLUCOSE 85 12/01/2015 1349   GLUCOSE 82 01/13/2014 1928   BUN 18.2 12/01/2015 1349   BUN 14 01/13/2014 1928   CREATININE 1.0 12/01/2015 1349   CREATININE 1.00 01/13/2014 1928   CALCIUM 9.6 12/01/2015 1349   CALCIUM 9.1 12/23/2010 1229   PROT 7.6 12/01/2015 1349   ALBUMIN 4.5 12/01/2015 1349   AST 14 12/01/2015 1349   ALT 11 12/01/2015 1349   ALKPHOS 68 12/01/2015 1349   BILITOT 0.32 12/01/2015 1349   GFRNONAA >60 12/23/2010 1229   GFRAA  12/23/2010 1229    >60        The eGFR has been calculated using the MDRD equation. This calculation has not been validated in all clinical situations. eGFR's persistently <60 mL/min signify possible Chronic Kidney Disease.    INo results found for: SPEP, UPEP  Lab Results  Component Value Date   WBC 4.0 12/01/2015   NEUTROABS 2.3 12/01/2015   HGB 13.8 12/01/2015   HCT 41.4 12/01/2015   MCV 83.0 12/01/2015   PLT 207 12/01/2015      Chemistry      Component Value Date/Time   NA 142 12/01/2015 1349   NA 142 01/13/2014 1928   K 4.5 12/01/2015 1349   K 3.5* 01/13/2014 1928   CL 103 01/13/2014 1928   CO2 26 12/01/2015 1349   CO2 25 12/23/2010 1229   BUN 18.2 12/01/2015 1349   BUN 14 01/13/2014 1928   CREATININE 1.0 12/01/2015 1349   CREATININE 1.00 01/13/2014 1928      Component Value Date/Time   CALCIUM 9.6 12/01/2015 1349   CALCIUM 9.1 12/23/2010 1229   ALKPHOS 68 12/01/2015 1349   AST 14 12/01/2015 1349   ALT 11 12/01/2015 1349   BILITOT 0.32 12/01/2015 1349        No results found for: LABCA2  No components found for: LABCA125  No results for input(s): INR in  the last 168 hours.  Urinalysis    Component Value Date/Time   COLORURINE YELLOW 01/13/2014 1924   APPEARANCEUR CLEAR 01/13/2014 1924   LABSPEC >1.030* 01/13/2014 1924   PHURINE 5.5 01/13/2014 1924   GLUCOSEU NEGATIVE 01/13/2014 1924   HGBUR NEGATIVE 01/13/2014 1924   BILIRUBINUR NEGATIVE 01/13/2014 1924   KETONESUR NEGATIVE 01/13/2014 1924   PROTEINUR NEGATIVE 01/13/2014 1924   UROBILINOGEN 0.2 01/13/2014 1924   NITRITE NEGATIVE 01/13/2014 1924   LEUKOCYTESUR NEGATIVE 01/13/2014 1924    STUDIES: CLINICAL DATA: Status post left lumpectomy, chemotherapy and radiation therapy for left axillary breast carcinoma earlier this year.  LABS: None obtained today.  EXAM: BILATERAL BREAST MRI WITH AND WITHOUT CONTRAST  TECHNIQUE: Multiplanar, multisequence MR images of both breasts were obtained prior to and following the intravenous administration of 17 ml of MultiHance.  THREE-DIMENSIONAL MR IMAGE RENDERING ON INDEPENDENT WORKSTATION:  Three-dimensional MR images were rendered by post-processing of the original MR data on an independent workstation. The three-dimensional MR images were interpreted, and findings are reported in the following complete MRI report for this study. Three dimensional images were evaluated at the independent DynaCad workstation  COMPARISON: Previous exam(s).  FINDINGS: Breast composition: a. Almost entirely fat.  Background parenchymal enhancement: Minimal  Right breast: No mass or abnormal enhancement.  Left breast: Postradiation changes and left axillary tail post lumpectomy changes. No mass or abnormal enhancement.  Lymph nodes: No abnormal appearing lymph nodes.  Ancillary findings: None.  IMPRESSION: No evidence of malignancy.  RECOMMENDATION: Bilateral diagnostic mammogram in 1 year.  BI-RADS CATEGORY  2: Benign.   Electronically Signed  By: Claudie Revering M.D.  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   I have personally reviewed the slides and/or other related materials referenced, and have edited the report as part of my pathologic assessment and final interpretation.  Electronically Signed Out By:   Areta Haber , MD 12/27/2015 15:00:39  brd/wl  Specimen(s) Received  Left chest wall mass   Clinical History Malignant neoplasm of central portion of female breast     Gross Description Received labeled "left chest wall mass" is a 2.9 x 1.4 cm skin excision, excised to a depth of 1.9 cm. The specimen is oriented by surgeon with a short stitch indicating superior aspect and a long stitch indicating lateral aspect. The superior margin is designated as 12:00 and the lateral margin is designated as 3:00. 3-6-9:00 is inked blue and the 9-12 -3:00 is inked black. There is a 1.9 x 1.0 x 0.9 cm cystic lesion in the subcutaneous tissue which is 0.5 cm from 12:00, 1.3 cm from 3:00, 0.3 cm from 6:00 and 0.7 cm from 9:00. The specimen is serially sectioned and entirely submitted as follows:  BLOCK SUMMARY: A1          12 to 3:00 margin, en face A2          3 to 6:00 margin, en face A3          6 to 9:00 margin, en face A4          9 to 12:00 margin, en face A5-7  specimen is serially sectioned from 3 to 9:00 and entirely submitted.  Cloria Spring , M.D., RESIDE  ASSESSMENT: 55 y.o. [place] woman status post biopsy 11/15/2014 of a 4.6 cm left axillary mass showing a poorly differentiated carcinoma, triple  negative, with an MIB-1 of 90%.  (1) s/p left axillary lymph node dissection under Dr.Howard-McNatt 12/23/2014 with 1 of 14 lymph nodes positive; TX N1, stage II; repeat prognostic panel again triple negative  (2) adjuvant chemotherapy started 01/17/2015, consisting of cyclophosphamide and doxorubicin 4 given in dose dense fashion, completed 02/28/2015, followed by paclitaxel and carboplatin given weekly with 12 cycles planned, but stopped after only 3 cycles because of progressive neuropathy, last dose 03/28/2015  (3) radiation 05/17/2015 through 06/24/2015:Left breast 5040 cGy in 28 sessions; left supraclavicular/axillary region 5040 cGy in 28 sessions  (4) genetics testing November 29 2015 through the Breast/Ovarian gene panel offered by [facility] found no deleterious mutations in ATM, BARD1, BRCA1, BRCA2, BRIP1, CDH1, CHEK2, EPCAM, FANCC, MLH1, MSH2, MSH6, NBN, PALB2, PMS2, PTEN, RAD51C, RAD51D, TP53, and XRCC2  PLAN: Deysy's posterior chest wall mass proved to be a keratinous cyst. I reassured her as did Dr. Alvan Dame but that is entirely benign and not related to her prior history of breast cancer.  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 It is unclear to me why it should not be more symmetrical. She had an MRI of the lumbar spine in October 2014 which showed significant degenerative disc disease. Possibly this is part of the issue. I have urged her to take the gabapentin at bedtime as prescribed.  She is working closely with a Restaurant manager, fast food in [place] and hopes to be able to regain some more functions.  Today we discussed her low vitamin D level and I think it would be prudent for her to start oral replacement. Also she is trying to take the Synthroid in a different way. She was thinking of stopping it and "seeing what happens". I strongly discouraged. She understands if she stops her Synthroid she will start gaining weight,  become constipated, and have other problems which she really does not want at this point.  Otherwise the plan is to see her on an every three-month basis until she gets to the 2 year mark after which we will liberalize the follow-up interval.  She knows to call for any problems that may develop before her next visit here.   Chauncey Cruel, MD   01/02/2016 9:50 AM  Medical Oncology and Hematology [facility] [address] [place], [place] [address] Tel. [phone]    Fax. [phone]

## 2016-01-07 ENCOUNTER — Encounter (HOSPITAL_COMMUNITY): Payer: Self-pay

## 2016-01-19 ENCOUNTER — Encounter: Payer: Self-pay | Admitting: Oncology

## 2016-01-19 NOTE — Progress Notes (Signed)
faxed corrected form [phone] and mailed copy to patient

## 2016-01-19 NOTE — Progress Notes (Signed)
Leaving form for dr to sign. Had wrong dr on last forms. [employer] sent forms again

## 2016-01-30 ENCOUNTER — Telehealth: Payer: Self-pay | Admitting: *Deleted

## 2016-01-30 NOTE — Telephone Encounter (Signed)
This RN received call from Dr Candelaria Celeste stating he is a fellow oncologist/hemetologist who is following up on pt's status for continued disability.  Inquiry per MD " is pt medically able to return to work ?" " Is she still considered disabled and what is the reason ?"  Direct return call number given as [phone].  Dr Donato Schultz is aware that treating MD is out of the office until next week, but he was hoping to get above information.  This RN will inquire with NP per above.

## 2016-02-01 ENCOUNTER — Other Ambulatory Visit: Payer: Self-pay | Admitting: *Deleted

## 2016-02-01 ENCOUNTER — Telehealth: Payer: Self-pay | Admitting: *Deleted

## 2016-02-01 NOTE — Telephone Encounter (Signed)
This RN returned call to Dr Salina April and obtained identified VM.  Message left informing MD that call would need to be returned by Dr Jana Hakim due to his evaluation of pt.  [facility] nor other MD's have assessed the pt and cannot verify concerns he wants addressed.  This RN's name and return call number given as well as date of MD return.  Correspondence per above MD will be given to MD upon his return to clinic.

## 2016-02-02 ENCOUNTER — Ambulatory Visit (HOSPITAL_COMMUNITY): Payer: 59

## 2016-02-17 ENCOUNTER — Encounter: Payer: Self-pay | Admitting: Oncology

## 2016-02-17 NOTE — Progress Notes (Signed)
See prev notes. I sent forms to medical records 09/2015 and 01/2016 that were faxed with notes

## 2016-06-11 ENCOUNTER — Telehealth: Payer: Self-pay | Admitting: *Deleted

## 2016-06-11 NOTE — Telephone Encounter (Signed)
This RN returned call to per her VM- wanting to inquire about mammogram currently scheduled this Wednesday at [facility]. " wanted to talk to someone because I am not sure if I should come back to [facility] and have it done there?  Obtained identified VM- message left to return call.

## 2016-06-12 ENCOUNTER — Other Ambulatory Visit: Payer: Self-pay | Admitting: *Deleted

## 2016-06-14 DIAGNOSIS — Z853 Personal history of malignant neoplasm of breast: Secondary | ICD-10-CM | POA: Insufficient documentation

## 2016-07-02 ENCOUNTER — Other Ambulatory Visit: Payer: Self-pay | Admitting: Obstetrics & Gynecology

## 2016-07-03 LAB — CYTOLOGY - PAP

## 2016-10-01 ENCOUNTER — Other Ambulatory Visit (HOSPITAL_COMMUNITY): Payer: Self-pay | Admitting: Internal Medicine

## 2016-10-01 ENCOUNTER — Other Ambulatory Visit (HOSPITAL_BASED_OUTPATIENT_CLINIC_OR_DEPARTMENT_OTHER): Payer: 59 | Admitting: *Deleted

## 2016-10-01 ENCOUNTER — Other Ambulatory Visit (HOSPITAL_BASED_OUTPATIENT_CLINIC_OR_DEPARTMENT_OTHER): Payer: 59

## 2016-10-01 ENCOUNTER — Ambulatory Visit (HOSPITAL_COMMUNITY)
Admission: RE | Admit: 2016-10-01 | Discharge: 2016-10-01 | Disposition: A | Discharge status: Home / Self Care | Payer: 59 | Source: Ambulatory Visit | Attending: Internal Medicine | Admitting: Internal Medicine

## 2016-10-01 ENCOUNTER — Ambulatory Visit (HOSPITAL_BASED_OUTPATIENT_CLINIC_OR_DEPARTMENT_OTHER): Payer: 59 | Admitting: Oncology

## 2016-10-01 VITALS — BP 110/69 | HR 60 | Temp 98.2°F | Resp 18 | Ht 67.0 in | Wt 176.9 lb

## 2016-10-01 DIAGNOSIS — R079 Chest pain, unspecified: Secondary | ICD-10-CM | POA: Diagnosis present

## 2016-10-01 DIAGNOSIS — R0789 Other chest pain: Secondary | ICD-10-CM

## 2016-10-01 DIAGNOSIS — C50612 Malignant neoplasm of axillary tail of left female breast: Secondary | ICD-10-CM

## 2016-10-01 DIAGNOSIS — R5383 Other fatigue: Secondary | ICD-10-CM

## 2016-10-01 DIAGNOSIS — T451X5A Adverse effect of antineoplastic and immunosuppressive drugs, initial encounter: Secondary | ICD-10-CM

## 2016-10-01 DIAGNOSIS — E039 Hypothyroidism, unspecified: Secondary | ICD-10-CM | POA: Diagnosis not present

## 2016-10-01 DIAGNOSIS — G620 Drug-induced polyneuropathy: Secondary | ICD-10-CM

## 2016-10-01 DIAGNOSIS — Z853 Personal history of malignant neoplasm of breast: Secondary | ICD-10-CM

## 2016-10-01 LAB — COMPREHENSIVE METABOLIC PANEL
ALT: 11 U/L (ref 0–55)
AST: 14 U/L (ref 5–34)
Albumin: 4 g/dL (ref 3.5–5.0)
Alkaline Phosphatase: 62 U/L (ref 40–150)
Anion Gap: 10 mEq/L (ref 3–11)
BILIRUBIN TOTAL: 0.41 mg/dL (ref 0.20–1.20)
BUN: 13.3 mg/dL (ref 7.0–26.0)
CHLORIDE: 108 meq/L (ref 98–109)
CO2: 26 meq/L (ref 22–29)
CREATININE: 0.9 mg/dL (ref 0.6–1.1)
Calcium: 9.7 mg/dL (ref 8.4–10.4)
EGFR: 80 mL/min/{1.73_m2} — ABNORMAL LOW (ref >90)
GLUCOSE: 106 mg/dL (ref 70–140)
Potassium: 4 mEq/L (ref 3.5–5.1)
SODIUM: 144 meq/L (ref 136–145)
TOTAL PROTEIN: 7.4 g/dL (ref 6.4–8.3)

## 2016-10-01 LAB — CBC WITH DIFFERENTIAL/PLATELET
BASO%: 0.8 % (ref 0.0–2.0)
Basophils Absolute: 0 10*3/uL (ref 0.0–0.1)
EOS%: 1.6 % (ref 0.0–7.0)
Eosinophils Absolute: 0.1 10*3/uL (ref 0.0–0.5)
HCT: 41.7 % (ref 34.8–46.6)
HGB: 13.3 g/dL (ref 11.6–15.9)
LYMPH%: 38.2 % (ref 14.0–49.7)
MCH: 27 pg (ref 25.1–34.0)
MCHC: 32 g/dL (ref 31.5–36.0)
MCV: 84.6 fL (ref 79.5–101.0)
MONO#: 0.3 10*3/uL (ref 0.1–0.9)
MONO%: 6.2 % (ref 0.0–14.0)
NEUT%: 53.2 % (ref 38.4–76.8)
NEUTROS ABS: 2.2 10*3/uL (ref 1.5–6.5)
Platelets: 216 10*3/uL (ref 145–400)
RBC: 4.93 10*6/uL (ref 3.70–5.45)
RDW: 13.2 % (ref 11.2–14.5)
WBC: 4.2 10*3/uL (ref 3.9–10.3)
lymph#: 1.6 10*3/uL (ref 0.9–3.3)

## 2016-10-01 LAB — IRON AND TIBC
%SAT: 38 % (ref 21–57)
IRON: 123 ug/dL (ref 41–142)
TIBC: 321 ug/dL (ref 236–444)
UIBC: 198 ug/dL (ref 120–384)

## 2016-10-01 LAB — FERRITIN: Ferritin: 52 ng/ml (ref 9–269)

## 2016-10-01 LAB — TSH: TSH: 0.88 m(IU)/L (ref 0.308–3.960)

## 2016-10-01 MED ORDER — OMEPRAZOLE 40 MG PO CPDR: 40.0000 mg | DELAYED_RELEASE_CAPSULE | Freq: Every day | ORAL | 4 refills | Status: DC

## 2016-10-01 NOTE — Progress Notes (Signed)
[facility]  Telephone:([phone]) [phone] Fax:(336) [phone]    ID: Robin Franklin DOB: 14-Aug-1961  MR#: [id number]  QFZ#:[id number]  Patient Care Team: Elfredia Nevins, MD as PCP - General (Internal Medicine) Almond Lint, MD as Consulting Physician (General Surgery) Lowella Dell, MD as Consulting Physician (Oncology) Chipper Herb, MD as Consulting Physician (Radiation Oncology) Annamaria Helling, MD as Consulting Physician (Obstetrics and Gynecology) Tressie Stalker, MD as Consulting Physician (Neurosurgery) Lovena Neighbours, MD as Referring Physician (Surgery) Salomon Fick, NP as Nurse Practitioner (Hematology and Oncology) Reatha Harps, DC as Physician Assistant (Chiropractic Medicine) OTHER MD:  CHIEF COMPLAINT: Triple negative breast cancer   CURRENT TREATMENT: Observation  BREAST CANCER HISTORY: From the original intake note:  Robin Franklin herself noted a mass in her left axilla sometime in late November or she brought it to Dr. Sharyon Medicus attention and on 11/15/2014 she had a left mammogram and left ultrasound at the breast Center. The breast density was category 8. The left breast was entirely unremarkable, with no masses, distortion, or calcifications. In the left axilla there was a mass which was palpable and moderately firm. By ultrasound it measured 3.2 cm. There were no other axillary masses of concern.  Biopsy of the left axillary mass the same day showed ([id number]) a high-grade carcinoma which was estrogen and progesterone receptor negative, HER-2 negative, with a signals ratio of 1.53 and the number per cell of 2.60, with an MIB-1 of 95%. It was also gross cystic disease fluid protein negative, and negative for cytokeratin 20 and TTF-1. It was positive for cytokeratin 7. This was felt to be consistent with a breast primary, but gynecologic and other malignancies were also in the differential.  In general 04/08/2015 the patient had a diagnostic right  mammogram, which was negative. On the same day she had bilateral breast MRI. There were no masses or abnormal enhancement in either breast. In the left axilla there was a round enhancing mass measuring 4.6 cm. There were mildly prominent smaller immediately adjacent lymph nodes. He was no right axillary or internal mammary adenopathy. An incidental lipoma was noted in the left lateral chest wall.  Her subsequent history is as detailed below.  INTERVAL HISTORY:  Robin Franklin returns today for follow-up of her estrogen receptor negative breast cancer. The interval history is generally stable. He saw Dr. Barnetta Chapel late July 2017 at the time of her left diagnostic mammography, which was benign.  REVIEW OF SYSTEMS: Robin Franklin has an intermittent sharp epigastric pain which sometimes radiates to the back. She describes herself as severely fatigued. Her vision is changing and she needs reading glasses. Sometimes at night she will have a stabbing pain in her feet and lower legs. This is very inconstant. If she does any exercise she pays for it the next day. She is seeing a [place] in [place] and she thinks that may help she has been told that she has scoliosis. She has occasional headaches. She is forgetful, anxious and depressed. She still has hot flashes. A detailed review of systems today was otherwise stable  PAST MEDICAL HISTORY: Past Medical History:  Diagnosis Date  . Anxiety   . Arthritis   . Breast cancer (HCC)   . Family history of breast cancer   . Headache(784.0)   . Hypothyroidism   . Joint pain   . S/P radiation therapy 05/17/2015 through 06/24/2015    Left breast 5040 cGy in 28 sessions; left supraclavicular/axillary region 5040 cGy in 28 sessions     PAST  SURGICAL HISTORY: Past Surgical History:  Procedure Laterality Date  . BREAST REDUCTION SURGERY    . CERVICAL CONE BIOPSY    . COLONOSCOPY  11/06/2011   Procedure:  COLONOSCOPY;  Surgeon: Dorothyann Peng, MD;  [facility];  Service: Endoscopy;  Laterality: N/A;  11:30 AM  . cyst removed from neck    . STERIOD INJECTION      FAMILY HISTORY Family History  Problem Relation Age of Onset  . Colon cancer Neg Hx   . Breast cancer Mother 5  . Heart attack Father   . Heart attack Maternal Uncle   . Cancer Maternal Uncle     NOS  . Prostate cancer Maternal Uncle   . Breast cancer Cousin     dx in her 82s  the patient's father died from a myocardial infarction at the age of 58. [name] died from breast cancer at the age of 83. It was diagnosed at age 29. The patient has 2 brothers, one sister. There is no other history of breast or ovarian cancer in the family to her knowledge.  GYNECOLOGIC HISTORY:  Patient's last menstrual period was 07/20/2013 (approximate). Menarche age 70, first live birth age 66. The patient is GX P2. She used oral contraceptives for a few months remotely with no side effects. She stopped having periods in September 2014. She did not take hormone replacement  SOCIAL HISTORY:  Robin Franklin worked as [name] for [employer] but she has not worked since her diagnosis of cancer. She has applied for disability but in denied twice, and is currently appealing.Marland Kitchen Her husband Robin Franklin is a Merchant navy officer with [employer]. At home it's just the 2 of them, with no pets. Bill's children from an earlier marriage are Robin Franklin. Robin Franklin, who lives in [place] and is a Public relations account executive; and Robin Franklin, who lives in [place] and is a Biochemist, clinical. The patient has 4 grandchildren +2 by adoption. She attends a [employer] locally    ADVANCED DIRECTIVES: Not in place   HEALTH MAINTENANCE: Social History  Substance Use Topics  . Smoking status: Never Smoker  . Smokeless tobacco: Not on file  . Alcohol use No     Colonoscopy: 2013/ [facility]  PAP: February 2015  Bone density: Never  Lipid  panel:  Allergies  Allergen Reactions  . Other Rash    Unknown Flu Vaccine from 2015 caused rash    Current Outpatient Prescriptions  Medication Sig Dispense Refill  . ALPRAZolam (XANAX) 0.5 MG tablet Take 0.5 mg by mouth daily as needed for anxiety. For anxiety    . Cyanocobalamin (VITAMIN B 12 PO) Take 1 tablet by mouth daily.    Marland Kitchen gabapentin (NEURONTIN) 100 MG capsule TAKE 1 CAPSULE (100 MG TOTAL) BY MOUTH 2 (TWO) TIMES DAILY. 60 capsule 1  . levothyroxine (SYNTHROID, LEVOTHROID) 75 MCG tablet Take 75 mcg by mouth daily before breakfast.    . lidocaine (LIDODERM) 5 % Place 1 patch onto the skin daily. Remove & Discard patch within 12 hours or as directed by MD 30 patch 0  . Melatonin 3 MG TABS Take 3 mg by mouth at bedtime as needed (Sleep).    . Multiple Vitamin (MULTIVITAMIN WITH MINERALS) TABS tablet Take 1 tablet by mouth daily.    . naproxen sodium (ANAPROX) 220 MG tablet Take 220 mg by mouth 2 (two) times daily with a meal.    . Omega-3 Fatty Acids (FISH OIL) 1000 MG CAPS  Take 1 capsule by mouth daily.    Marland Kitchen omeprazole (PRILOSEC) 40 MG capsule Take 1 capsule (40 mg total) by mouth daily. 90 capsule 4  . Probiotic Product (PROBIOTIC DAILY PO) Take by mouth.    . Selenium 100 MCG CAPS Take 1 capsule by mouth daily.    . vitamin C (ASCORBIC ACID) 500 MG tablet Take 500 mg by mouth daily.     No current facility-administered medications for this visit.     OBJECTIVE: Middle-aged African-American woman  Vitals:   10/01/16 1157  BP: 110/69  Pulse: 60  Resp: 18  Temp: 98.2 F (36.8 C)     Body mass index is 27.71 kg/m.    ECOG FS:1 - Symptomatic but completely ambulatory   Sclerae unicteric, pupils round and equal Oropharynx clear and moist-- no thrush or other lesions No cervical or supraclavicular adenopathy Lungs no rales or rhonchi Heart regular rate and rhythm Abd soft, nontender, positive bowel sounds MSK no focal spinal tenderness, no upper extremity  lymphedema Neuro: nonfocal, well oriented, appropriate affect Breasts: The right breast is benign. The left breast is status post lumpectomy and radiation. Left axilla is benign.   LAB RESULTS:  CMP     Component Value Date/Time   NA 142 12/01/2015 1349   K 4.5 12/01/2015 1349   CL 103 01/13/2014 1928   CO2 26 12/01/2015 1349   GLUCOSE 85 12/01/2015 1349   BUN 18.2 12/01/2015 1349   CREATININE 1.0 12/01/2015 1349   CALCIUM 9.6 12/01/2015 1349   PROT 7.6 12/01/2015 1349   ALBUMIN 4.5 12/01/2015 1349   AST 14 12/01/2015 1349   ALT 11 12/01/2015 1349   ALKPHOS 68 12/01/2015 1349   BILITOT 0.32 12/01/2015 1349   GFRNONAA >60 12/23/2010 1229   GFRAA  12/23/2010 1229    >60        The eGFR has been calculated using the MDRD equation. This calculation has not been validated in all clinical situations. eGFR's persistently <60 mL/min signify possible Chronic Kidney Disease.    INo results found for: SPEP, UPEP  Lab Results  Component Value Date   WBC 4.0 12/01/2015   NEUTROABS 2.3 12/01/2015   HGB 13.8 12/01/2015   HCT 41.4 12/01/2015   MCV 83.0 12/01/2015   PLT 207 12/01/2015      Chemistry      Component Value Date/Time   NA 142 12/01/2015 1349   K 4.5 12/01/2015 1349   CL 103 01/13/2014 1928   CO2 26 12/01/2015 1349   BUN 18.2 12/01/2015 1349   CREATININE 1.0 12/01/2015 1349      Component Value Date/Time   CALCIUM 9.6 12/01/2015 1349   ALKPHOS 68 12/01/2015 1349   AST 14 12/01/2015 1349   ALT 11 12/01/2015 1349   BILITOT 0.32 12/01/2015 1349       No results found for: LABCA2  No components found for: LABCA125  No results for input(s): INR in the last 168 hours.  Urinalysis    Component Value Date/Time   COLORURINE YELLOW 01/13/2014 1924   APPEARANCEUR CLEAR 01/13/2014 1924   LABSPEC >1.030 (H) 01/13/2014 1924   PHURINE 5.5 01/13/2014 1924   GLUCOSEU NEGATIVE 01/13/2014 1924   HGBUR NEGATIVE 01/13/2014 1924   BILIRUBINUR NEGATIVE  01/13/2014 1924   KETONESUR NEGATIVE 01/13/2014 1924   PROTEINUR NEGATIVE 01/13/2014 1924   UROBILINOGEN 0.2 01/13/2014 1924   NITRITE NEGATIVE 01/13/2014 1924   LEUKOCYTESUR NEGATIVE 01/13/2014 1924    STUDIES:  CLINICAL DATA:  Chest pressure for 1 week. History of left-sided breast carcinoma  EXAM: CHEST  2 VIEW  COMPARISON:  January 13, 2014  FINDINGS: Lungs are clear. Heart size and pulmonary vascularity are normal. No adenopathy. No pneumothorax. No bone lesions.  IMPRESSION: No edema or consolidation.   Electronically Signed   By: Lowella Grip III M.D.   On: 10/01/2016 09:25     ASSESSMENT: 55 y.o. Robin Franklin woman status post biopsy 11/15/2014 of a 4.6 cm left axillary tail mass showing a poorly differentiated carcinoma, triple negative, with an MIB-1 of 90%.  (1) s/p left axillary lymph node dissection under Dr.Howard-McNatt 12/23/2014 with 1 of 14 lymph nodes positive; TX N1, stage II; repeat prognostic panel again triple negative  (2) adjuvant chemotherapy started 01/17/2015, consisting of cyclophosphamide and doxorubicin 4 given in dose dense fashion, completed 02/28/2015, followed by paclitaxel and carboplatin given weekly with 12 cycles planned, but stopped after only 3 cycles because of progressive neuropathy, last dose 03/28/2015  (3) radiation 05/17/2015 through 06/24/2015:Left breast 5040 cGy in 28 sessions; left supraclavicular/axillary region 5040 cGy in 28 sessions  (4) genetics testing November 29 2015 through the Breast/Ovarian gene panel offered by [facility] found no deleterious mutations in ATM, BARD1, BRCA1, BRCA2, BRIP1, CDH1, CHEK2, EPCAM, FANCC, MLH1, MSH2, MSH6, NBN, PALB2, PMS2, PTEN, RAD51C, RAD51D, TP53, and XRCC2  PLAN: Sativa is now nearly 2 years out from definitive surgery for her breast cancer with no evidence of disease recurrence. This is favorable.  The chest pain that she experiences I think is going to  proved to be reflux. I have written her for omeprazole to take nightly for the next month. If that takes care of that problem then she will have confirmation that that is the right diagnosis. It certainly does not track like heart problems.  She has limited range of motion of the left upper extremity. She never did receive physical therapy for that. I have gone ahead and placed a referral for her.  She will be due for mammography this December. She is very reluctant to have it because it uncomfortable and because the mammogram did not show her cancer originally. We discussed this extensively and she understands we really do not have another test that has been shown to save lives in terms of breast cancer screening. I strongly recommended that she go ahead and have it done.  Today's labs are pending but she did ask that we add some iron studies and we were able to her, and 8 that.  She is planning to see Dr. Alvan Dame in about 6 months. If she sees me in 12 months we can continue to "tag team was "her in that fashion until she completes her 5 years of planned follow-up   Chauncey Cruel, MD   10/01/2016 12:53 PM  Medical Oncology and Hematology [facility] [place], [place] [address] Tel. [phone]    Fax. [phone]

## 2016-10-02 ENCOUNTER — Ambulatory Visit: Payer: 59 | Attending: Oncology | Admitting: Physical Therapy

## 2016-10-02 DIAGNOSIS — G8929 Other chronic pain: Secondary | ICD-10-CM | POA: Insufficient documentation

## 2016-10-02 DIAGNOSIS — M25612 Stiffness of left shoulder, not elsewhere classified: Secondary | ICD-10-CM

## 2016-10-02 DIAGNOSIS — M25512 Pain in left shoulder: Secondary | ICD-10-CM | POA: Insufficient documentation

## 2016-10-02 LAB — VITAMIN D 25 HYDROXY (VIT D DEFICIENCY, FRACTURES): Vitamin D, 25-Hydroxy: 39.4 ng/mL (ref 30.0–100.0)

## 2016-10-02 NOTE — Progress Notes (Signed)
Cardiology Office Note   Date:  10/03/2016   ID:  Robin Franklin, DOB February 07, 1961, MRN [id number]  PCP:  Glo Herring., MD  Cardiologist:   Jenkins Rouge, MD   No chief complaint on file.     History of Present Illness: Robin Franklin is a 55 y.o. female who presents for atypical chest pain. She is being Rx for hypothyroidism, anxiety and chronic back pain Also history of left sided breast cancer   Seen 09/29/16 gradual onset SSCP moderate intensity no radiation intermittent mostly at rest No alleviating or exacerbating factors  Labs:  TC 197 HDL 77 LDL 100 Tri 100   Pain is very atypical sharp can last a long time radiates to back. Not always exertional mostly at rest and with change in position After she gets chest pains her back can start hurting Chronic back pain after "laser spine surgery " years ago   Denies history of Lupus, connective tissue disease or RA.  CXR 10/01/16 NAD normal heart size and normal mediastinum   Past Medical History:  Diagnosis Date  . Anxiety   . Arthritis   . Breast cancer ([place])   . Family history of breast cancer   . Headache(784.0)   . Hypothyroidism   . Joint pain   . S/P radiation therapy 05/17/2015 through 06/24/2015    Left breast 5040 cGy in 28 sessions; left supraclavicular/axillary region 5040 cGy in 28 sessions     Past Surgical History:  Procedure Laterality Date  . BREAST REDUCTION SURGERY    . CERVICAL CONE BIOPSY    . COLONOSCOPY  11/06/2011   Procedure: COLONOSCOPY;  Surgeon: Dorothyann Peng, MD;  [facility];  Service: Endoscopy;  Laterality: N/A;  11:30 AM  . cyst removed from neck    . STERIOD INJECTION       Current Outpatient Prescriptions  Medication Sig Dispense Refill  . ALPRAZolam (XANAX) 0.5 MG tablet Take 0.5 mg by mouth daily as needed for anxiety. For anxiety    . Cyanocobalamin (VITAMIN B 12 PO) Take 1 tablet by mouth  daily.    Marland Kitchen gabapentin (NEURONTIN) 100 MG capsule TAKE 1 CAPSULE (100 MG TOTAL) BY MOUTH 2 (TWO) TIMES DAILY. 60 capsule 1  . levothyroxine (SYNTHROID, LEVOTHROID) 75 MCG tablet Take 75 mcg by mouth daily before breakfast.    . lidocaine (LIDODERM) 5 % Place 1 patch onto the skin daily. Remove & Discard patch within 12 hours or as directed by MD 30 patch 0  . Melatonin 3 MG TABS Take 3 mg by mouth at bedtime as needed (Sleep).    . Multiple Vitamin (MULTIVITAMIN WITH MINERALS) TABS tablet Take 1 tablet by mouth daily.    . naproxen sodium (ANAPROX) 220 MG tablet Take 220 mg by mouth 2 (two) times daily with a meal.    . Omega-3 Fatty Acids (FISH OIL) 1000 MG CAPS Take 1 capsule by mouth daily.    Marland Kitchen omeprazole (PRILOSEC) 40 MG capsule Take 1 capsule (40 mg total) by mouth daily. 90 capsule 4  . oxybutynin (DITROPAN-XL) 10 MG 24 hr tablet     . Probiotic Product (PROBIOTIC DAILY PO) Take by mouth.    . Selenium 100 MCG CAPS Take 1 capsule by mouth daily.    . vitamin C (ASCORBIC ACID) 500 MG tablet Take 500 mg by mouth daily.     No current facility-administered medications for this visit.     Allergies:   Other  Social History:  The patient  reports that she has never smoked. She has never used smokeless tobacco. She reports that she does not drink alcohol or use drugs.   Family History:  The patient's family history includes Breast cancer in her cousin; Breast cancer (age of onset: 68) in her mother; Cancer in her maternal uncle; Heart attack in her father and maternal uncle; Prostate cancer in her maternal uncle.    ROS:  Please see the history of present illness.   Otherwise, review of systems are positive for none.   All other systems are reviewed and negative.    PHYSICAL EXAM: VS:  BP 104/64   Pulse 68   Ht 5' 7"  (1.702 m)   Wt 79.8 kg (176 lb)   LMP 07/20/2013 (Approximate) Comment: As told to Technologist  SpO2 97%   BMI 27.57 kg/m  , BMI Body mass index is 27.57  kg/m. Affect appropriate Healthy:  appears stated age 66: normal Neck supple with no adenopathy JVP normal no bruits no thyromegaly Lungs clear with no wheezing and good diaphragmatic motion Heart:  S1/S2 no murmur, no rub, gallop or click PMI normal Abdomen: benighn, BS positve, no tenderness, no AAA no bruit.  No HSM or HJR Distal pulses intact with no bruits No edema Neuro non-focal Skin warm and dry No muscular weakness    EKG:   Fusco's office poor quality NSR normal with artifact    Recent Labs: 10/01/2016: ALT 11; BUN 13.3; Creatinine 0.9; HGB 13.3; Platelets 216; Potassium 4.0; Sodium 144; TSH 0.880    Lipid Panel No results found for: CHOL, TRIG, HDL, CHOLHDL, VLDL, LDLCALC, LDLDIRECT    Wt Readings from Last 3 Encounters:  10/03/16 79.8 kg (176 lb)  10/01/16 80.2 kg (176 lb 14.4 oz)  01/02/16 78.7 kg (173 lb 6.4 oz)      Other studies Reviewed: Additional studies/ records that were reviewed today include: Notes Dr Gerarda Fraction CXR ECG labs and Epic notes .    ASSESSMENT AND PLAN:  1. Chest pain: atypical she indicated ECG ok at Dr Fusco's office she cannot walk on treadmill due to back issues so will Order lexiscan myovue anyway.  Will order RF, ESR and ANA as arthritis screen 2. Breast Cancer:  F/u oncology may be related to pains port a cath removed  3. Back pain:  Chronic post surgery NSAI's as needed f/u primar y    Current medicines are reviewed at length with the patient today.  The patient does not have concerns regarding medicines.  The following changes have been made:  no change  Labs/ tests ordered today include: Lexiscan Myovue  ESR, RF ANA  No orders of the defined types were placed in this encounter.    Disposition:   FU with PRN      Signed, Jenkins Rouge, MD  10/03/2016 2:16 PM    [facility] [place], [place], [place]  [address] Phone: [phone]; Fax: [phone]

## 2016-10-02 NOTE — Therapy (Signed)
[place], [place], [address] Phone: [phone]   Fax:  [phone]  Physical Therapy Evaluation  Patient Details  Name: Robin Franklin MRN: [id number] Date of Birth: Jan 25, 1961 Referring Provider: Dr. Jana Hakim   Encounter Date: 10/02/2016      PT End of Session - 10/02/16 1829    Visit Number 1   Number of Visits 9   Date for PT Re-Evaluation 11/06/16   PT Start Time 1430   PT Stop Time 1515   PT Time Calculation (min) 45 min   Activity Tolerance Patient limited by pain   Behavior During Therapy [facility] for tasks assessed/performed      Past Medical History:  Diagnosis Date  . Anxiety   . Arthritis   . Breast cancer ([place])   . Family history of breast cancer   . Headache(784.0)   . Hypothyroidism   . Joint pain   . S/P radiation therapy 05/17/2015 through 06/24/2015    Left breast 5040 cGy in 28 sessions; left supraclavicular/axillary region 5040 cGy in 28 sessions     Past Surgical History:  Procedure Laterality Date  . BREAST REDUCTION SURGERY    . CERVICAL CONE BIOPSY    . COLONOSCOPY  11/06/2011   Procedure: COLONOSCOPY;  Surgeon: Dorothyann Peng, MD;  [facility];  Service: Endoscopy;  Laterality: N/A;  11:30 AM  . cyst removed from neck    . STERIOD INJECTION      There were no vitals filed for this visit.       Subjective Assessment - 10/02/16 1438    Subjective Pt states she has so many problems.  Dr. Gerarda Fraction asked her to have a funcitonal capacity evaluation for her back pain because she is out of work.  she is referred to [place] by Dr. Jana Hakim to help her with her left shoulder pain. She also had treament from a chiropractor in [facility] who took xrays and said she has a scoliosis  she also has heavianess in her chest and is going to the heart doctor tomorrow     Pertinent History back surgery in  2013. left breast cancer end of 2015, surgery on Feb3 for 14 lymph nodes removed.  Did not have lumpectomy because they couldn't find the the tumor in the breast but she had a 'little knot under my arm"  She had 6 weeks of radiation. and chemo that had to be stropped becuase of neuropathy that comes and goes. She has stabbing pains in lower legs.  bilateral breast reduction in 2001   Patient Stated Goals to be able to move her left arm without pain, and to get her arm behind her back    Currently in Pain? No/denies  only has the pain when she goes to move it.    Aggravating Factors  moving her arm to reach for household items or performing dressing and self care             [facility] PT Assessment - 10/02/16 0001      Assessment   Medical Diagnosis left breast cancer    Referring Provider Dr. Jana Hakim    Onset Date/Surgical Date 10/19/14   Hand Dominance Right     Precautions   Precautions Other (comment)   Precaution Comments previous cancer treatment      Restrictions   Weight Bearing Restrictions No     Balance Screen   Has the patient fallen in the past 6  months No   Has the patient had a decrease in activity level because of a fear of falling?  No   Is the patient reluctant to leave their home because of a fear of falling?  No     Home Ecologist residence   Living Arrangements Spouse/significant other   Available Help at Discharge Available PRN/intermittently   Type of [place] to enter   Entrance Stairs-Number of Steps 1     Prior Function   Level of Independence Independent with basic ADLs  slowly    Vocation Unemployed  not since 2015, applied for disabliity    Leisure doesn't know what she can or can't do, after she walks she is not able to do much for the next few days      Cognition   Overall Cognitive Status Within Functional Limits for tasks assessed     Observation/Other Assessments   Observations pt has  well healed scar at left anterior axilla with some fullness above and below it. There is decreased tissue excursion around scar    Quick DASH  70.45  did not rate one item      Sensation   Light Touch Not tested     Posture/Postural Control   Posture/Postural Control Postural limitations   Postural Limitations Forward head     AROM   Overall AROM Comments symmetrical scapular excursion, but appears to have muscle deconditioning    Right Shoulder Flexion 144 Degrees   Right Shoulder ABduction 157 Degrees   Right Shoulder Internal Rotation 45 Degrees   Right Shoulder External Rotation 90 Degrees   Left Shoulder Flexion 130 Degrees   Left Shoulder ABduction 85 Degrees  very painful    Left Shoulder Internal Rotation 45 Degrees   Left Shoulder External Rotation 80 Degrees     Strength   Right Shoulder Flexion 4-/5   Right Shoulder ABduction 4-/5   Left Shoulder Flexion 3-/5   Left Shoulder ABduction 2+/5  limited by pain      Palpation   Palpation comment fullness at left axilla, but not tender to palpation            LYMPHEDEMA/ONCOLOGY QUESTIONNAIRE - 10/02/16 1507      Type   Cancer Type left breast cancer     Surgeries   Axillary Lymph Node Dissection Date 12/22/14   Number Lymph Nodes Removed 14     Treatment   Past Chemotherapy Treatment Yes   Past Radiation Treatment Yes     What other symptoms do you have   Are you Having Heaviness or Tightness No   Are you having Pain Yes   Are you having pitting edema No   Is it Hard or Difficult finding clothes that fit No   Do you have infections No   Is there Decreased scar mobility Yes   Stemmer Sign No     Right Upper Extremity Lymphedema   10 cm Proximal to Olecranon Process 30 cm   Olecranon Process 25 cm   15 cm Proximal to Ulnar Styloid Process 26.5 cm   Just Proximal to Ulnar Styloid Process 15.5 cm   Across Hand at [employer] 19.5 cm   At [place] of 2nd Digit 5.5 cm     Left Upper Extremity  Lymphedema   10 cm Proximal to Olecranon Process 30 cm   Olecranon Process 25 cm   15 cm Proximal to Ulnar Styloid Process  26.7 cm   Just Proximal to Ulnar Styloid Process 15.5 cm   Across Hand at [employer] 19.5 cm   At [place] of 2nd Digit 5.5 cm           Katina Dung - 10/02/16 0001    Open a tight or new jar Unable   Do heavy household chores (wash walls, wash floors) Unable   Carry a shopping bag or briefcase Severe difficulty   Wash your back Unable   Use a knife to cut food --  did not rate   Recreational activities in which you take some force or impact through your arm, shoulder, or hand (golf, hammering, tennis) Unable   During the past week, to what extent has your arm, shoulder or hand problem interfered with your normal social activities with family, friends, neighbors, or groups? Quite a bit   During the past week, to what extent has your arm, shoulder or hand problem limited your work or other regular daily activities Quite a bit   Arm, shoulder, or hand pain. Severe   Tingling (pins and needles) in your arm, shoulder, or hand Moderate   Difficulty Sleeping Moderate difficulty   DASH Score 70.45 %                             Long Term Clinic Goals - 10/02/16 1839      CC Long Term Goal  #1   Title Pt will verbalize independence in lymphedema risk precautions    Time 4   Period Weeks   Status New     CC Long Term Goal  #2   Title Pt will have 145 degrees of left shouldre flexion so that she can more easily reach items in her kitchen    Baseline 130 on 10/02/2016   Time 4   Period Weeks   Status New     CC Long Term Goal  #3   Title Pt will have 120 degrees of left shoulde abduction so that she can more easily take care of personal ADL's    Baseline 85 on 10/02/2016   Time 4   Period Weeks   Status New     CC Long Term Goal  #4   Title Pt will report decrease in left shoulder pain by 50% so she can be more independent at home    Time 4     CC Long Term Goal  #5   Title Pt will be independent in a home exercise program so she can continue progress on her own.    Time 4   Period Weeks   Status New            Plan - 10/02/16 1830    Clinical Impression Statement 55 yo female with multiple problems comes to our clinic for treatment of left shoulder pain,weakness and decreased range of motion.  This eval is of moderate complexity becuase of the comorbidities and evolving status of pain in that it is limiting some of her activities at home. She has 70.45 score on the DASH even though she only completed 10 items  While she does not have lymphedema per circumferential measurements, she does have fullness in axilla and decreased scar mobility from axillary node dissection    Rehab Potential Fair   Clinical Impairments Affecting Rehab Potential previous chemo and radiation, history of back problems and decreased activity tolerance    PT Frequency 2x /  week   PT Duration 4 weeks   PT Treatment/Interventions ADLs/Self Care Home Management;Patient/family education;Manual techniques;Therapeutic exercise;Neuromuscular re-education;Scar mobilization;Passive range of motion   PT Next Visit Plan meeks decompression exercise, PROM, teach dowel rod exercise ,enroll in ABC class    Recommended Other Services ABC class    Consulted and Agree with Plan of Care Patient      Patient will benefit from skilled therapeutic intervention in order to improve the following deficits and impairments:  Decreased knowledge of precautions, Decreased scar mobility, Pain, Decreased strength, Impaired UE functional use, Increased fascial restricitons, Decreased range of motion, Postural dysfunction  Visit Diagnosis: Chronic left shoulder pain  Stiffness of left shoulder, not elsewhere classified     Problem List Patient Active Problem List   Diagnosis Date Noted  . Genetic testing 11/29/2015  . Family history of breast cancer   .  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 Chemotherapy-induced nausea 03/21/2015  . Back pain 03/21/2015  . Constipation 03/21/2015  . Watery eyes 02/21/2015  . Mucositis due to chemotherapy 02/21/2015  . 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 Owens Shark PT  Norwood Levo 10/02/2016, 6:45 PM  [place], [place], [address] Phone: [phone]   Fax:  [phone]  Name: Robin Franklin MRN: [id number] Date of Birth: August 19, 1961

## 2016-10-03 ENCOUNTER — Ambulatory Visit (INDEPENDENT_AMBULATORY_CARE_PROVIDER_SITE_OTHER): Payer: 59 | Admitting: Cardiovascular Disease

## 2016-10-03 ENCOUNTER — Encounter: Payer: Self-pay | Admitting: Cardiovascular Disease

## 2016-10-03 VITALS — BP 104/64 | HR 68 | Ht 67.0 in | Wt 176.0 lb

## 2016-10-03 DIAGNOSIS — R079 Chest pain, unspecified: Secondary | ICD-10-CM

## 2016-10-03 NOTE — Patient Instructions (Signed)
Medication Instructions:  Your physician recommends that you continue on your current medications as directed. Please refer to the Current Medication list given to you today.   Labwork: Your physician recommends that you return for lab work in: TODAY    Testing/Procedures: Your physician has requested that you have a lexiscan myoview. For further information please visit [email or web address]. Please follow instruction sheet, as given.    Follow-Up: Your physician recommends that you schedule a follow-up appointment in: AS NEEDED    Any Other Special Instructions Will Be Listed Below (If Applicable).     If you need a refill on your cardiac medications before your next appointment, please call your pharmacy.

## 2016-10-04 ENCOUNTER — Telehealth: Payer: Self-pay | Admitting: Physician Assistant

## 2016-10-04 ENCOUNTER — Ambulatory Visit: Payer: 59 | Admitting: Physical Therapy

## 2016-10-04 ENCOUNTER — Encounter: Payer: Self-pay | Admitting: Physical Therapy

## 2016-10-04 DIAGNOSIS — M25512 Pain in left shoulder: Secondary | ICD-10-CM | POA: Diagnosis not present

## 2016-10-04 DIAGNOSIS — M25612 Stiffness of left shoulder, not elsewhere classified: Secondary | ICD-10-CM

## 2016-10-04 DIAGNOSIS — G8929 Other chronic pain: Secondary | ICD-10-CM

## 2016-10-04 LAB — ANTI-NUCLEAR AB-TITER (ANA TITER)

## 2016-10-04 LAB — ANA: Anti Nuclear Antibody(ANA): POSITIVE — AB

## 2016-10-04 LAB — SEDIMENTATION RATE: Sed Rate: 1 mm/hr (ref 0–30)

## 2016-10-04 LAB — RHEUMATOID FACTOR

## 2016-10-04 NOTE — Therapy (Signed)
[place], [place], [address] Phone: [phone]   Fax:  [phone]  Physical Therapy Treatment  Patient Details  Name: Robin Franklin MRN: [id number] Date of Birth: Feb 14, 1961 Referring Provider: Dr. Jana Hakim   Encounter Date: 10/04/2016      PT End of Session - 10/04/16 1207    Visit Number 2   Number of Visits 9   Date for PT Re-Evaluation 11/06/16   PT Start Time 1018   PT Stop Time 1104   PT Time Calculation (min) 46 min   Activity Tolerance Patient tolerated treatment well   Behavior During Therapy [facility] for tasks assessed/performed      Past Medical History:  Diagnosis Date  . Anxiety   . Arthritis   . Breast cancer ([place])   . Family history of breast cancer   . Headache(784.0)   . Hypothyroidism   . Joint pain   . S/P radiation therapy 05/17/2015 through 06/24/2015    Left breast 5040 cGy in 28 sessions; left supraclavicular/axillary region 5040 cGy in 28 sessions     Past Surgical History:  Procedure Laterality Date  . BREAST REDUCTION SURGERY    . CERVICAL CONE BIOPSY    . COLONOSCOPY  11/06/2011   Procedure: COLONOSCOPY;  Surgeon: Dorothyann Peng, MD;  [facility];  Service: Endoscopy;  Laterality: N/A;  11:30 AM  . cyst removed from neck    . STERIOD INJECTION      There were no vitals filed for this visit.      Subjective Assessment - 10/04/16 1021    Subjective I am hanging in there. I still have chest pains. Pt states she went to a heart doctor and had blood work done yesterday. EKG was normal. Chest x rays were normal.    Pertinent History back surgery in 2013. left breast cancer end of 2015, surgery on Feb3 for 14 lymph nodes removed.  Did not have lumpectomy because they couldn't find the the tumor in the breast but she had a 'little knot under my arm"  She had 6 weeks of radiation. and  chemo that had to be stropped becuase of neuropathy that comes and goes. She has stabbing pains in lower legs.  bilateral breast reduction in 2001   Patient Stated Goals to be able to move her left arm without pain, and to get her arm behind her back    Currently in Pain? Yes   Pain Score 6    Pain Location Chest   Pain Descriptors / Indicators Constant;Heaviness   Pain Type Acute pain   Pain Onset In the past 7 days   Pain Frequency Constant   Aggravating Factors  coughing, sneezing, supine to sit   Pain Relieving Factors nothing                         [facility] Adult PT Treatment/Exercise - 10/04/16 0001      Shoulder Exercises: Supine   Flexion AAROM;Both;10 reps  with 30 sec holds   ABduction AAROM;Left;10 reps  with 30 sec holds   Other Supine Exercises Meeks decompression exercises all x 5 reps with 3 sec holds                        [facility] Goals - 10/02/16 1839      CC Long Term Goal  #1   Title Pt will verbalize  independence in lymphedema risk precautions    Time 4   Period Weeks   Status New     CC Long Term Goal  #2   Title Pt will have 145 degrees of left shouldre flexion so that she can more easily reach items in her kitchen    Baseline 130 on 10/02/2016   Time 4   Period Weeks   Status New     CC Long Term Goal  #3   Title Pt will have 120 degrees of left shoulde abduction so that she can more easily take care of personal ADL's    Baseline 85 on 10/02/2016   Time 4   Period Weeks   Status New     CC Long Term Goal  #4   Title Pt will report decrease in left shoulder pain by 50% so she can be more independent at home   Time 4     CC Long Term Goal  #5   Title Pt will be independent in a home exercise program so she can continue progress on her own.    Time 4   Period Weeks   Status New            Plan - 10/04/16 1207    Clinical Impression Statement Patient was educated in [facility] decompression exercises  to decrease back pain and to improve posture. She was also given AAROM with dowel to improve left shoulder pain. PROM performed to left shoulder and increased muscle tightness could be palpated as pt's arm was moved passively muscles could be felt tensing up. Most of pt's discomfort comes from the joint instead of across her chest or in her axilla. Pt educated about participating in ABC class and reports intrest if she can come on a day she already has an appointmnet.    Rehab Potential Fair   Clinical Impairments Affecting Rehab Potential previous chemo and radiation, history of back problems and decreased activity tolerance    PT Frequency 2x / week   PT Duration 4 weeks   PT Treatment/Interventions ADLs/Self Care Home Management;Patient/family education;Manual techniques;Therapeutic exercise;Neuromuscular re-education;Scar mobilization;Passive range of motion   PT Next Visit Plan assess for indep with meeks decompression execises dowel rod exercise, ask pt if she enrolled in ABC class, continue PROM to left shoulder    Recommended Other Services ABC class   Consulted and Agree with Plan of Care Patient      Patient will benefit from skilled therapeutic intervention in order to improve the following deficits and impairments:  Decreased knowledge of precautions, Decreased scar mobility, Pain, Decreased strength, Impaired UE functional use, Increased fascial restricitons, Decreased range of motion, Postural dysfunction  Visit Diagnosis: Chronic left shoulder pain  Stiffness of left shoulder, not elsewhere classified     Problem List Patient Active Problem List   Diagnosis Date Noted  . Genetic testing 11/29/2015  . Family history of breast cancer   . 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 Chemotherapy-induced nausea 03/21/2015  . Back pain 03/21/2015  . Constipation 03/21/2015  . Watery eyes 02/21/2015  . Mucositis due to chemotherapy 02/21/2015  . Cancer of axillary  tail of left female breast ([place]) 11/28/2014    Alexia Freestone 10/04/2016, 12:12 PM  [place], [place], [address] Phone: [phone]   Fax:  [phone]  Name: JILLANA DIBERT MRN: [id number] Date of Birth: [birth year]  Allyson Sabal, PT 10/04/16 12:12 PM

## 2016-10-04 NOTE — Patient Instructions (Signed)
Shoulder: Flexion (Supine)    With hands shoulder width apart, slowly lower dowel to floor behind head. Do not let elbows bend. Keep back flat. Hold _30___ seconds. Repeat _10___ times. Do __2__ sessions per day. CAUTION: Stretch slowly and gently.  Copyright  VHI. All rights reserved.  Shoulder: Abduction (Supine)    With left arm flat on floor, hold dowel in palm. Slowly move arm up to side of head by pushing with opposite arm. Do not let elbow bend. Hold __30__ seconds. Repeat _10___ times. Do __2__ sessions per day. CAUTION: Stretch slowly and gently.  Copyright  VHI. All rights reserved.

## 2016-10-04 NOTE — Telephone Encounter (Signed)
Lab results / tg  °

## 2016-10-05 NOTE — Telephone Encounter (Signed)
Results given,forwarded to pcp

## 2016-10-08 ENCOUNTER — Encounter: Payer: 59 | Admitting: Physical Therapy

## 2016-10-09 ENCOUNTER — Ambulatory Visit: Payer: 59 | Admitting: Physical Therapy

## 2016-10-09 DIAGNOSIS — M25512 Pain in left shoulder: Secondary | ICD-10-CM | POA: Diagnosis not present

## 2016-10-09 DIAGNOSIS — G8929 Other chronic pain: Secondary | ICD-10-CM

## 2016-10-09 DIAGNOSIS — M25612 Stiffness of left shoulder, not elsewhere classified: Secondary | ICD-10-CM

## 2016-10-09 NOTE — Therapy (Signed)
[place], [place], [address] Phone: [phone]   Fax:  [phone]  Physical Therapy Treatment  Patient Details  Name: Robin Franklin MRN: [id number] Date of Birth: [birth year] Referring Provider: Dr. Jana Hakim   Encounter Date: 10/09/2016      PT End of Session - 10/09/16 1256    Visit Number 3   Number of Visits 9   Date for PT Re-Evaluation 11/06/16   PT Start Time 1104   PT Stop Time 1148   PT Time Calculation (min) 44 min   Activity Tolerance Patient tolerated treatment well   Behavior During Therapy [facility] for tasks assessed/performed      Past Medical History:  Diagnosis Date  . Anxiety   . Arthritis   . Breast cancer ([place])   . Family history of breast cancer   . Headache(784.0)   . Hypothyroidism   . Joint pain   . S/P radiation therapy 05/17/2015 through 06/24/2015    Left breast 5040 cGy in 28 sessions; left supraclavicular/axillary region 5040 cGy in 28 sessions     Past Surgical History:  Procedure Laterality Date  . BREAST REDUCTION SURGERY    . CERVICAL CONE BIOPSY    . COLONOSCOPY  11/06/2011   Procedure: COLONOSCOPY;  Surgeon: Dorothyann Peng, MD;  [facility];  Service: Endoscopy;  Laterality: N/A;  11:30 AM  . cyst removed from neck    . STERIOD INJECTION      There were no vitals filed for this visit.      Subjective Assessment - 10/09/16 1112    Subjective "Its not my heart"  Pt continues to have the chest pain. She's decided to follow up with chiropractor in [facility] who will be able to address her problems as a whole so she will not follow  up with PT here.    Pertinent History back surgery in 2013. left breast cancer end of 2015, surgery on Feb3 for 14 lymph nodes removed.  Did not have lumpectomy because they couldn't find the the tumor in the breast but she had a 'little knot  under my arm"  She had 6 weeks of radiation. and chemo that had to be stropped becuase of neuropathy that comes and goes. She has stabbing pains in lower legs.  bilateral breast reduction in 2001   Patient Stated Goals to be able to move her left arm without pain, and to get her arm behind her back    Currently in Pain? Yes   Pain Score 4    Pain Location Chest   Pain Orientation Left   Pain Descriptors / Indicators Constant;Heaviness   Pain Type Acute pain                         [facility] Adult PT Treatment/Exercise - 10/09/16 0001      Self-Care   Self-Care Other Self-Care Comments   Other Self-Care Comments  instruction in lymphedema risk reduction practices and issued handout from ABC class as pt does not get to [facility] often She will contact [employer] about a class one compressin sleeve and gauntlet.      Shoulder Exercises: Supine   External Rotation AAROM;Left;10 reps  stretching with dowel rod    Other Supine Exercises Meeks decompression exercises all x 5 reps with 3 sec holds   Other Supine Exercises supine, sitting and standing dowel rod exercise for flexion and abduction.  Manual Therapy   Joint Mobilization to left shoulder for joint distractions, gentle posterior and inferior glides                 PT Education - 10/09/16 1256    Education provided Yes   Education Details shoulder dowel rod exercise, lymphedma risk reduction practices    Person(s) Educated Patient   Methods Explanation;Demonstration;Handout   Comprehension Verbalized understanding;Returned demonstration                Long Term Clinic Goals - 10/09/16 1714      CC Long Term Goal  #1   Title Pt will verbalize independence in lymphedema risk precautions    Status Achieved     CC Long Term Goal  #2   Title Pt will have 145 degrees of left shouldre flexion so that she can more easily reach items in her kitchen    Status Not Met     CC Long Term  Goal  #3   Title Pt will have 120 degrees of left shoulde abduction so that she can more easily take care of personal ADL's    Status Not Met     CC Long Term Goal  #4   Title Pt will report decrease in left shoulder pain by 50% so she can be more independent at home   Status Not Met     CC Long Term Goal  #5   Title Pt will be independent in a home exercise program so she can continue progress on her own.    Status Not Met            Plan - 10/09/16 1257    Clinical Impression Statement Pt is still limited by pain in chest, but feels hopeful that the chiropractor in [facility] will be able to help her with her spine pain and then she will be able to do more with her shoulder.  She wants to get stronger and return the the gym as she has in the past, but is currently limited by chest pain.  She knows to progress upper extremity strengtheing slowly to provide a decrease in lymphedema risk and knows she can come back to [place] for further instruction to decrease lymphedema risk in the furture.    Rehab Potential Fair   Clinical Impairments Affecting Rehab Potential previous chemo and radiation, history of back problems and decreased activity tolerance    PT Frequency 2x / week   PT Next Visit Plan discharge this visit per patient request    Consulted and Agree with Plan of Care Patient      Patient will benefit from skilled therapeutic intervention in order to improve the following deficits and impairments:  Decreased knowledge of precautions, Decreased scar mobility, Pain, Decreased strength, Impaired UE functional use, Increased fascial restricitons, Decreased range of motion, Postural dysfunction  Visit Diagnosis: Chronic left shoulder pain  Stiffness of left shoulder, not elsewhere classified     Problem List Patient Active Problem List   Diagnosis Date Noted  . Genetic testing 11/29/2015  . Family history of breast cancer   . Chemotherapy-induced neuropathy ([place])  04/04/2015  . Chemotherapy-induced nausea 03/21/2015  . Back pain 03/21/2015  . Constipation 03/21/2015  . Watery eyes 02/21/2015  . Mucositis due to chemotherapy 02/21/2015  . 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 She wants to receive treatment for this at a chiropractor in [facility]    Remaining deficits: Pain, decreased range of motion and strength    Education / Equipment: Home exercise, lymphedema risk reduction  Plan: Patient agrees to discharge.  Patient goals were partially met. Patient is being discharged due to the patient's request.  ?????    Donato Heinz. Owens Shark PT  Norwood Levo 10/09/2016, 5:15 PM  [place], [place], [address] Phone: [phone]   Fax:  [phone]  Name: Robin Franklin MRN: [id number] Date of Birth: 1961/03/28

## 2016-10-15 ENCOUNTER — Telehealth: Payer: Self-pay | Admitting: Cardiovascular Disease

## 2016-10-15 NOTE — Telephone Encounter (Signed)
Spoke with Pt regarding lab results and informed her to make fu appt with PCP.

## 2016-10-15 NOTE — Telephone Encounter (Signed)
Please give pt a call concerning lab results [phone]

## 2016-10-16 ENCOUNTER — Encounter (HOSPITAL_COMMUNITY): Admission: RE | Admit: 2016-10-16 | Payer: 59 | Source: Ambulatory Visit

## 2016-10-16 ENCOUNTER — Encounter (HOSPITAL_COMMUNITY): Payer: 59

## 2016-10-16 ENCOUNTER — Inpatient Hospital Stay (HOSPITAL_COMMUNITY): Admission: RE | Admit: 2016-10-16 | Payer: 59 | Source: Ambulatory Visit

## 2016-11-28 DIAGNOSIS — E785 Hyperlipidemia, unspecified: Secondary | ICD-10-CM | POA: Diagnosis not present

## 2016-11-28 DIAGNOSIS — E039 Hypothyroidism, unspecified: Secondary | ICD-10-CM | POA: Diagnosis not present

## 2017-02-12 DIAGNOSIS — E063 Autoimmune thyroiditis: Secondary | ICD-10-CM | POA: Diagnosis not present

## 2017-02-12 DIAGNOSIS — M47814 Spondylosis without myelopathy or radiculopathy, thoracic region: Secondary | ICD-10-CM | POA: Diagnosis not present

## 2017-02-12 DIAGNOSIS — Z1389 Encounter for screening for other disorder: Secondary | ICD-10-CM | POA: Diagnosis not present

## 2017-02-20 ENCOUNTER — Encounter: Payer: Self-pay | Admitting: Neurology

## 2017-03-04 DIAGNOSIS — H16142 Punctate keratitis, left eye: Secondary | ICD-10-CM | POA: Diagnosis not present

## 2017-03-12 ENCOUNTER — Telehealth: Payer: Self-pay

## 2017-03-12 ENCOUNTER — Other Ambulatory Visit: Payer: Self-pay

## 2017-03-12 DIAGNOSIS — C50612 Malignant neoplasm of axillary tail of left female breast: Secondary | ICD-10-CM

## 2017-03-12 NOTE — Telephone Encounter (Signed)
Spoke with pt by phone over concern for her upcoming mammogram.  Pt states she does not want to have mammogram due to the fact that she has already gone through radiation therapy and she wants to limit her exposure to any future radiation.  Pt request to have a breast ultrasound instead.  Orders placed to [facility] and faxed.  Pt states she will call them to schedule an appointment.

## 2017-05-01 ENCOUNTER — Encounter: Payer: Self-pay | Admitting: Neurology

## 2017-05-01 ENCOUNTER — Ambulatory Visit (INDEPENDENT_AMBULATORY_CARE_PROVIDER_SITE_OTHER): Payer: 59 | Admitting: Neurology

## 2017-05-01 VITALS — BP 108/60 | HR 62 | Ht 67.0 in | Wt 171.0 lb

## 2017-05-01 DIAGNOSIS — R251 Tremor, unspecified: Secondary | ICD-10-CM

## 2017-05-01 DIAGNOSIS — R202 Paresthesia of skin: Secondary | ICD-10-CM | POA: Diagnosis not present

## 2017-05-01 NOTE — Patient Instructions (Signed)
1.  We will check a nerve study to evaluate for nerve problems. 2.  We will check bloodwork for B12, B6, SPEP/IFE 3.  Follow up after testing.

## 2017-05-01 NOTE — Addendum Note (Signed)
Addended byMargarette Asal L on: 05/01/2017 11:09 AM   Modules accepted: Orders

## 2017-05-01 NOTE — Progress Notes (Signed)
NEUROLOGY CONSULTATION NOTE  Robin Franklin MRN: [id number] DOB: 01/13/1961  Referring provider: Dr. Gerarda Fraction Primary care provider: Dr. Gerarda Fraction  Reason for consult:  Paresthesias, evaluate for multiple sclerosis.  HISTORY OF PRESENT ILLNESS: .Robin Franklin is a 56 year old right-handed female with Hashimoto's disease, chronic anxiety, chronic back pain and history of left sided breast cancer who presents for evaluation of multiple sclerosis.  History supplemented by PCP and oncology notes.  She has chronic low back pain which started around 2005, in which she developed numbness and radicular pain down the legs.  She subsequently underwent surgery in 2013.  She continued to have numbness and tingling in the fingers and legs.  She followed up with neurosurgery.  MRI of lumbar spine from 09/03/13 was personally reviewed and reveals degenerative disc disease at L2-3, L3-4 and L4-5, with disc protrusions abutting the right L5 nerve root and right L3 nerve root, but nothing requiring surgical intervention.  She was diagnosed and treated for breast cancer in 2016 with cyclophosphamide and doxorubicin for 4 cycles, followed by paclitaxel and carboplatin, which was stopped early due to progressive neuropathy.  She continues to have the numbness and tingling.  It is unchanged.  She describes it as intermittent tingling sensation.  She notes it in the feet and shins when laying still in bed, which requires her to move them for relief.  When she walks, she sometimes notes tingling in different toes.  Sometimes, she has numbness in the fingers.  While symptoms are intermittent, she always has tingling in the right thumb.  While symptoms are bilateral, the right seems worse than the left side.  She denies radicular pain down the arms or legs.  She denies weakness of the extremities.  She denies significant headache.  She had tingling in her eye.  She had an eye exam and was diagnosed with inflammation requiring prednisone  drops.  She was also endorsing generalized aches.  ANA was positive but Sed rate and RF were unremarkable.  She saw rheumatology and did not seem to have an autoimmune disease.  She also reports that her right hand sometimes has a tremor, which makes it difficult for her to write.  Sometimes when she is laying in bed, she reports feeling like she is "drowning in my blood".  She does endorse anxiety.  10/03/16 LABS:  CBC with WBC 4.2, HGB 13.3, HCT 41.7 and PLT 216; CMP with Na 144, K 4, Cl 108, CO2 26, glucose 106, BUN 13.3, Cr 0.9, total bili 0.41, ALP 62, AST 14 and ALT 11; TSH 0.880; vitamin D 25-hydroxy 39.4; ANA positive 1:40 nucleolar pattern, Sed rate 1, and RF negative.  PAST MEDICAL HISTORY: Past Medical History:  Diagnosis Date  . Anxiety   . Arthritis   . Breast cancer ([place])   . Family history of breast cancer   . Headache(784.0)   . Hypothyroidism   . Joint pain   . S/P radiation therapy 05/17/2015 through 06/24/2015    Left breast 5040 cGy in 28 sessions; left supraclavicular/axillary region 5040 cGy in 28 sessions     PAST SURGICAL HISTORY: Past Surgical History:  Procedure Laterality Date  . BREAST REDUCTION SURGERY    . CERVICAL CONE BIOPSY    . COLONOSCOPY  11/06/2011   Procedure: COLONOSCOPY;  Surgeon: Dorothyann Peng, MD;  Location: [facility];  Service: Endoscopy;  Laterality: N/A;  11:30 AM  . cyst removed from neck    . STERIOD INJECTION  MEDICATIONS: Current Outpatient Prescriptions on File Prior to Visit  Medication Sig Dispense Refill  . ALPRAZolam (XANAX) 0.5 MG tablet Take 0.5 mg by mouth daily as needed for anxiety. For anxiety    . Cyanocobalamin (VITAMIN B 12 PO) Take 1 tablet by mouth daily.    Marland Kitchen gabapentin (NEURONTIN) 100 MG capsule TAKE 1 CAPSULE (100 MG TOTAL) BY MOUTH 2 (TWO) TIMES DAILY. 60 capsule 1  . levothyroxine (SYNTHROID, LEVOTHROID) 75 MCG tablet Take 75 mcg  by mouth daily before breakfast.    . lidocaine (LIDODERM) 5 % Place 1 patch onto the skin daily. Remove & Discard patch within 12 hours or as directed by MD 30 patch 0  . Melatonin 3 MG TABS Take 3 mg by mouth at bedtime as needed (Sleep).    . naproxen sodium (ANAPROX) 220 MG tablet Take 220 mg by mouth 2 (two) times daily with a meal.    . omeprazole (PRILOSEC) 40 MG capsule Take 1 capsule (40 mg total) by mouth daily. 90 capsule 4  . oxybutynin (DITROPAN-XL) 10 MG 24 hr tablet     . Probiotic Product (PROBIOTIC DAILY PO) Take by mouth.    . Selenium 100 MCG CAPS Take 1 capsule by mouth daily.    . vitamin C (ASCORBIC ACID) 500 MG tablet Take 500 mg by mouth daily.     No current facility-administered medications on file prior to visit.     ALLERGIES: Allergies  Allergen Reactions  . Other Rash    Unknown Flu Vaccine from 2015 caused rash    FAMILY HISTORY: Family History  Problem Relation Age of Onset  . Breast cancer Mother 85  . Heart attack Father   . Heart attack Maternal Uncle   . Cancer Maternal Uncle        NOS  . Prostate cancer Maternal Uncle   . Breast cancer Cousin        dx in her 32s  . Colon cancer Neg Hx     SOCIAL HISTORY: Social History   Social History  . Marital status: Married    Spouse name: N/A  . Number of children: 2  . Years of education: N/A   Occupational History  . Not on file.   Social History Main Topics  . Smoking status: Never Smoker  . Smokeless tobacco: Never Used  . Alcohol use No  . Drug use: No  . Sexual activity: Yes   Other Topics Concern  . Not on file   Social History Narrative  . No narrative on file    REVIEW OF SYSTEMS: Constitutional: No fevers, chills, or sweats, no generalized fatigue, change in appetite Eyes: No visual changes, double vision, eye pain Ear, nose and throat: No hearing loss, ear pain, nasal congestion, sore throat Cardiovascular: No chest pain, palpitations Respiratory:  No shortness of  breath at rest or with exertion, wheezes GastrointestinaI: No nausea, vomiting, diarrhea, abdominal pain, fecal incontinence Genitourinary:  No dysuria, urinary retention or frequency Musculoskeletal:  No neck pain, back pain Integumentary: No rash, pruritus, skin lesions Neurological: as above Psychiatric: No depression, insomnia, anxiety Endocrine: No palpitations, fatigue, diaphoresis, mood swings, change in appetite, change in weight, increased thirst Hematologic/Lymphatic:  No purpura, petechiae. Allergic/Immunologic: no itchy/runny eyes, nasal congestion, recent allergic reactions, rashes  PHYSICAL EXAM: Vitals:   05/01/17 0947  BP: 108/60  Pulse: 62   General: No acute distress.  Patient appears well-groomed.  Head:  Normocephalic/atraumatic Eyes:  fundi examined but not visualized Neck:  supple, no paraspinal tenderness, full range of motion Back: No paraspinal tenderness Heart: regular rate and rhythm Lungs: Clear to auscultation bilaterally. Vascular: No carotid bruits. Neurological Exam: Mental status: alert and oriented to person, place, and time, recent and remote memory intact, fund of knowledge intact, attention and concentration intact, speech fluent and not dysarthric, language intact. Cranial nerves: CN I: not tested CN II: pupils equal, round and reactive to light, visual fields intact CN III, IV, VI:  full range of motion, no nystagmus, no ptosis CN V: facial sensation intact CN VII: upper and lower face symmetric CN VIII: hearing intact CN IX, X: gag intact, uvula midline CN XI: sternocleidomastoid and trapezius muscles intact CN XII: tongue midline Bulk & Tone: normal, no fasciculations. Motor:  5/5 throughout  Sensation:  Pinprick and vibration sensation intact. Deep Tendon Reflexes:  2+ throughout, toes downgoing.  Finger to nose testing:  Without dysmetria.  Heel to shin:  Without dysmetria.  Gait:  Normal station and stride.  Able to turn and tandem  walk. Romberg negative.  IMPRESSION: 1.  Paresthesias. She does not exhibit any focal or lateralizing symptoms to suggest CNS (intracranial or spinal cord) lesion.  I would first evaluate for neuropathy. 2.  She reports occasional tremor.  Not appreciated on exam today.  Likely essential tremor.  PLAN: 1.  We will check NCV-EMG 2.  We will check B6, B12, SPEP/IFE 3. Follow up after testing.  Further testing pending results of NCV-EMG  Thank you for allowing me to take part in the care of this patient.  Metta Clines, DO  CC:  Redmond School, MD

## 2017-05-03 ENCOUNTER — Other Ambulatory Visit: Payer: Self-pay | Admitting: *Deleted

## 2017-05-03 DIAGNOSIS — R200 Anesthesia of skin: Secondary | ICD-10-CM

## 2017-05-06 DIAGNOSIS — R202 Paresthesia of skin: Secondary | ICD-10-CM | POA: Diagnosis not present

## 2017-05-06 DIAGNOSIS — R251 Tremor, unspecified: Secondary | ICD-10-CM | POA: Diagnosis not present

## 2017-05-07 LAB — VITAMIN B12: Vitamin B-12: 1710 pg/mL — ABNORMAL HIGH (ref 200–1100)

## 2017-05-08 LAB — IMMUNOFIXATION ELECTROPHORESIS
IGG (IMMUNOGLOBIN G), SERUM: 1198 mg/dL (ref 694–1618)
IgA: 98 mg/dL (ref 81–463)
IgM, Serum: 37 mg/dL — ABNORMAL LOW (ref 48–271)

## 2017-05-08 LAB — PROTEIN ELECTROPHORESIS, SERUM
ALPHA-1-GLOBULIN: 0.2 g/dL (ref 0.2–0.3)
Albumin ELP: 4.2 g/dL (ref 3.8–4.8)
Alpha-2-Globulin: 0.5 g/dL (ref 0.5–0.9)
BETA 2: 0.3 g/dL (ref 0.2–0.5)
Beta Globulin: 0.4 g/dL (ref 0.4–0.6)
GAMMA GLOBULIN: 1.1 g/dL (ref 0.8–1.7)
TOTAL PROTEIN, SERUM ELECTROPHOR: 6.7 g/dL (ref 6.1–8.1)

## 2017-05-10 LAB — VITAMIN B6: VITAMIN B6: 37.2 ng/mL — AB (ref 2.1–21.7)

## 2017-05-20 ENCOUNTER — Other Ambulatory Visit: Payer: Self-pay | Admitting: Oncology

## 2017-06-07 ENCOUNTER — Telehealth: Payer: Self-pay

## 2017-06-07 NOTE — Telephone Encounter (Signed)
Pt is seeing some puffy knotty areas on her left arm. It is on the inside area above the elbow, no redness. It is tender. Noticed in the last week. Not outside in the heat a lot. Is not elevating. Is not needing pain meds for it.

## 2017-06-07 NOTE — Telephone Encounter (Signed)
Pt states she has been playing the tambourine with hitting it with her left hand an increased amount this past week.  Instructed her to monitor for increased redness, swelling or pain and to go to ER or urgent care if this happens. Otherwise keep it elevated and use nsaids prn, and play tambourine less.

## 2017-06-17 ENCOUNTER — Telehealth: Payer: Self-pay | Admitting: Emergency Medicine

## 2017-06-18 ENCOUNTER — Encounter: Payer: 59 | Admitting: Neurology

## 2017-06-24 NOTE — Telephone Encounter (Signed)
spoke w/ pt regarding left arm. pt states swelling is gone and she feels she does not need to be seen at this time. pt asked if she could have a ultrasound instead of mamogram. ex to pt dr Jana Hakim feels strongly pt is to have a mamogram first.

## 2017-07-10 ENCOUNTER — Telehealth: Payer: Self-pay | Admitting: Oncology

## 2017-07-10 NOTE — Telephone Encounter (Signed)
sw pt to confirm r/s 11/13 appt to 11/21 at 0930 due to provider being out of office

## 2017-07-29 DIAGNOSIS — M545 Low back pain: Secondary | ICD-10-CM | POA: Diagnosis not present

## 2017-07-29 DIAGNOSIS — M62830 Muscle spasm of back: Secondary | ICD-10-CM | POA: Diagnosis not present

## 2017-07-29 DIAGNOSIS — G629 Polyneuropathy, unspecified: Secondary | ICD-10-CM | POA: Diagnosis not present

## 2017-07-29 DIAGNOSIS — M47816 Spondylosis without myelopathy or radiculopathy, lumbar region: Secondary | ICD-10-CM | POA: Diagnosis not present

## 2017-08-15 ENCOUNTER — Ambulatory Visit (INDEPENDENT_AMBULATORY_CARE_PROVIDER_SITE_OTHER): Payer: 59 | Admitting: Neurology

## 2017-08-15 DIAGNOSIS — R200 Anesthesia of skin: Secondary | ICD-10-CM | POA: Diagnosis not present

## 2017-08-15 NOTE — Procedures (Signed)
[facility]  [address]  [place], [place] [address] Tel: [phone] Fax:  [phone] Test Date:  08/15/2017  Patient: Robin Franklin DOB: [birth year] Physician: Narda Amber, DO  Sex: Female Height: 5\' 7"  Ref Phys: Metta Clines, D.O.  ID#: [id number] Temp: 32.7C Technician:    Patient Complaints: This is a 56 year old female referred for evaluation of generalized paresthesias of the arms and legs.  NCV & EMG Findings: Extensive electrodiagnostic testing of the right upper and lower extremity shows:  1. All sensory responses including the right median, ulnar, mixed palmer, sural, and superficial peroneal nerves are within normal limits. 2. All motor responses including the right median, ulnar, peroneal, and tibial nerves are within normal limits. 3. Right tibial H reflex study is within normal limits. 4. There is no evidence of active or chronic motor axon loss changes affecting any of the tested muscles. Motor unit configuration and recruitment pattern is within normal limits.  Impression: This is a normal study.  In particular, there is no evidence of a generalized sensorimotor polyneuropathy, cervical/lumbosacral radiculopathy, or carpal tunnel syndrome affecting the right side.   ___________________________ Narda Amber, DO    Nerve Conduction Studies Anti Sensory Summary Table   Site NR Peak (ms) Norm Peak (ms) P-T Amp (V) Norm P-T Amp  Right Median Anti Sensory (2nd Digit)  32.7C  Wrist    3.2 <3.6 36.8 >15  Right Sup Peroneal Anti Sensory (Ant Lat Mall)  32.7C  12 cm    2.4 <4.6 10.6 >4  Right Sural Anti Sensory (Lat Mall)  32.7C  Calf    3.3 <4.6 18.7 >4  Right Ulnar Anti Sensory (5th Digit)  32.7C  Wrist    3.1 <3.1 45.6 >10   Motor Summary Table   Site NR Onset (ms) Norm Onset (ms) O-P Amp (mV) Norm O-P Amp Site1 Site2 Delta-0 (ms) Dist (cm) Vel (m/s) Norm Vel (m/s)  Right Median Motor (Abd Poll Brev)  32.7C  Wrist    3.2 <4.0 10.6 >6  Elbow Wrist 4.4 26.5 60 >50  Elbow    7.6  9.8         Right Peroneal Motor (Ext Dig Brev)  32.7C  Ankle    3.6 <6.0 7.7 >2.5 B Fib Ankle 7.8 37.0 47 >40  B Fib    11.4  7.6  Poplt B Fib 1.3 9.0 69 >40  Poplt    12.7  7.3         Right Tibial Motor (Abd Hall Brev)  32.7C  Ankle    3.8 <6.0 7.3 >4 Knee Ankle 8.2 40.0 49 >40  Knee    12.0  6.4         Right Ulnar Motor (Abd Dig Minimi)  32.7C  Wrist    2.7 <3.1 13.0 >7 B Elbow Wrist 3.9 25.0 64 >50  B Elbow    6.6  12.8  A Elbow B Elbow 1.7 10.0 59 >50  A Elbow    8.3  12.6          Comparison Summary Table   Site NR Peak (ms) Norm Peak (ms) P-T Amp (V) Site1 Site2 Delta-P (ms) Norm Delta (ms)  Right Median/Ulnar Palm Comparison (Wrist - 8cm)  32.7C  Median Palm    1.5 <2.2 43.9 Median Palm Ulnar Palm 0.3   Ulnar Palm    1.8 <2.2 11.1       H Reflex Studies   NR H-Lat (ms) Lat Norm (  ms) L-R H-Lat (ms)  Right Tibial (Gastroc)  32.7C     30.75 <35    EMG   Side Muscle Ins Act Fibs Psw Fasc Number Recrt Dur Dur. Amp Amp. Poly Poly. Comment  Right AntTibialis Nml Nml Nml Nml Nml Nml Nml Nml Nml Nml Nml Nml N/A  Right Gastroc Nml Nml Nml Nml Nml Nml Nml Nml Nml Nml Nml Nml N/A  Right Flex Dig Long Nml Nml Nml Nml Nml Nml Nml Nml Nml Nml Nml Nml N/A  Right RectFemoris Nml Nml Nml Nml Nml Nml Nml Nml Nml Nml Nml Nml N/A  Right GluteusMed Nml Nml Nml Nml Nml Nml Nml Nml Nml Nml Nml Nml N/A  Right BicepsFemS Nml Nml Nml Nml Nml Nml Nml Nml Nml Nml Nml Nml N/A  Right 1stDorInt Nml Nml Nml Nml Nml Nml Nml Nml Nml Nml Nml Nml N/A  Right PronatorTeres Nml Nml Nml Nml Nml Nml Nml Nml Nml Nml Nml Nml N/A  Right Biceps Nml Nml Nml Nml Nml Nml Nml Nml Nml Nml Nml Nml N/A  Right Triceps Nml Nml Nml Nml Nml Nml Nml Nml Nml Nml Nml Nml N/A  Right Deltoid Nml Nml Nml Nml Nml Nml Nml Nml Nml Nml Nml Nml N/A      Waveforms:

## 2017-09-03 DIAGNOSIS — Z01419 Encounter for gynecological examination (general) (routine) without abnormal findings: Secondary | ICD-10-CM | POA: Diagnosis not present

## 2017-09-06 ENCOUNTER — Telehealth: Payer: Self-pay

## 2017-09-06 NOTE — Telephone Encounter (Signed)
Called and spoke with Pt, advsd her of EMG results

## 2017-09-06 NOTE — Telephone Encounter (Signed)
-----   Message from Pieter Partridge, DO sent at 09/06/2017  6:53 AM EDT ----- All testing looks okay

## 2017-10-01 ENCOUNTER — Ambulatory Visit: Payer: 59 | Admitting: Oncology

## 2017-10-01 ENCOUNTER — Other Ambulatory Visit: Payer: 59

## 2017-10-03 NOTE — Progress Notes (Signed)
Robin Franklin  Telephone:([phone]) [phone] Fax:(336) [phone]    ID: Robin Franklin DOB: 1961/05/25  MR#: [id number]  BMW#:[id number]  Patient Care Team: Redmond School, MD as PCP - General (Internal Medicine) Robin Klein, MD as Consulting Physician (General Surgery) Robin Franklin, Robin Dad, MD as Consulting Physician (Oncology) Robin Koh, MD as Consulting Physician (Radiation Oncology) Robin Rea, MD as Consulting Physician (Obstetrics and Gynecology) Robin Pies, MD as Consulting Physician (Neurosurgery) Robin Ok, MD as Referring Physician (Surgery) Robin Cheese, NP as Nurse Practitioner (Hematology and Oncology) Robin Franklin, DC as Physician Assistant (Chiropractic Medicine) OTHER MD:  CHIEF COMPLAINT: Triple negative breast cancer   CURRENT TREATMENT: Observation  BREAST CANCER HISTORY: From the original intake note:  Robin Franklin herself noted a mass in her left axilla sometime in late November or she brought it to Dr. Nolon Franklin attention and on 11/15/2014 she had a left mammogram and left ultrasound at the breast Center. The breast density was category 8. The left breast was entirely unremarkable, with no masses, distortion, or calcifications. In the left axilla there was a mass which was palpable and moderately firm. By ultrasound it measured 3.2 cm. There were no other axillary masses of concern.  Biopsy of the left axillary mass the same day showed ([id number]) a high-grade carcinoma which was estrogen and progesterone receptor negative, HER-2 negative, with a signals ratio of 1.53 and the number per cell of 2.60, with an MIB-1 of 95%. It was also gross cystic disease fluid protein negative, and negative for cytokeratin 20 and TTF-1. It was positive for cytokeratin 7. This was felt to be consistent with a breast primary, but gynecologic and other malignancies were also in the differential.  In general 04/08/2015 the patient had a diagnostic  right mammogram, which was negative. On the same day she had bilateral breast MRI. There were no masses or abnormal enhancement in either breast. In the left axilla there was a round enhancing mass measuring 4.6 cm. There were mildly prominent smaller immediately adjacent lymph nodes. He was no right axillary or internal mammary adenopathy. An incidental lipoma was noted in the left lateral chest wall.  Her subsequent history is as detailed below.  INTERVAL HISTORY:  Robin Franklin returns today for follow-up of her triple negative breast cancer.  She continues under observation.  She recently saw Dr. Stann Franklin who performed his annual GYN exam on 09/03/2017.  The patient's most recent visit with Dr. Alvan Franklin was 12/19/2016. She has not had a recent mammogram, but reports she will get one before the end of the year.  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 When she experiences tingling in her feet, she has to stop what she is doing because she is unable to walk. She hasn't been exercising too much, because of her chronic back pain. She does walk on a treadmill. She is experiencing headaches and has tried taking Excedrin with mild relief. Pt was eating better and reports she doesn't have any plans for thanksgiving because she wants to continue eating well. She denies unusual headaches, visual changes, nausea, vomiting, or dizziness. There has been no unusual cough, phlegm production, or pleurisy. This been no change in bowel or bladder habits. She denies unexplained fatigue or unexplained weight loss, bleeding, rash, or fever. A detailed review of systems was otherwise entirely stable.   PAST MEDICAL HISTORY: Past Medical History:  Diagnosis Date  . Anxiety   . Arthritis   . Breast cancer ([place])   .  Family history of breast cancer   . Headache(784.0)   . Hypothyroidism   . Joint pain   . S/P radiation therapy 05/17/2015 through  06/24/2015    Left breast 5040 cGy in 28 sessions; left supraclavicular/axillary region 5040 cGy in 28 sessions     PAST SURGICAL HISTORY: Past Surgical History:  Procedure Laterality Date  . BREAST REDUCTION SURGERY    . CERVICAL CONE BIOPSY    . COLONOSCOPY  11/06/2011   Procedure: COLONOSCOPY;  Surgeon: Dorothyann Peng, MD;  Location: [facility];  Service: Endoscopy;  Laterality: N/A;  11:30 AM  . cyst removed from neck    . STERIOD INJECTION      FAMILY HISTORY Family History  Problem Relation Age of Onset  . Breast cancer Mother 12  . Heart attack Father   . Heart attack Maternal Uncle   . Cancer Maternal Uncle        NOS  . Prostate cancer Maternal Uncle   . Breast cancer Cousin        dx in her 75s  . Colon cancer Neg Hx   the patient's father died from a myocardial infarction at the age of 51. [name] died from breast cancer at the age of 46. It was diagnosed at age 22. The patient has 2 brothers, one sister. There is no other history of breast or ovarian cancer in the family to her knowledge.  GYNECOLOGIC HISTORY:  Patient's last menstrual period was 08/09/2013. Menarche age 29, first live birth age 58. The patient is GX P2. She used oral contraceptives for a few months remotely with no side effects. She stopped having periods in September 2014. She did not take hormone replacement  SOCIAL HISTORY:  Robin Franklin worked as [name] for [employer] but she has not worked since her diagnosis of cancer. She has applied for disability but in denied twice, and is currently appealing.Marland Kitchen Her husband Robin Franklin is a Merchant navy officer with Robin Franklin. At home it's just the 2 of them, with no pets. Robin Franklin's children from an earlier marriage are Robin Franklin. Robin Franklin, who lives in [place] and is a Public relations account executive; and Robin Franklin, who lives in [place] and is a Biochemist, clinical. The patient has 4  grandchildren +2 by adoption. She attends a [employer] locally    ADVANCED DIRECTIVES: Not in place   HEALTH MAINTENANCE: Social History   Tobacco Use  . Smoking status: Never Smoker  . Smokeless tobacco: Never Used  Substance Use Topics  . Alcohol use: No  . Drug use: No     Colonoscopy: 2013/ [facility]  PAP: February 2015  Bone density: Never  Lipid panel:  Allergies  Allergen Reactions  . Other Rash    Unknown Flu Vaccine from 2015 caused rash    Current Outpatient Medications  Medication Sig Dispense Refill  . ALPRAZolam (XANAX) 0.5 MG tablet Take 0.5 mg by mouth daily as needed for anxiety. For anxiety    . BUSPIRONE HCL PO Take by mouth as needed.    . COD LIVER OIL PO Take by mouth daily.    . Cyanocobalamin (VITAMIN B 12 PO) Take 1 tablet by mouth daily.    . cyclobenzaprine (FLEXERIL) 10 MG tablet Take 10 mg by mouth as needed for muscle spasms.    Marland Kitchen gabapentin (NEURONTIN) 100 MG capsule TAKE 1 CAPSULE (100 MG TOTAL) BY MOUTH 2 (TWO) TIMES DAILY. 60 capsule 1  . levothyroxine (SYNTHROID, LEVOTHROID) 75 MCG  tablet Take 75 mcg by mouth daily before breakfast.    . lidocaine (LIDODERM) 5 % Place 1 patch onto the skin daily. Remove & Discard patch within 12 hours or as directed by MD 30 patch 0  . Melatonin 3 MG TABS Take 3 mg by mouth at bedtime as needed (Sleep).    . naproxen sodium (ANAPROX) 220 MG tablet Take 220 mg by mouth 2 (two) times daily with a meal.    . omeprazole (PRILOSEC) 40 MG capsule Take 1 capsule (40 mg total) by mouth daily. 90 capsule 4  . oxybutynin (DITROPAN-XL) 10 MG 24 hr tablet     . Probiotic Product (PROBIOTIC DAILY PO) Take by mouth.    . Selenium 100 MCG CAPS Take 1 capsule by mouth daily.    . vitamin C (ASCORBIC ACID) 500 MG tablet Take 500 mg by mouth daily.     No current facility-administered medications for this visit.     OBJECTIVE: Middle-aged African-American woman who appears stated age  56:   10/09/17 0950   BP: 103/77  Pulse: 60  Resp: 20  Temp: 98.1 F (36.7 C)  SpO2: 100%     Body mass index is 27.61 kg/m.    ECOG FS:1 - Symptomatic but completely ambulatory   Sclerae unicteric, EOMs intact Oropharynx clear and moist No cervical or supraclavicular adenopathy Lungs no rales or rhonchi Heart regular rate and rhythm Abd soft, nontender, positive bowel sounds MSK no focal spinal tenderness, no upper extremity lymphedema Neuro: nonfocal, well oriented, appropriate affect Breasts: The right breast is unremarkable.  The left breast is undergone lumpectomy followed by radiation, with no evidence of local recurrence.  There is tenderness in the inferior aspect of the breast to palpation, but again no erythema or swelling.  Both axillae are benign  LAB RESULTS:  CMP     Component Value Date/Time   NA 142 10/09/2017 0922   K 4.2 10/09/2017 0922   CL 103 01/13/2014 1928   CO2 27 10/09/2017 0922   GLUCOSE 90 10/09/2017 0922   BUN 13.5 10/09/2017 0922   CREATININE 0.9 10/09/2017 0922   CALCIUM 9.5 10/09/2017 0922   PROT 7.1 10/09/2017 0922   ALBUMIN 4.0 10/09/2017 0922   AST 16 10/09/2017 0922   ALT 15 10/09/2017 0922   ALKPHOS 53 10/09/2017 0922   BILITOT 0.54 10/09/2017 0922   GFRNONAA >60 12/23/2010 1229   GFRAA  12/23/2010 1229    >60        The eGFR has been calculated using the MDRD equation. This calculation has not been validated in all clinical situations. eGFR's persistently <60 mL/min signify possible Chronic Kidney Disease.    INo results found for: SPEP, UPEP  Lab Results  Component Value Date   WBC 4.6 10/09/2017   NEUTROABS 2.4 10/09/2017   HGB 12.6 10/09/2017   HCT 38.2 10/09/2017   MCV 84.3 10/09/2017   PLT 207 10/09/2017      Chemistry      Component Value Date/Time   NA 142 10/09/2017 0922   K 4.2 10/09/2017 0922   CL 103 01/13/2014 1928   CO2 27 10/09/2017 0922   BUN 13.5 10/09/2017 0922   CREATININE 0.9 10/09/2017 0922      Component  Value Date/Time   CALCIUM 9.5 10/09/2017 0922   ALKPHOS 53 10/09/2017 0922   AST 16 10/09/2017 0922   ALT 15 10/09/2017 0922   BILITOT 0.54 10/09/2017 0922       No  results found for: LABCA2  No components found for: LABCA125  No results for input(s): INR in the last 168 hours.  Urinalysis    Component Value Date/Time   COLORURINE YELLOW 01/13/2014 1924   APPEARANCEUR CLEAR 01/13/2014 1924   LABSPEC >1.030 (H) 01/13/2014 1924   PHURINE 5.5 01/13/2014 1924   GLUCOSEU NEGATIVE 01/13/2014 1924   HGBUR NEGATIVE 01/13/2014 1924   BILIRUBINUR NEGATIVE 01/13/2014 1924   KETONESUR NEGATIVE 01/13/2014 1924   PROTEINUR NEGATIVE 01/13/2014 1924   UROBILINOGEN 0.2 01/13/2014 1924   NITRITE NEGATIVE 01/13/2014 1924   LEUKOCYTESUR NEGATIVE 01/13/2014 1924    STUDIES: She is behind on her mammography.  This was discussed at length today  ASSESSMENT: 57 y.o. Robin Franklin woman status post excisional biopsy 11/15/2014 of a 4.6 cm left axillary tail mass showing a poorly differentiated carcinoma, triple negative, with an MIB-1 of 90%.  (1) s/p left axillary lymph node dissection under Dr.Howard-McNatt 12/23/2014 with 1 of 14 lymph nodes positive; TX N1, stage II; repeat prognostic panel again triple negative  (2) adjuvant chemotherapy started 01/17/2015, consisting of cyclophosphamide and doxorubicin 4 given in dose dense fashion, completed 02/28/2015, followed by paclitaxel and carboplatin given weekly with 12 cycles planned, but stopped after only 3 cycles because of progressive neuropathy, last dose 03/28/2015  (3) radiation 05/17/2015 through 06/24/2015:Left breast 5040 cGy in 28 sessions; left supraclavicular/axillary region 5040 cGy in 28 sessions  (4) genetics testing November 29 2015 through the Breast/Ovarian gene panel offered by [facility] found no deleterious mutations in ATM, BARD1, BRCA1, BRCA2, BRIP1, CDH1, CHEK2, EPCAM, FANCC, MLH1, MSH2, MSH6, NBN, PALB2, PMS2,  PTEN, RAD51C, RAD51D, TP53, and XRCC2  PLAN: Zaynab will soon be 3 years out from definitive surgery for her breast cancer with no evidence of disease recurrence.  This is very favorable.  She has multiple symptoms which were detailed above.  She went through a disability hearing recently and is hopeful that she will be able to obtain disability so she stresses less about the income question.  Hopefully she will be able to start some kind of exercise program at some point.  In the meantime she will return to the diet she had the summer, which was very favorable.  She requested thyroid test today.  We have added those.  They are not yet back of course.  She can access them through "my chart".  Otherwise I have set her up for mammography next week and return visit next year.  I asked her to have her mammogram next year before the visit but she says she is worried that mammography causes cancer and she does not want to have a mammogram every year we will continue to discuss this as I have urged her to continue yearly mammography is the best way to catch any future breast cancers early enough that she may be able to avoid future chemotherapy  She knows to call for any other issues that may develop before the next visit.  Robin Franklin, Robin Dad, MD  10/09/17 10:15 AM Medical Oncology and Hematology [facility] [address] [place], [place] [address] Tel. [phone]    Fax. [phone]  This document serves as a record of services personally performed by Chauncey Cruel, MD. It was created on his behalf by Margit Banda, a trained medical scribe. The creation of this record is based on the scribe's personal observations and the provider's statements to them.   I have reviewed the above documentation for accuracy and completeness, and  I agree with the above.

## 2017-10-09 ENCOUNTER — Ambulatory Visit (HOSPITAL_BASED_OUTPATIENT_CLINIC_OR_DEPARTMENT_OTHER): Payer: 59 | Admitting: Oncology

## 2017-10-09 ENCOUNTER — Other Ambulatory Visit (HOSPITAL_BASED_OUTPATIENT_CLINIC_OR_DEPARTMENT_OTHER): Payer: 59

## 2017-10-09 VITALS — BP 103/77 | HR 60 | Temp 98.1°F | Resp 20 | Ht 67.0 in | Wt 176.3 lb

## 2017-10-09 DIAGNOSIS — E039 Hypothyroidism, unspecified: Secondary | ICD-10-CM

## 2017-10-09 DIAGNOSIS — C50612 Malignant neoplasm of axillary tail of left female breast: Secondary | ICD-10-CM

## 2017-10-09 DIAGNOSIS — Z171 Estrogen receptor negative status [ER-]: Secondary | ICD-10-CM

## 2017-10-09 DIAGNOSIS — Z853 Personal history of malignant neoplasm of breast: Secondary | ICD-10-CM

## 2017-10-09 LAB — COMPREHENSIVE METABOLIC PANEL
ALT: 15 U/L (ref 0–55)
ANION GAP: 7 meq/L (ref 3–11)
AST: 16 U/L (ref 5–34)
Albumin: 4 g/dL (ref 3.5–5.0)
Alkaline Phosphatase: 53 U/L (ref 40–150)
BUN: 13.5 mg/dL (ref 7.0–26.0)
CALCIUM: 9.5 mg/dL (ref 8.4–10.4)
CO2: 27 mEq/L (ref 22–29)
CREATININE: 0.9 mg/dL (ref 0.6–1.1)
Chloride: 108 mEq/L (ref 98–109)
Glucose: 90 mg/dl (ref 70–140)
Potassium: 4.2 mEq/L (ref 3.5–5.1)
Sodium: 142 mEq/L (ref 136–145)
Total Bilirubin: 0.54 mg/dL (ref 0.20–1.20)
Total Protein: 7.1 g/dL (ref 6.4–8.3)

## 2017-10-09 LAB — CBC WITH DIFFERENTIAL/PLATELET
BASO%: 0.4 % (ref 0.0–2.0)
Basophils Absolute: 0 10*3/uL (ref 0.0–0.1)
EOS%: 1.3 % (ref 0.0–7.0)
Eosinophils Absolute: 0.1 10*3/uL (ref 0.0–0.5)
HEMATOCRIT: 38.2 % (ref 34.8–46.6)
HEMOGLOBIN: 12.6 g/dL (ref 11.6–15.9)
LYMPH#: 1.7 10*3/uL (ref 0.9–3.3)
LYMPH%: 36.2 % (ref 14.0–49.7)
MCH: 27.8 pg (ref 25.1–34.0)
MCHC: 33 g/dL (ref 31.5–36.0)
MCV: 84.3 fL (ref 79.5–101.0)
MONO#: 0.4 10*3/uL (ref 0.1–0.9)
MONO%: 9.6 % (ref 0.0–14.0)
NEUT%: 52.5 % (ref 38.4–76.8)
NEUTROS ABS: 2.4 10*3/uL (ref 1.5–6.5)
Platelets: 207 10*3/uL (ref 145–400)
RBC: 4.53 10*6/uL (ref 3.70–5.45)
RDW: 13.9 % (ref 11.2–14.5)
WBC: 4.6 10*3/uL (ref 3.9–10.3)

## 2017-10-23 ENCOUNTER — Telehealth: Payer: Self-pay | Admitting: Oncology

## 2017-10-23 NOTE — Telephone Encounter (Signed)
Left msg re Dec 2019 appt. Schedule mailed

## 2017-10-25 ENCOUNTER — Ambulatory Visit
Admission: RE | Admit: 2017-10-25 | Discharge: 2017-10-25 | Disposition: A | Discharge status: Home / Self Care | Payer: 59 | Source: Ambulatory Visit | Attending: Oncology | Admitting: Oncology

## 2017-10-25 DIAGNOSIS — C50612 Malignant neoplasm of axillary tail of left female breast: Secondary | ICD-10-CM

## 2017-10-25 DIAGNOSIS — Z171 Estrogen receptor negative status [ER-]: Secondary | ICD-10-CM

## 2017-10-25 DIAGNOSIS — E039 Hypothyroidism, unspecified: Secondary | ICD-10-CM

## 2017-10-25 DIAGNOSIS — R928 Other abnormal and inconclusive findings on diagnostic imaging of breast: Secondary | ICD-10-CM | POA: Diagnosis not present

## 2018-03-10 DIAGNOSIS — E039 Hypothyroidism, unspecified: Secondary | ICD-10-CM | POA: Diagnosis not present

## 2018-03-10 DIAGNOSIS — J312 Chronic pharyngitis: Secondary | ICD-10-CM | POA: Diagnosis not present

## 2018-03-10 DIAGNOSIS — E663 Overweight: Secondary | ICD-10-CM | POA: Diagnosis not present

## 2018-03-10 DIAGNOSIS — Z1389 Encounter for screening for other disorder: Secondary | ICD-10-CM | POA: Diagnosis not present

## 2018-03-10 DIAGNOSIS — R499 Unspecified voice and resonance disorder: Secondary | ICD-10-CM | POA: Diagnosis not present

## 2018-03-10 DIAGNOSIS — E559 Vitamin D deficiency, unspecified: Secondary | ICD-10-CM | POA: Diagnosis not present

## 2018-03-11 DIAGNOSIS — R490 Dysphonia: Secondary | ICD-10-CM | POA: Diagnosis not present

## 2018-03-11 DIAGNOSIS — K219 Gastro-esophageal reflux disease without esophagitis: Secondary | ICD-10-CM | POA: Diagnosis not present

## 2018-04-10 ENCOUNTER — Ambulatory Visit (INDEPENDENT_AMBULATORY_CARE_PROVIDER_SITE_OTHER): Payer: 59 | Admitting: Otolaryngology

## 2018-07-07 DIAGNOSIS — M545 Low back pain: Secondary | ICD-10-CM | POA: Diagnosis not present

## 2018-07-07 DIAGNOSIS — G64 Other disorders of peripheral nervous system: Secondary | ICD-10-CM | POA: Diagnosis not present

## 2018-07-07 DIAGNOSIS — R202 Paresthesia of skin: Secondary | ICD-10-CM | POA: Diagnosis not present

## 2018-07-22 DIAGNOSIS — G629 Polyneuropathy, unspecified: Secondary | ICD-10-CM | POA: Diagnosis not present

## 2018-10-10 ENCOUNTER — Ambulatory Visit (HOSPITAL_COMMUNITY)
Admission: RE | Admit: 2018-10-10 | Discharge: 2018-10-10 | Disposition: A | Discharge status: Home / Self Care | Payer: 59 | Source: Ambulatory Visit | Attending: Family Medicine | Admitting: Family Medicine

## 2018-10-10 ENCOUNTER — Other Ambulatory Visit (HOSPITAL_COMMUNITY): Payer: Self-pay | Admitting: Family Medicine

## 2018-10-10 DIAGNOSIS — E663 Overweight: Secondary | ICD-10-CM | POA: Diagnosis not present

## 2018-10-10 DIAGNOSIS — R0789 Other chest pain: Secondary | ICD-10-CM | POA: Insufficient documentation

## 2018-10-10 DIAGNOSIS — R079 Chest pain, unspecified: Secondary | ICD-10-CM | POA: Diagnosis not present

## 2018-10-10 DIAGNOSIS — Z6827 Body mass index (BMI) 27.0-27.9, adult: Secondary | ICD-10-CM | POA: Diagnosis not present

## 2018-10-26 NOTE — Progress Notes (Signed)
[name]  [phone] [phone]    ID: Robin Franklin DOB: 1961-09-21  MR#: [id number]  JGG#:[id number]  Patient Care Team: Redmond School, MD as PCP - General (Internal Medicine) Stark Klein, MD as Consulting Physician (General Surgery) Magrinat, Virgie Dad, MD as Consulting Physician (Oncology) Arloa Koh, MD as Consulting Physician (Radiation Oncology) Vania Rea, MD as Consulting Physician (Obstetrics and Gynecology) Newman Pies, MD as Consulting Physician (Neurosurgery) Angelina Ok, MD as Referring Physician (Surgery) Sylvan Cheese, NP as Nurse Practitioner (Hematology and Oncology) Gypsy Decant, DC as Physician Assistant (Chiropractic Medicine) OTHER MD:  CHIEF COMPLAINT: Triple negative breast cancer   CURRENT TREATMENT: Observation   BREAST CANCER HISTORY: From the original intake note:  Aly herself noted a mass in her left axilla sometime in late November or she brought it to Dr. Nolon Rod attention and on 11/15/2014 she had a left mammogram and left ultrasound at the [facility] Center. The breast density was category 8. The left breast was entirely unremarkable, with no masses, distortion, or calcifications. In the left axilla there was a mass which was palpable and moderately firm. By ultrasound it measured 3.2 cm. There were no other axillary masses of concern.  Biopsy of the left axillary mass the same day showed ([id number]) a high-grade carcinoma which was estrogen and progesterone receptor negative, HER-2 negative, with a signals ratio of 1.53 and the number per cell of 2.60, with an MIB-1 of 95%. It was also gross cystic disease fluid protein negative, and negative for cytokeratin 20 and TTF-1. It was positive for cytokeratin 7. This was felt to be consistent with a breast primary, but gynecologic and other malignancies were also in the differential.  In general 04/08/2015 the patient had a diagnostic  right mammogram, which was negative. On the same day she had bilateral breast MRI. There were no masses or abnormal enhancement in either breast. In the left axilla there was a round enhancing mass measuring 4.6 cm. There were mildly prominent smaller immediately adjacent lymph nodes. He was no right axillary or internal mammary adenopathy. An incidental lipoma was noted in the left lateral chest wall.  Her subsequent history is as detailed below.   INTERVAL HISTORY:  Robin Franklin returns today for follow-up of her triple negative breast cancer.   The patient continues under observation.  [name]'s last mammogram was competed at the [facility] on 10/25/2017. She is overdue for her annual screening.   Since her last visit, she underwent a chest DG on 10/10/2018 for chest pain, ordered by a doctor within the same practice as Dr. Gerarda Fraction, showing that the lungs are clear. The heart size and pulmonary vascularity are normal. There is no adenopathy. There is no pneumothorax. There are no bone lesions. She has been having inflammation in the chest for about three weeks. She also had been having issues with her lower back, for which she has had surgery for in the past.    REVIEW OF SYSTEMS: Robin Franklin is doing well overall. For the last month or so, she has made changes to her diet to help normalize her LDL levels. The patient denies unusual headaches, visual changes, nausea, vomiting, or dizziness. There has been no unusual cough, phlegm production, or pleurisy. This been no change in bowel or bladder habits. The patient denies unexplained fatigue or unexplained weight loss, bleeding, rash, or fever. A detailed review of systems was otherwise noncontributory.    PAST MEDICAL HISTORY: Past Medical History:  Diagnosis Date  .  Anxiety   . Arthritis   . Breast cancer ([place])   . Family history of breast cancer   . Headache(784.0)   . Hypothyroidism   . Joint pain   . S/P radiation therapy 05/17/2015 through  06/24/2015    Left breast 5040 cGy in 28 sessions; left supraclavicular/axillary region 5040 cGy in 28 sessions     PAST SURGICAL HISTORY: Past Surgical History:  Procedure Laterality Date  . BREAST REDUCTION SURGERY    . CERVICAL CONE BIOPSY    . COLONOSCOPY  11/06/2011   Procedure: COLONOSCOPY;  Surgeon: Dorothyann Peng, MD;  [facility];  Service: Endoscopy;  Laterality: N/A;  11:30 AM  . cyst removed from neck    . REDUCTION MAMMAPLASTY Bilateral 2001  . STERIOD INJECTION      FAMILY HISTORY Family History  Problem Relation Age of Onset  . Breast cancer Mother 65  . Heart attack Father   . Heart attack Maternal Uncle   . Cancer Maternal Uncle        NOS  . Prostate cancer Maternal Uncle   . Breast cancer Cousin        dx in her 34s  . Colon cancer Neg Hx    the patient's father died from a myocardial infarction at the age of 76. [name] died from breast cancer at the age of 63. It was diagnosed at age 63. The patient has 2 brothers, one sister. There is no other history of breast or ovarian cancer in the family to her knowledge.   GYNECOLOGIC HISTORY:  Patient's last menstrual period was 08/09/2013. Menarche age 57, first live birth age 22. The patient is GX P2. She used oral contraceptives for a few months remotely with no side effects. She stopped having periods in September 2014. She did not take hormone replacement  SOCIAL HISTORY:  Odena worked as [name] for [employer] but she has not worked since her diagnosis of cancer. She applied for disability but was been denied twice; since her 2018 visit here she has been approved for disability. [name] is a Merchant navy officer with [employer]. At home it's just the 2 of them, with no pets. Tarrie's children from an earlier marriage are Robin Franklin. Robin Franklin, who lives in [place] and is a Public relations account executive; and Robin Franklin, who lives in [place] and is a Biochemist, clinical. The patient has 4 grandchildren +2 by adoption. She attends a [employer] locally    ADVANCED DIRECTIVES: Not in place   HEALTH MAINTENANCE: Social History   Tobacco Use  . Smoking status: Never Smoker  . Smokeless tobacco: Never Used  Substance Use Topics  . Alcohol use: No  . Drug use: No     Colonoscopy: 2013/ [facility]  PAP: February 2015  Bone density: Never  Lipid panel:  Allergies  Allergen Reactions  . Other Rash    Unknown Flu Vaccine from 2015 caused rash    Current Outpatient Medications  Medication Sig Dispense Refill  . ALPRAZolam (XANAX) 0.5 MG tablet Take 0.5 mg by mouth daily as needed for anxiety. For anxiety    . BUSPIRONE HCL PO Take by mouth as needed.    . cyclobenzaprine (FLEXERIL) 10 MG tablet Take 10 mg by mouth as needed for muscle spasms.    Marland Kitchen gabapentin (NEURONTIN) 100 MG capsule TAKE 1 CAPSULE (100 MG TOTAL) BY MOUTH 2 (TWO) TIMES DAILY. 60 capsule 1  . levothyroxine (SYNTHROID, LEVOTHROID)  75 MCG tablet Take 75 mcg by mouth daily before breakfast.    . Melatonin 3 MG TABS Take 3 mg by mouth at bedtime as needed (Sleep).    . naproxen sodium (ANAPROX) 220 MG tablet Take 220 mg by mouth 2 (two) times daily with a meal.    . omeprazole (PRILOSEC) 40 MG capsule Take 1 capsule (40 mg total) by mouth daily. 90 capsule 4  . Probiotic Product (PROBIOTIC DAILY PO) Take by mouth.    . Selenium 100 MCG CAPS Take 1 capsule by mouth daily.     No current facility-administered medications for this visit.     OBJECTIVE: Middle-aged African-American woman   Vitals:   10/27/18 1335  BP: 110/77  Pulse: 67  Resp: 18  Temp: 98.7 F (37.1 C)  SpO2: 100%     Body mass index is 26.94 kg/m.    ECOG FS:1 - Symptomatic but completely ambulatory   Sclerae unicteric, EOMs intact No cervical or supraclavicular adenopathy Lungs no rales or rhonchi Heart regular rate and rhythm Abd soft,  nontender, positive bowel sounds MSK no upper extremity lymphedema Neuro: nonfocal, well oriented, appropriate affect Breasts: The right breast is benign.  The left breast is status post lumpectomy and radiation.  There is no evidence of disease recurrence.  Both axillae are benign.  LAB RESULTS:  CMP     Component Value Date/Time   NA 142 10/09/2017 0922   K 4.2 10/09/2017 0922   CL 103 01/13/2014 1928   CO2 27 10/09/2017 0922   GLUCOSE 90 10/09/2017 0922   BUN 13.5 10/09/2017 0922   CREATININE 0.9 10/09/2017 0922   CALCIUM 9.5 10/09/2017 0922   PROT 7.1 10/09/2017 0922   ALBUMIN 4.0 10/09/2017 0922   AST 16 10/09/2017 0922   ALT 15 10/09/2017 0922   ALKPHOS 53 10/09/2017 0922   BILITOT 0.54 10/09/2017 0922   GFRNONAA >60 12/23/2010 1229   GFRAA  12/23/2010 1229    >60        The eGFR has been calculated using the MDRD equation. This calculation has not been validated in all clinical situations. eGFR's persistently <60 mL/min signify possible Chronic Kidney Disease.    INo results found for: SPEP, UPEP  Lab Results  Component Value Date   WBC 4.2 10/27/2018   NEUTROABS 2.1 10/27/2018   HGB 12.7 10/27/2018   HCT 38.9 10/27/2018   MCV 84.4 10/27/2018   PLT 214 10/27/2018      Chemistry      Component Value Date/Time   NA 142 10/09/2017 0922   K 4.2 10/09/2017 0922   CL 103 01/13/2014 1928   CO2 27 10/09/2017 0922   BUN 13.5 10/09/2017 0922   CREATININE 0.9 10/09/2017 0922      Component Value Date/Time   CALCIUM 9.5 10/09/2017 0922   ALKPHOS 53 10/09/2017 0922   AST 16 10/09/2017 0922   ALT 15 10/09/2017 0922   BILITOT 0.54 10/09/2017 0922       No results found for: LABCA2  No components found for: LABCA125  No results for input(s): INR in the last 168 hours.  Urinalysis    Component Value Date/Time   COLORURINE YELLOW 01/13/2014 1924   APPEARANCEUR CLEAR 01/13/2014 1924   LABSPEC >1.030 (H) 01/13/2014 1924   PHURINE 5.5 01/13/2014 1924    GLUCOSEU NEGATIVE 01/13/2014 1924   HGBUR NEGATIVE 01/13/2014 1924   BILIRUBINUR NEGATIVE 01/13/2014 1924   KETONESUR NEGATIVE 01/13/2014 [place] NEGATIVE 01/13/2014  1924   UROBILINOGEN 0.2 01/13/2014 1924   NITRITE NEGATIVE 01/13/2014 1924   LEUKOCYTESUR NEGATIVE 01/13/2014 1924    STUDIES: Dg Chest 2 View  Result Date: 10/10/2018 CLINICAL DATA:  Chest pain EXAM: CHEST - 2 VIEW COMPARISON:  October 01, 2016. FINDINGS: The lungs are clear. The heart size and pulmonary vascularity are normal. No adenopathy. No pneumothorax. No bone lesions. IMPRESSION: No edema or consolidation. Electronically Signed   By: Lowella Grip III M.D.   On: 10/10/2018 11:28    ASSESSMENT: 57 y.o. [place] woman status post excisional biopsy 11/15/2014 of a 4.6 cm left axillary tail mass showing a poorly differentiated carcinoma, triple negative, with an MIB-1 of 90%.  (1) s/p left axillary lymph node dissection under Dr.Howard-McNatt 12/23/2014 with 1 of 14 lymph nodes positive; TX N1, stage II; repeat prognostic panel again triple negative  (2) adjuvant chemotherapy started 01/17/2015, consisting of cyclophosphamide and doxorubicin 4 given in dose dense fashion, completed 02/28/2015, followed by paclitaxel and carboplatin given weekly with 12 cycles planned, but stopped after only 3 cycles because of progressive neuropathy, last dose 03/28/2015  (3) radiation 05/17/2015 through 06/24/2015:Left breast 5040 cGy in 28 sessions; left supraclavicular/axillary region 5040 cGy in 28 sessions  (4) genetics testing November 29 2015 through the Breast/Ovarian gene panel offered by [facility] found no deleterious mutations in ATM, BARD1, BRCA1, BRCA2, BRIP1, CDH1, CHEK2, EPCAM, FANCC, MLH1, MSH2, MSH6, NBN, PALB2, PMS2, PTEN, RAD51C, RAD51D, TP53, and XRCC2  PLAN: Dahlia is just about 4 years out from definitive surgery for her breast cancer with no evidence of disease recurrence.  This is  very favorable.  She was supposed to have had her mammogram before.  She tells me she is thinking about not having any more mammograms because of the radiation.  We discussed the very small amount of radiation involved in mammography and the fact that while mammography probably does increase the risk of breast cancer the expected rate is 1 breast cancer per million women screened as opposed to many thousands of women who is lives are saved because her breast cancer is found early.  She tells me she is going to think about this.  She will see me one more time a year from now.  At that point I will likely release her from follow-up.  She knows to call for any other issue that may develop before then.   Magrinat, Virgie Dad, MD  10/27/18 2:03 PM Medical Oncology and Hematology [facility] [address] [place], [place] [address] Tel. [phone]    Fax. [phone]   I, Jacqualyn Posey am acting as a Education administrator for Chauncey Cruel, MD.   I, Lurline Del MD, have reviewed the above documentation for accuracy and completeness, and I agree with the above.

## 2018-10-27 ENCOUNTER — Inpatient Hospital Stay: Payer: 59 | Attending: Oncology | Admitting: Oncology

## 2018-10-27 ENCOUNTER — Inpatient Hospital Stay: Payer: 59

## 2018-10-27 VITALS — BP 110/77 | HR 67 | Temp 98.7°F | Resp 18 | Ht 67.0 in | Wt 172.0 lb

## 2018-10-27 DIAGNOSIS — Z853 Personal history of malignant neoplasm of breast: Secondary | ICD-10-CM | POA: Diagnosis not present

## 2018-10-27 DIAGNOSIS — Z171 Estrogen receptor negative status [ER-]: Secondary | ICD-10-CM

## 2018-10-27 DIAGNOSIS — C50612 Malignant neoplasm of axillary tail of left female breast: Secondary | ICD-10-CM

## 2018-10-27 LAB — COMPREHENSIVE METABOLIC PANEL
ALK PHOS: 60 U/L (ref 38–126)
ALT: 10 U/L (ref 0–44)
AST: 17 U/L (ref 15–41)
Albumin: 4.6 g/dL (ref 3.5–5.0)
Anion gap: 10 (ref 5–15)
BILIRUBIN TOTAL: 0.4 mg/dL (ref 0.3–1.2)
BUN: 14 mg/dL (ref 6–20)
CALCIUM: 9.6 mg/dL (ref 8.9–10.3)
CO2: 27 mmol/L (ref 22–32)
CREATININE: 0.95 mg/dL (ref 0.44–1.00)
Chloride: 106 mmol/L (ref 98–111)
GFR calc Af Amer: >60 mL/min (ref >60)
GFR calc non Af Amer: >60 mL/min (ref >60)
GLUCOSE: 96 mg/dL (ref 70–99)
POTASSIUM: 4 mmol/L (ref 3.5–5.1)
Sodium: 143 mmol/L (ref 135–145)
TOTAL PROTEIN: 7.5 g/dL (ref 6.5–8.1)

## 2018-10-27 LAB — CBC WITH DIFFERENTIAL/PLATELET
ABS IMMATURE GRANULOCYTES: 0.01 10*3/uL (ref 0.00–0.07)
BASOS PCT: 1 %
Basophils Absolute: 0 10*3/uL (ref 0.0–0.1)
Eosinophils Absolute: 0 10*3/uL (ref 0.0–0.5)
Eosinophils Relative: 1 %
HCT: 38.9 % (ref 36.0–46.0)
Hemoglobin: 12.7 g/dL (ref 12.0–15.0)
Immature Granulocytes: 0 %
Lymphocytes Relative: 39 %
Lymphs Abs: 1.7 10*3/uL (ref 0.7–4.0)
MCH: 27.5 pg (ref 26.0–34.0)
MCHC: 32.6 g/dL (ref 30.0–36.0)
MCV: 84.4 fL (ref 80.0–100.0)
Monocytes Absolute: 0.4 10*3/uL (ref 0.1–1.0)
Monocytes Relative: 10 %
NEUTROS ABS: 2.1 10*3/uL (ref 1.7–7.7)
Neutrophils Relative %: 49 %
PLATELETS: 214 10*3/uL (ref 150–400)
RBC: 4.61 MIL/uL (ref 3.87–5.11)
RDW: 13 % (ref 11.5–15.5)
WBC: 4.2 10*3/uL (ref 4.0–10.5)
nRBC: 0 % (ref 0.0–0.2)

## 2018-11-03 ENCOUNTER — Other Ambulatory Visit: Payer: Self-pay | Admitting: *Deleted

## 2018-11-03 DIAGNOSIS — Z171 Estrogen receptor negative status [ER-]: Secondary | ICD-10-CM

## 2018-11-03 DIAGNOSIS — C50612 Malignant neoplasm of axillary tail of left female breast: Secondary | ICD-10-CM

## 2018-11-04 ENCOUNTER — Other Ambulatory Visit: Payer: Self-pay | Admitting: *Deleted

## 2018-11-10 DIAGNOSIS — Z124 Encounter for screening for malignant neoplasm of cervix: Secondary | ICD-10-CM | POA: Diagnosis not present

## 2018-11-10 DIAGNOSIS — Z6826 Body mass index (BMI) 26.0-26.9, adult: Secondary | ICD-10-CM | POA: Diagnosis not present

## 2018-11-10 DIAGNOSIS — Z01419 Encounter for gynecological examination (general) (routine) without abnormal findings: Secondary | ICD-10-CM | POA: Diagnosis not present

## 2018-11-10 LAB — HM PAP SMEAR: HM Pap smear: NORMAL

## 2018-11-14 ENCOUNTER — Ambulatory Visit
Admission: RE | Admit: 2018-11-14 | Discharge: 2018-11-14 | Disposition: A | Discharge status: Home / Self Care | Payer: Medicare Other | Source: Ambulatory Visit | Attending: Oncology | Admitting: Oncology

## 2018-11-14 DIAGNOSIS — Z171 Estrogen receptor negative status [ER-]: Secondary | ICD-10-CM

## 2018-11-14 DIAGNOSIS — C50612 Malignant neoplasm of axillary tail of left female breast: Secondary | ICD-10-CM

## 2018-11-14 DIAGNOSIS — R918 Other nonspecific abnormal finding of lung field: Secondary | ICD-10-CM | POA: Diagnosis not present

## 2018-11-14 HISTORY — DX: Personal history of irradiation: Z92.3

## 2018-11-14 HISTORY — DX: Personal history of antineoplastic chemotherapy: Z92.21

## 2019-04-08 DIAGNOSIS — M7918 Myalgia, other site: Secondary | ICD-10-CM | POA: Diagnosis not present

## 2019-04-08 DIAGNOSIS — M545 Low back pain: Secondary | ICD-10-CM | POA: Diagnosis not present

## 2019-04-08 DIAGNOSIS — M5136 Other intervertebral disc degeneration, lumbar region: Secondary | ICD-10-CM | POA: Diagnosis not present

## 2019-04-15 ENCOUNTER — Other Ambulatory Visit: Payer: Self-pay | Admitting: Rehabilitation

## 2019-04-15 DIAGNOSIS — M545 Low back pain, unspecified: Secondary | ICD-10-CM

## 2019-04-16 ENCOUNTER — Other Ambulatory Visit: Payer: Self-pay

## 2019-04-16 ENCOUNTER — Ambulatory Visit
Admission: RE | Admit: 2019-04-16 | Discharge: 2019-04-16 | Disposition: A | Discharge status: Home / Self Care | Payer: 59 | Source: Ambulatory Visit | Attending: Rehabilitation | Admitting: Rehabilitation

## 2019-04-16 DIAGNOSIS — M545 Low back pain, unspecified: Secondary | ICD-10-CM

## 2019-04-16 DIAGNOSIS — M48061 Spinal stenosis, lumbar region without neurogenic claudication: Secondary | ICD-10-CM | POA: Diagnosis not present

## 2019-04-22 ENCOUNTER — Other Ambulatory Visit: Payer: Self-pay

## 2019-04-22 ENCOUNTER — Other Ambulatory Visit: Payer: 59

## 2019-04-22 DIAGNOSIS — Z20822 Contact with and (suspected) exposure to covid-19: Secondary | ICD-10-CM

## 2019-04-24 LAB — NOVEL CORONAVIRUS, NAA: SARS-CoV-2, NAA: NOT DETECTED

## 2019-08-04 ENCOUNTER — Ambulatory Visit (INDEPENDENT_AMBULATORY_CARE_PROVIDER_SITE_OTHER): Payer: 59 | Admitting: Family Medicine

## 2019-08-04 ENCOUNTER — Encounter: Payer: Self-pay | Admitting: Family Medicine

## 2019-08-04 ENCOUNTER — Other Ambulatory Visit: Payer: Self-pay

## 2019-08-04 VITALS — BP 130/88 | HR 62 | Temp 98.0°F | Ht 67.0 in | Wt 175.2 lb

## 2019-08-04 DIAGNOSIS — E039 Hypothyroidism, unspecified: Secondary | ICD-10-CM

## 2019-08-04 DIAGNOSIS — E785 Hyperlipidemia, unspecified: Secondary | ICD-10-CM | POA: Diagnosis not present

## 2019-08-04 DIAGNOSIS — E559 Vitamin D deficiency, unspecified: Secondary | ICD-10-CM

## 2019-08-04 DIAGNOSIS — K219 Gastro-esophageal reflux disease without esophagitis: Secondary | ICD-10-CM | POA: Diagnosis not present

## 2019-08-04 DIAGNOSIS — G479 Sleep disorder, unspecified: Secondary | ICD-10-CM | POA: Insufficient documentation

## 2019-08-04 MED ORDER — OMEPRAZOLE 40 MG PO CPDR: 40.0000 mg | DELAYED_RELEASE_CAPSULE | Freq: Every day | ORAL | 4 refills | Status: DC

## 2019-08-04 MED ORDER — LEVOTHYROXINE SODIUM 75 MCG PO TABS: 75.0000 ug | ORAL_TABLET | Freq: Every day | ORAL | 3 refills | Status: DC

## 2019-08-04 MED ORDER — BUSPIRONE HCL 5 MG PO TABS: 5.0000 mg | ORAL_TABLET | Freq: Two times a day (BID) | ORAL | 5 refills | Status: DC

## 2019-08-04 NOTE — Progress Notes (Signed)
New Patient Office Visit  Subjective:  Patient ID: Robin Franklin, female    DOB: [birth year]  Age: 58 y.o. MRN: [id number]  CC:  Chief Complaint  Patient presents with  . New Patient (Initial Visit)  . Hypothyroidism  . Back Pain    HPI Robin Franklin presents for sleep disturbance where pt feels "something is going all over body"-not chills and does not affect breathing.  Pt wakes startled with panic.  Pt has taken anti-anxiety medication in the past. Pt has taken xanax and buspar in the past.   Breast CA-5 years ago with radiation and chemo-lymph nodes 1/14, no breast surgery +FH mother-breast CA- Mammogram-sees oncology-5 years this yar  GERD-omeprazole daily  Back-radiofrequency treatment-Spine and Scoliosis-flexeril/neurotonin/naprosyn-following  neurontin-flexeril-naproxen   Past Medical History:  Diagnosis Date  . Anxiety   . Arthritis   . Breast cancer ([place])   . Family history of breast cancer   . Headache(784.0)   . Hypothyroidism   . Joint pain   . Personal history of chemotherapy   . Personal history of radiation therapy   . S/P radiation therapy 05/17/2015 through 06/24/2015    Left breast 5040 cGy in 28 sessions; left supraclavicular/axillary region 5040 cGy in 28 sessions     Past Surgical History:  Procedure Laterality Date  . BREAST LUMPECTOMY Left 2015  . BREAST REDUCTION SURGERY    . CERVICAL CONE BIOPSY    . COLONOSCOPY  11/06/2011   Procedure: COLONOSCOPY;  Surgeon: Dorothyann Peng, MD;  [facility];  Service: Endoscopy;  Laterality: N/A;  11:30 AM  . cyst removed from neck    . REDUCTION MAMMAPLASTY Bilateral 2001  . STERIOD INJECTION      Family History  Problem Relation Age of Onset  . Breast cancer Mother 4  . Heart attack Father   . Heart attack Maternal Uncle   . Cancer Maternal Uncle        NOS  . Prostate cancer Maternal Uncle   . Breast cancer Cousin         dx in her 69s  . Colon cancer Neg Hx     Social History  Live with husband Socioeconomic History  . Marital status: Married    Spouse name: Not on file  . Number of children: 2  . Years of education: Not on file  . Highest education level: Not on file  Occupational History  . Not on file  Social Needs  . Financial resource strain: Not on file  . Food insecurity    Worry: Not on file    Inability: Not on file  . Transportation needs    Medical: Not on file    Non-medical: Not on file  Tobacco Use  . Smoking status: Never Smoker  . Smokeless tobacco: Never Used  Substance and Sexual Activity  . Alcohol use: No  . Drug use: No  . Sexual activity: Yes  Lifestyle  . Physical activity    Days per week: Not on file    Minutes per session: Not on file  . Stress: Not on file  Relationships  . Social Herbalist on phone: Not on file    Gets together: Not on file    Attends religious service: Not on file    Active member of club or organization: Not on file    Attends meetings of clubs or organizations: Not on file    Relationship status: Not on file  .  Intimate partner violence    Fear of current or ex partner: Not on file    Emotionally abused: Not on file    Physically abused: Not on file    Forced sexual activity: Not on file  Other Topics Concern  . Not on file  Social History Narrative  . Not on file    ROS Review of Systems  Constitutional: Negative.   HENT: Negative.   Respiratory: Negative.   Cardiovascular: Negative.   Gastrointestinal:       GERD  Genitourinary: Negative.   Musculoskeletal: Positive for back pain, myalgias and neck pain.  Neurological: Positive for light-headedness and numbness.  Psychiatric/Behavioral: Positive for sleep disturbance.    Objective:   Today's Vitals: BP 130/88 (BP Location: Right Arm, Patient Position: Sitting, Cuff Size: Normal)   Pulse 62   Temp 98 F (36.7 C) (Oral)   Ht 5\' 7"  (1.702 m)   Wt  175 lb 3.2 oz (79.5 kg)   LMP 08/09/2013   SpO2 99%   BMI 27.44 kg/m   Physical Exam Vitals signs reviewed.  Constitutional:      Appearance: Normal appearance.  HENT:     Head: Normocephalic and atraumatic.  Neck:     Musculoskeletal: No muscular tenderness.  Cardiovascular:     Rate and Rhythm: Normal rate and regular rhythm.     Pulses: Normal pulses.     Heart sounds: Normal heart sounds.  Pulmonary:     Effort: Pulmonary effort is normal.     Breath sounds: Normal breath sounds.  Lymphadenopathy:     Cervical: No cervical adenopathy.  Skin:    General: Skin is warm.  Neurological:     General: No focal deficit present.     Mental Status: She is alert and oriented to person, place, and time.  Psychiatric:        Mood and Affect: Mood normal.     Assessment & Plan:    Outpatient Encounter Medications as of 08/04/2019  Medication Sig  . BUSPIRONE HCL PO Take by mouth as needed.  . cyclobenzaprine (FLEXERIL) 10 MG tablet Take 10 mg by mouth as needed for muscle spasms.  Marland Kitchen gabapentin (NEURONTIN) 100 MG capsule TAKE 1 CAPSULE (100 MG TOTAL) BY MOUTH 2 (TWO) TIMES DAILY.  Marland Kitchen levothyroxine (SYNTHROID, LEVOTHROID) 75 MCG tablet Take 75 mcg by mouth daily before breakfast.  . Melatonin 3 MG TABS Take 3 mg by mouth at bedtime as needed (Sleep).  . naproxen sodium (ANAPROX) 220 MG tablet Take 220 mg by mouth 2 (two) times daily with a meal.  . omeprazole (PRILOSEC) 40 MG capsule Take 1 capsule (40 mg total) by mouth daily.  . Probiotic Product (PROBIOTIC DAILY PO) Take by mouth.  . Selenium 100 MCG CAPS Take 1 capsule by mouth daily.  . [DISCONTINUED] ALPRAZolam (XANAX) 0.5 MG tablet Take 0.5 mg by mouth daily as needed for anxiety. For anxiety   No facility-administered encounter medications on file as of 08/04/2019.    1. Hypothyroidism, unspecified type - TSH  2. Hyperlipidemia, unspecified hyperlipidemia type - Lipid panel - Comprehensive Metabolic Panel (CMET)  3.  Sleep disturbance Took xanax in the past for concerns symptoms related to anxiety - Ambulatory referral to Sleep Studies D/w pt taking buspar for anxiety-pt with difficulty sleeping due to back pain-followed by spine/scoliosis-concern for taking controlled substance. 4. Gastroesophageal reflux disease without esophagitis omeprazole Follow-up:  1 week-review labwork, taking buspar Tamotsu Wiederholt Hannah Beat, MD

## 2019-08-04 NOTE — Patient Instructions (Addendum)
Start Buspar twice a day 5mg -first at night  Fasting labwork

## 2019-08-05 LAB — COMPREHENSIVE METABOLIC PANEL
AG Ratio: 1.8 (calc) (ref 1.0–2.5)
ALT: 10 U/L (ref 6–29)
AST: 13 U/L (ref 10–35)
Albumin: 4.6 g/dL (ref 3.6–5.1)
Alkaline phosphatase (APISO): 52 U/L (ref 37–153)
BUN: 13 mg/dL (ref 7–25)
CO2: 28 mmol/L (ref 20–32)
Calcium: 9.7 mg/dL (ref 8.6–10.4)
Chloride: 105 mmol/L (ref 98–110)
Creat: 0.82 mg/dL (ref 0.50–1.05)
Globulin: 2.5 g/dL (calc) (ref 1.9–3.7)
Glucose, Bld: 78 mg/dL (ref 65–99)
Potassium: 4 mmol/L (ref 3.5–5.3)
Sodium: 141 mmol/L (ref 135–146)
Total Bilirubin: 0.5 mg/dL (ref 0.2–1.2)
Total Protein: 7.1 g/dL (ref 6.1–8.1)

## 2019-08-05 LAB — LIPID PANEL
Cholesterol: 197 mg/dL (ref <200)
HDL: 63 mg/dL (ref >=50)
LDL Cholesterol (Calc): 111 mg/dL (calc) — ABNORMAL HIGH
Non-HDL Cholesterol (Calc): 134 mg/dL (calc) — ABNORMAL HIGH (ref <130)
Total CHOL/HDL Ratio: 3.1 (calc) (ref <5.0)
Triglycerides: 115 mg/dL (ref <150)

## 2019-08-05 LAB — VITAMIN D 25 HYDROXY (VIT D DEFICIENCY, FRACTURES): Vit D, 25-Hydroxy: 66 ng/mL (ref 30–100)

## 2019-08-05 LAB — TSH: TSH: 0.28 mIU/L — ABNORMAL LOW (ref 0.40–4.50)

## 2019-08-11 ENCOUNTER — Telehealth: Payer: Self-pay

## 2019-08-11 NOTE — Telephone Encounter (Signed)
Robin Franklin is calling to get lab results, please advise?

## 2019-08-12 NOTE — Telephone Encounter (Signed)
Spoke to Alba and she understands

## 2019-08-17 ENCOUNTER — Other Ambulatory Visit: Payer: Self-pay | Admitting: Family Medicine

## 2019-08-17 DIAGNOSIS — E039 Hypothyroidism, unspecified: Secondary | ICD-10-CM

## 2019-08-17 NOTE — Telephone Encounter (Signed)
Happy to schedule pt an appointment to discuss labwork Vit D normal Lipid panel wnl-we have a dietitian who would be happy to discuss low cholesterol diets with pt                        Referral if pt request TSH slightly low .28-would not recommend pt change dose before a recheck along with T4 to determine if change needed ( last 2 readings .58 and .88) would recommend a recheck on her TSH and T4 taking the same dose in 1 month. Remember to take thyroid medication separately from other medication, vitamins and food. Allow 2 hours if possible.

## 2019-08-17 NOTE — Telephone Encounter (Signed)
Dr Holly Bodily please review labs patient is concerned about Cholesterol and wants to know if we have a diet plan for her to follow and and also her TSH was low and is questioning medication dosage, please advise?

## 2019-08-17 NOTE — Telephone Encounter (Signed)
Patient is calling back and states she wants to speak with the nurse to go over her lab results. She said she called last week and was told to call back this week to review them.

## 2019-08-18 ENCOUNTER — Other Ambulatory Visit: Payer: Self-pay | Admitting: Family Medicine

## 2019-08-18 DIAGNOSIS — E785 Hyperlipidemia, unspecified: Secondary | ICD-10-CM

## 2019-08-18 NOTE — Telephone Encounter (Signed)
Patient understands and she is willing to go see the dietician for cholesterol diet

## 2019-09-15 ENCOUNTER — Encounter (INDEPENDENT_AMBULATORY_CARE_PROVIDER_SITE_OTHER): Payer: Self-pay

## 2019-09-15 ENCOUNTER — Other Ambulatory Visit: Payer: Self-pay

## 2019-09-15 ENCOUNTER — Ambulatory Visit (INDEPENDENT_AMBULATORY_CARE_PROVIDER_SITE_OTHER): Payer: 59 | Admitting: Family Medicine

## 2019-09-15 VITALS — BP 126/82 | HR 62 | Temp 98.5°F | Ht 67.0 in | Wt 172.0 lb

## 2019-09-15 DIAGNOSIS — E039 Hypothyroidism, unspecified: Secondary | ICD-10-CM

## 2019-09-15 DIAGNOSIS — G479 Sleep disorder, unspecified: Secondary | ICD-10-CM

## 2019-09-15 NOTE — Progress Notes (Signed)
Established Patient Office Visit  Subjective:  Patient ID: Robin Franklin, female    DOB: 12-31-1960  Age: 58 y.o. MRN: [id number]  CC:  Chief Complaint  Patient presents with  . Follow-up    f/u 6 wk   . Hypothyroidism    HPI Robin Franklin presents for TSH-over-replaced-needs to be changed-did not complete blood work Buspar-anxiety-took am/pm-stopped and only took at [employer] sure she wants to take medication-not sure it works well for her-previously took xanax   Past Medical History:  Diagnosis Date  . Anxiety   . Arthritis   . Breast cancer ([place])   . Family history of breast cancer   . Headache(784.0)   . Hypothyroidism   . Joint pain   . Personal history of chemotherapy   . Personal history of radiation therapy   . S/P radiation therapy 05/17/2015 through 06/24/2015    Left breast 5040 cGy in 28 sessions; left supraclavicular/axillary region 5040 cGy in 28 sessions     Past Surgical History:  Procedure Laterality Date  . BREAST LUMPECTOMY Left 2015  . BREAST REDUCTION SURGERY    . CERVICAL CONE BIOPSY    . COLONOSCOPY  11/06/2011   Procedure: COLONOSCOPY;  Surgeon: Dorothyann Peng, MD;  [facility]: [facility];  Service: Endoscopy;  Laterality: N/A;  11:30 AM  . cyst removed from neck    . REDUCTION MAMMAPLASTY Bilateral 2001  . STERIOD INJECTION      Family History  Problem Relation Age of Onset  . Breast cancer Mother 27  . Heart attack Father   . Heart attack Maternal Uncle   . Cancer Maternal Uncle        NOS  . Prostate cancer Maternal Uncle   . Breast cancer Cousin        dx in her 17s  . Colon cancer Neg Hx     Social History   Socioeconomic History  . Marital status: Married    Spouse name: Not on file  . Number of children: 2  . Years of education: Not on file  . Highest education level: Not on file  Occupational History  . Not on file  Social Needs  . Financial  resource strain: Not on file  . Food insecurity    Worry: Not on file    Inability: Not on file  . Transportation needs    Medical: Not on file    Non-medical: Not on file  Tobacco Use  . Smoking status: Never Smoker  . Smokeless tobacco: Never Used  Substance and Sexual Activity  . Alcohol use: No  . Drug use: No  . Sexual activity: Yes  Lifestyle  . Physical activity    Days per week: Not on file    Minutes per session: Not on file  . Stress: Not on file  Relationships  . Social Herbalist on phone: Not on file    Gets together: Not on file    Attends religious service: Not on file    Active member of club or organization: Not on file    Attends meetings of clubs or organizations: Not on file    Relationship status: Not on file  . Intimate partner violence    Fear of current or ex partner: Not on file    Emotionally abused: Not on file    Physically abused: Not on file    Forced sexual activity: Not on file  Other Topics Concern  . Not  on file  Social History Narrative  . Not on file    Outpatient Medications Prior to Visit  Medication Sig Dispense Refill  . busPIRone (BUSPAR) 5 MG tablet Take 1 tablet (5 mg total) by mouth 2 (two) times daily. 60 tablet 5  . cyclobenzaprine (FLEXERIL) 10 MG tablet Take 10 mg by mouth as needed for muscle spasms.    Marland Kitchen gabapentin (NEURONTIN) 100 MG capsule TAKE 1 CAPSULE (100 MG TOTAL) BY MOUTH 2 (TWO) TIMES DAILY. 60 capsule 1  . levothyroxine (SYNTHROID) 75 MCG tablet Take 1 tablet (75 mcg total) by mouth daily before breakfast. 90 tablet 3  . Melatonin 3 MG TABS Take 3 mg by mouth at bedtime as needed (Sleep).    . naproxen sodium (ANAPROX) 220 MG tablet Take 220 mg by mouth 2 (two) times daily with a meal.    . omeprazole (PRILOSEC) 40 MG capsule Take 1 capsule (40 mg total) by mouth daily. 90 capsule 4  . Probiotic Product (PROBIOTIC DAILY PO) Take by mouth.    . Selenium 100 MCG CAPS Take 1 capsule by mouth daily.      No facility-administered medications prior to visit.     Allergies  Allergen Reactions  . Other Rash    Unknown Flu Vaccine from 2015 caused rash    ROS Review of Systems  Endocrine:       Hypothyroid  Psychiatric/Behavioral: The patient is nervous/anxious.       Objective:    Physical Exam  Constitutional: She appears well-nourished. No distress.  Cardiovascular: Normal rate and regular rhythm.  Pulmonary/Chest: Effort normal and breath sounds normal.  Psychiatric: She has a normal mood and affect. Her behavior is normal.  50% of time used in counseling on medication, side effects, differences from benzos, need for blood levels for TSH  BP 126/82 (BP Location: Right Arm, Patient Position: Sitting, Cuff Size: Normal)   Pulse 62   Temp 98.5 F (36.9 C) (Oral)   Ht 5' 7"  (1.702 m)   Wt 172 lb (78 kg)   LMP 08/09/2013   SpO2 98%   BMI 26.94 kg/m  Wt Readings from Last 3 Encounters:  09/15/19 172 lb (78 kg)  08/04/19 175 lb 3.2 oz (79.5 kg)  10/27/18 172 lb (78 kg)     Health Maintenance Due  Topic Date Due  . Hepatitis C Screening  03-08-1961  . HIV Screening  11/28/1975  . TETANUS/TDAP  11/28/1979  . INFLUENZA VACCINE  06/20/2019  . PAP SMEAR-Modifier  07/03/2019    Lab Results  Component Value Date   TSH 0.28 (L) 08/04/2019   Lab Results  Component Value Date   WBC 4.2 10/27/2018   HGB 12.7 10/27/2018   HCT 38.9 10/27/2018   MCV 84.4 10/27/2018   PLT 214 10/27/2018   Lab Results  Component Value Date   NA 141 08/04/2019   K 4.0 08/04/2019   CHLORIDE 108 10/09/2017   CO2 28 08/04/2019   GLUCOSE 78 08/04/2019   BUN 13 08/04/2019   CREATININE 0.82 08/04/2019   BILITOT 0.5 08/04/2019   ALKPHOS 60 10/27/2018   AST 13 08/04/2019   ALT 10 08/04/2019   PROT 7.1 08/04/2019   ALBUMIN 4.6 10/27/2018   CALCIUM 9.7 08/04/2019   ANIONGAP 10 10/27/2018   EGFR >60 10/09/2017   Lab Results  Component Value Date   CHOL 197 08/04/2019   Lab  Results  Component Value Date   HDL 63 08/04/2019   Lab Results  Component Value Date   LDLCALC 111 (H) 08/04/2019   Lab Results  Component Value Date   TRIG 115 08/04/2019   Lab Results  Component Value Date   CHOLHDL 3.1 08/04/2019      Assessment & Plan:  1. Sleep disturbance  - Ambulatory referral to Sleep Studies D/w pt buspar not the same as xanax-encouraged her to continue to try medication 2.  Hypothyroid-have TSH to determine medication level needed Follow-up:    LISA Hannah Beat, MD

## 2019-09-15 NOTE — Patient Instructions (Signed)
Sleep referral for evaluation  labwork today for TSH

## 2019-09-16 LAB — TSH: TSH: 0.51 mIU/L (ref 0.40–4.50)

## 2019-09-16 LAB — T4, FREE: Free T4: 1.2 ng/dL (ref 0.8–1.8)

## 2019-09-17 ENCOUNTER — Encounter: Payer: Self-pay | Admitting: Oncology

## 2019-09-17 ENCOUNTER — Telehealth: Payer: Self-pay | Admitting: *Deleted

## 2019-09-17 NOTE — Telephone Encounter (Signed)
This RN attempted to return call to per her VM left previously as well as noted call to [facility] regarding " am I allowed to lift more then 5 lbs because I am under PT for my back and they want me to do weight lifting and when I had my surgery I was told not to lift greater then 5 lbs "  Obtained identified VM- detailed message left informing pt concern per above was for immediate post surgical healing-  She is allowed to lift more with these caveats:   the PT who is recommending understanding that she has had 14 lymph nodes remove in left axilla. Weight lift amount needs to be done in gradients - starting low and moving up. She may need to wear a compression sleeve while weight lifting. PT needs to monitor for any signs of swelling for cessation of weight exercises and contact this office.  This RN's name and office number given for return call per above.

## 2019-09-21 ENCOUNTER — Other Ambulatory Visit: Payer: Self-pay

## 2019-09-21 ENCOUNTER — Encounter: Payer: Medicare Other | Attending: Family Medicine | Admitting: Nutrition

## 2019-09-21 VITALS — Ht 67.0 in | Wt 173.0 lb

## 2019-09-21 DIAGNOSIS — E782 Mixed hyperlipidemia: Secondary | ICD-10-CM | POA: Insufficient documentation

## 2019-09-21 NOTE — Progress Notes (Signed)
  Medical Nutrition Therapy:  Appt start time: [id number] end time:  1600    Assessment:  Primary concerns today:  Lives with her husband. And they use air fyer. Hyperlipidemia. Cancer breast 2016.  She wants to work on improving her cholesterol levels. Eats 2-3 meals per day. Cooks at home mostly. Working on eating more fresh fruits, vegetables especially after cancer treatments. Walks for exercise but knows she needs to do more. Wants to reduce risk of any CVD. Would like to lose a few pounds.   PCP Dr. Holly Bodily, [facility].  Vitals with BMI 09/21/2019 09/15/2019  Height 5\' 7"  5\' 7"   Weight 173 lbs 172 lbs  BMI 0000000 0000000  Systolic  [id number]  Diastolic  82  Pulse  62   CMP Latest Ref Rng & Units 08/04/2019 10/27/2018 10/09/2017  Glucose 65 - 99 mg/dL 78 96 90  BUN 7 - 25 mg/dL 13 14 13.5  Creatinine 0.50 - 1.05 mg/dL 0.82 0.95 0.9  Sodium 135 - 146 mmol/L 141 143 142  Potassium 3.5 - 5.3 mmol/L 4.0 4.0 4.2  Chloride 98 - 110 mmol/L 105 106 -  CO2 20 - 32 mmol/L 28 27 27   Calcium 8.6 - 10.4 mg/dL 9.7 9.6 9.5  Total Protein 6.1 - 8.1 g/dL 7.1 7.5 7.1  Total Bilirubin 0.2 - 1.2 mg/dL 0.5 0.4 0.54  Alkaline Phos 38 - 126 U/L - 60 53  AST 10 - 35 U/L 13 17 16   ALT 6 - 29 U/L 10 10 15     Lipid Panel     Component Value Date/Time   CHOL 197 08/04/2019 1250   TRIG 115 08/04/2019 1250   HDL 63 08/04/2019 1250   CHOLHDL 3.1 08/04/2019 1250   LDLCALC 111 (H) 08/04/2019 1250     Preferred Learning Style:     No preference indicated    Learning Readiness:     Ready  Change in progress   MEDICATIONS:   DIETARY INTAKE:  24-hr recall:  B ( AM): Oatmeal  With raisin and cinnamon. Snk ( AM): fruit or nuts L ( PM):  Spaghetti 1 cup [place], Snk ( PM):  D ( PM): chicken and green beans, water Snk ( PM):  Beverages:   Usual physical activity: walks some  Estimated energy needs: 1200 calories 135 g carbohydrates 90 g protein 33 g fat  Progress Towards  Goal(s):  In progress.   Nutritional Diagnosis:  NB-1.1 Food and nutrition-related knowledge deficit As related to Hyperlipidemia.  As evidenced by LDL 111 mg/dll.    Intervention:  Nutrition therapy for Hyperlipemia.  Goals Follow High Fiber Low Saturated fat diet Eat thee meals per day on time Avoid skipping meals Avoid processed foods Walk 60 minutes 3-4 times per week Get LDL less than 100 mg/dl. Lose 2 lbs per month   Teaching Method Utilized:  Visual Auditory Hands on  Handouts given during visit include:  Nutrition Therapy for Hyperlipidemia  My Plate   Barriers to learning/adherence to lifestyle change: none  Demonstrated degree of understanding via:  Teach Back   Monitoring/Evaluation:  Dietary intake, exercise,  and body weight in 3 month(s).

## 2019-09-29 ENCOUNTER — Encounter: Payer: Self-pay | Admitting: Neurology

## 2019-09-29 ENCOUNTER — Other Ambulatory Visit: Payer: Self-pay

## 2019-09-29 ENCOUNTER — Encounter: Payer: Self-pay | Admitting: Nutrition

## 2019-09-29 ENCOUNTER — Ambulatory Visit (INDEPENDENT_AMBULATORY_CARE_PROVIDER_SITE_OTHER): Payer: 59 | Admitting: Neurology

## 2019-09-29 VITALS — BP 118/82 | HR 70 | Temp 98.0°F | Ht 67.0 in | Wt 174.0 lb

## 2019-09-29 DIAGNOSIS — E663 Overweight: Secondary | ICD-10-CM | POA: Diagnosis not present

## 2019-09-29 DIAGNOSIS — R351 Nocturia: Secondary | ICD-10-CM | POA: Diagnosis not present

## 2019-09-29 DIAGNOSIS — R0683 Snoring: Secondary | ICD-10-CM

## 2019-09-29 DIAGNOSIS — R0689 Other abnormalities of breathing: Secondary | ICD-10-CM | POA: Diagnosis not present

## 2019-09-29 DIAGNOSIS — R519 Headache, unspecified: Secondary | ICD-10-CM

## 2019-09-29 NOTE — Patient Instructions (Signed)
Goals Follow High Fiber Low Saturated fat diet Eat thee meals per day on time Avoid skipping meals Avoid processed foods Walk 60 minutes 3-4 times per week Get LDL less than 100 mg/dl. Lose 2 lbs per month

## 2019-09-29 NOTE — Patient Instructions (Addendum)
Thank you for choosing [facility] for your sleep related care! It was nice to meet you today! I appreciate that you entrust me with your sleep related healthcare concerns. I hope, I was able to address at least some of your concerns today, and that I can help you feel reassured and also get better.    Here is what we discussed today and what we came up with as our plan for you:    Based on your symptoms and your exam I believe you are at risk for obstructive sleep apnea (aka OSA), and I think we should proceed with a sleep study to determine whether you do or do not have OSA and how severe it is. Call our office, when you are ready to pursue sleep study testing.  Even, if you have mild OSA, I may want you to consider treatment with CPAP, as treatment of even borderline or mild sleep apnea can result and improvement of symptoms such as sleep disruption, daytime sleepiness, nighttime bathroom breaks, restless leg symptoms, improvement of headache syndromes, even improved mood disorder.   Please remember, the long-term risks and ramifications of untreated moderate to severe obstructive sleep apnea are: increased Cardiovascular disease, including congestive heart failure, stroke, difficult to control hypertension, treatment resistant obesity, arrhythmias, especially irregular heartbeat commonly known as A. Fib. (atrial fibrillation); even type 2 diabetes has been linked to untreated OSA.   Sleep apnea can cause disruption of sleep and sleep deprivation in most cases, which, in turn, can cause recurrent headaches, problems with memory, mood, concentration, focus, and vigilance. Most people with untreated sleep apnea report excessive daytime sleepiness, which can affect their ability to drive. Please do not drive if you feel sleepy. Patients with sleep apnea developed difficulty initiating and maintaining sleep (aka insomnia).   Having sleep apnea may increase your risk for other sleep  disorders, including involuntary behaviors sleep such as sleep terrors, sleep talking, sleepwalking.    Having sleep apnea can also increase your risk for restless leg syndrome and leg movements at night.   Please note that untreated obstructive sleep apnea may carry additional perioperative morbidity. Patients with significant obstructive sleep apnea (typically, in the moderate to severe degree) should receive, if possible, perioperative PAP (positive airway pressure) therapy and the surgeons and particularly the anesthesiologists should be informed of the diagnosis and the severity of the sleep disordered breathing.   I will likely see you back after your sleep study to go over the test results and where to go from there. We will call you after your sleep study to advise about the results (most likely, you will hear from [place], my nurse) and to set up an appointment at the time, as necessary.

## 2019-09-29 NOTE — Progress Notes (Signed)
Subjective:    Patient ID: Robin Franklin is a 58 y.o. female.  HPI     Star Age, MD, PhD [facility] [address] [place], [place] [address]  Dear Dr. Holly Bodily,   I saw your patient, Robin Franklin, upon your kind request to my sleep clinic today for initial consultation of her sleep disorder, in particular, concern for underlying obstructive sleep apnea.  The patient is unaccompanied today.  As you know, Robin Franklin is a 58 year old right-handed woman with an underlying medical history of breast cancer, hypothyroidism, status post left breast lumpectomy,  radiation and chemotherapy, anxiety, arthritis, and mildly overweight state, who reports snoring and occasional sleep disruption, she has had infrequent episodes of waking up with a sense of choking or drowning.  She has not been noted to have any witnessed apneas per husband's feedback to her.  She is a restless sleeper.  She has residual back pain and also discomfort on the left side her lumpectomy.  Bedtime is around 1030 and rise time around 6.  She has had rare morning headaches.  She does report nocturia about twice per average night.  She is not aware of any family history of OSA but suspects that her 66 year old son could have sleep apnea.  She denies any significant daytime somnolence.  She no longer works.  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  She drinks caffeine and limitation, 1 serving of green tea per day on average.  She is a non-smoker and does not utilize alcohol.  Her weight has been more or less stable. I reviewed your office note from 09/16/2019.  Her Epworth sleepiness score is 5 out of 24, fatigue severity score is 12 out of 63.   Her Past Medical History Is Significant For: Past Medical History:  Diagnosis Date  . Anxiety   . Arthritis   . Breast cancer ([place])   . Family history of breast cancer   . Headache(784.0)   . Hypothyroidism   . Joint pain   .  Personal history of chemotherapy   . Personal history of radiation therapy   . S/P radiation therapy 05/17/2015 through 06/24/2015    Left breast 5040 cGy in 28 sessions; left supraclavicular/axillary region 5040 cGy in 28 sessions     Her Past Surgical History Is Significant For: Past Surgical History:  Procedure Laterality Date  . BREAST LUMPECTOMY Left 2015  . BREAST REDUCTION SURGERY    . CERVICAL CONE BIOPSY    . COLONOSCOPY  11/06/2011   Procedure: COLONOSCOPY;  Surgeon: Dorothyann Peng, MD;  [facility];  Service: Endoscopy;  Laterality: N/A;  11:30 AM  . cyst removed from neck    . REDUCTION MAMMAPLASTY Bilateral 2001  . STERIOD INJECTION      Her Family History Is Significant For: Family History  Problem Relation Age of Onset  . Breast cancer Mother 31  . Heart attack Father   . Heart attack Maternal Uncle   . Cancer Maternal Uncle        NOS  . Prostate cancer Maternal Uncle   . Breast cancer Cousin        dx in her 67s  . Colon cancer Neg Hx     Her Social History Is Significant For: Social History   Socioeconomic History  . Marital status: Married    Spouse name: Not on file  . Number of children: 2  .  Years of education: Not on file  . Highest education level: Not on file  Occupational History  . Not on file  Social Needs  . Financial resource strain: Not on file  . Food insecurity    Worry: Not on file    Inability: Not on file  . Transportation needs    Medical: Not on file    Non-medical: Not on file  Tobacco Use  . Smoking status: Never Smoker  . Smokeless tobacco: Never Used  Substance and Sexual Activity  . Alcohol use: No  . Drug use: No  . Sexual activity: Yes  Lifestyle  . Physical activity    Days per week: Not on file    Minutes per session: Not on file  . Stress: Not on file  Relationships  . Social Herbalist on phone: Not on file     Gets together: Not on file    Attends religious service: Not on file    Active member of club or organization: Not on file    Attends meetings of clubs or organizations: Not on file    Relationship status: Not on file  Other Topics Concern  . Not on file  Social History Narrative  . Not on file    Her Allergies Are:  Allergies  Allergen Reactions  . Other Rash    Unknown Flu Vaccine from 2015 caused rash  :   Her Current Medications Are:  Outpatient Encounter Medications as of 09/29/2019  Medication Sig  . busPIRone (BUSPAR) 5 MG tablet Take 1 tablet (5 mg total) by mouth 2 (two) times daily.  . cyclobenzaprine (FLEXERIL) 10 MG tablet Take 10 mg by mouth as needed for muscle spasms.  Marland Kitchen gabapentin (NEURONTIN) 100 MG capsule TAKE 1 CAPSULE (100 MG TOTAL) BY MOUTH 2 (TWO) TIMES DAILY.  Marland Kitchen levothyroxine (SYNTHROID) 75 MCG tablet Take 1 tablet (75 mcg total) by mouth daily before breakfast.  . Melatonin 3 MG TABS Take 3 mg by mouth at bedtime as needed (Sleep).  . naproxen sodium (ANAPROX) 220 MG tablet Take 220 mg by mouth 2 (two) times daily with a meal.  . omeprazole (PRILOSEC) 40 MG capsule Take 1 capsule (40 mg total) by mouth daily.  . Probiotic Product (PROBIOTIC DAILY PO) Take by mouth.  . Selenium 100 MCG CAPS Take 1 capsule by mouth daily.   No facility-administered encounter medications on file as of 09/29/2019.   :  Review of Systems:  Out of a complete 14 point review of systems, all are reviewed and negative with the exception of these symptoms as listed below: Review of Systems  Neurological:       Pt presents today to discuss her sleep. Pt has never had a sleep study but does endorse snoring.  Epworth Sleepiness Scale 0= would never doze 1= slight chance of dozing 2= moderate chance of dozing 3= high chance of dozing  Sitting and reading: 2 Watching TV: 1 Sitting inactive in a public place (ex. Theater or meeting): 0 As a passenger in a car for an hour  without a break: 1 Lying down to rest in the afternoon: 1 Sitting and talking to someone: 0 Sitting quietly after lunch (no alcohol): 0 In a car, while stopped in traffic: 0 Total: 5     Objective:  Neurological Exam  Physical Exam Physical Examination:   Vitals:   09/29/19 1314  BP: 118/82  Pulse: 70  Temp: 98 F (36.7 C)  General Examination: The patient is a very pleasant 58 y.o. female in no acute distress. She appears well-developed and well-nourished and well groomed.   HEENT: Normocephalic, atraumatic, pupils are equal, round and reactive to light, extraocular tracking is Well preserved, hearing is grossly intact, face is symmetric with normal facial animation, speech is clear without dysarthria, hypophonia or voice tremor.  Neck is supple, no carotid bruits.  Airway examination reveals Mallampati class III, smaller airway entry, smaller tonsils, tongue protrudes centrally in palate elevates symmetrically.  She has a mild to moderate overbite.Neck circumference is 14-3/8 inches.   Chest: Clear to auscultation without wheezing, rhonchi or crackles noted.  Heart: S1+S2+0, regular and normal without murmurs, rubs or gallops noted.   Abdomen: Soft, non-tender and non-distended with normal bowel sounds appreciated on auscultation.  Extremities: There is no pitting edema in the distal lower extremities bilaterally.   Skin: Warm and dry without trophic changes noted.  Musculoskeletal: exam reveals no obvious joint deformities, tenderness or joint swelling or erythema.   Neurologically:  Mental status: The patient is awake, alert and oriented in all 4 spheres. Her immediate and remote memory, attention, language skills and fund of knowledge are appropriate. There is no evidence of aphasia, agnosia, apraxia or anomia. Speech is clear with normal prosody and enunciation. Thought process is linear. Mood is normal and affect is normal.  Cranial nerves II - XII are as described  above under HEENT exam.  Motor exam: Normal bulk, strength and tone is noted. There is no tremor. Fine motor skills and coordination: grossly intact.  Cerebellar testing: No dysmetria or intention tremor. There is no truncal or gait ataxia.  Sensory exam: intact to light touch in the upper and lower extremities.  Gait, station and balance: She stands easily. No veering to one side is noted. No leaning to one side is noted. Posture is age-appropriate and stance is narrow based. Gait shows normal stride length and normal pace. No problems turning are noted.  Assessment and Plan:  In summary, Robin Franklin is a very pleasant 58 y.o.-year old female  with an underlying medical history of breast cancer, hypothyroidism, status post left breast lumpectomy,  radiation and chemotherapy, anxiety, arthritis, and mildly overweight state, whose history and physical exam are concerning for obstructive sleep apnea (OSA). I had a long chat with the patient about my findings and the diagnosis of OSA, its prognosis and treatment options. We talked about medical treatments, surgical interventions and non-pharmacological approaches. I explained in particular the risks and ramifications of untreated moderate to severe OSA, especially with respect to developing cardiovascular disease down the Road, including congestive heart failure, difficult to treat hypertension, cardiac arrhythmias, or stroke. Even type 2 diabetes has, in part, been linked to untreated OSA. Symptoms of untreated OSA include daytime sleepiness, memory problems, mood irritability and mood disorder such as depression and anxiety, lack of energy, as well as recurrent headaches, especially morning headaches. We talked about trying to maintain a healthy lifestyle in general, as well as the importance of weight control. We also talked about the importance of good sleep hygiene. I recommended the following at this time: sleep study.   I explained the sleep test  procedure to the patient and also outlined possible surgical and non-surgical treatment options of OSA, including the use of a custom-made dental device (which would require a referral to a specialist dentist or oral surgeon), upper airway surgical options, such as traditional UPPP or a novel less invasive surgical option  in the form of Inspire hypoglossal nerve stimulation (which would involve a referral to an ENT surgeon). I also explained the CPAP treatment option to the patient, who indicated that she would be willing to try CPAP if the need arises. I explained the importance of being compliant with PAP treatment, not only for insurance purposes but primarily to improve Her symptoms, and for the patient's long term health benefit, including to reduce Her cardiovascular risks. I answered all her questions today and the patient was in agreement. I plan to see her back after the sleep study is completed and encouraged her to call with any interim questions, concerns, problems or updates.   Thank you very much for allowing me to participate in the care of this nice patient. If I can be of any further assistance to you please do not hesitate to call me at [phone].  Sincerely,   Star Age, MD, PhD

## 2019-10-02 ENCOUNTER — Other Ambulatory Visit: Payer: Self-pay | Admitting: Oncology

## 2019-10-02 DIAGNOSIS — Z9889 Other specified postprocedural states: Secondary | ICD-10-CM

## 2019-10-12 ENCOUNTER — Ambulatory Visit (INDEPENDENT_AMBULATORY_CARE_PROVIDER_SITE_OTHER): Payer: 59 | Admitting: Neurology

## 2019-10-12 ENCOUNTER — Other Ambulatory Visit: Payer: Self-pay

## 2019-10-12 DIAGNOSIS — G4733 Obstructive sleep apnea (adult) (pediatric): Secondary | ICD-10-CM | POA: Diagnosis not present

## 2019-10-12 DIAGNOSIS — R351 Nocturia: Secondary | ICD-10-CM

## 2019-10-12 DIAGNOSIS — R0689 Other abnormalities of breathing: Secondary | ICD-10-CM

## 2019-10-12 DIAGNOSIS — R0683 Snoring: Secondary | ICD-10-CM

## 2019-10-12 DIAGNOSIS — R519 Headache, unspecified: Secondary | ICD-10-CM

## 2019-10-12 DIAGNOSIS — E663 Overweight: Secondary | ICD-10-CM

## 2019-10-14 ENCOUNTER — Telehealth: Payer: Self-pay

## 2019-10-14 NOTE — Procedures (Signed)
Patient Information     First Name: Robin Last Name: Franklin ID: [id number]  Birth Date: [birth year] Age: 58 Gender: Female  Referring Provider: Maryruth Hancock, MD BMI: 27.3 (W=174 lb, H=5' 7'')  Neck Circ.:  14 '' Epworth:  5/24   Sleep Study Information    Study Date: Oct 12, 2019 S/H/A Version: [id number] / 4.1.1528 / 75  History:    58 year old woman with a history of breast cancer, hypothyroidism, status post left breast lumpectomy, radiation and chemotherapy, anxiety, arthritis, and mildly overweight state, who reports snoring and occasional sleep disruption, she has had infrequent episodes of waking up with a sense of choking.  Summary & Diagnosis:      OSA Recommendations:      This home sleep test demonstrates moderate obstructive sleep apnea with a total AHI of 21.8/hour and O2 nadir of 80%. Snoring appeared to be mild to moderate at times. Given the patient's medical history and sleep related complaints, treatment with positive airway pressure (in the form of CPAP) is recommended. This will require a full night CPAP titration study for proper treatment settings, O2 monitoring and mask fitting. Based on the severity of the sleep disordered breathing an attended titration study is indicated. However, patient's insurance has denied an attended sleep study; therefore, the patient will be advised to proceed with an autoPAP titration/trial at home for now. Please note that untreated obstructive sleep apnea may carry additional perioperative morbidity. Patients with significant obstructive sleep apnea should receive perioperative PAP therapy and the surgeons and particularly the anesthesiologist should be informed of the diagnosis and the severity of the sleep disordered breathing. The patient should be cautioned not to drive, work at heights, or operate dangerous or heavy equipment when tired or sleepy. Review and reiteration of good sleep hygiene measures should be pursued with any patient.  Other causes of the patient's symptoms, including circadian rhythm disturbances, an underlying mood disorder, medication effect and/or an underlying medical problem cannot be ruled out based on this test. Clinical correlation is recommended. The patient and her referring provider will be notified of the test results. The patient will be seen in follow up in sleep clinic at [facility].  I certify that I have reviewed the raw data recording prior to the issuance of this report in accordance with the standards of the American Academy of Sleep Medicine (AASM).  Star Age, MD, PhD [facility] [place]) Diplomat, ABPN (Neurology and Sleep)           Sleep Summary  Oxygen Saturation Statistics   Start Study Time: End Study Time: Total Recording Time:     10:18:03 PM 6:48:49 AM       8 h, 30 min  Total Sleep Time % REM of Sleep Time:  6 h, 57 min  29.1    Mean: 95 Minimum: 80 Maximum: 100  Mean of Desaturations Nadirs (%):   92  Oxygen Desaturation %:  4-9 10-20 >20 Total  Events Number Total   31 88.6   3  1   8.6 2.9  35 100.0  Oxygen Saturation <90 <=88  <85 <80 <70  Duration (minutes): Sleep % 0.1 0.0 0.1 0.0  0.1 0.0  0.0 0.0 0.0 0.0     Respiratory Indices      Total Events REM NREM All Night  pRDI:  157  pAHI:  138 ODI:  35  pAHIc:  16  % CSR: 0.0 28.9 28.3 9.6 4.8 23.3 19.5 4.1  1.7 24.8 21.8 5.5 2.5       Pulse Rate Statistics during Sleep (BPM)      Mean:  67 Minimum: 50 Maximum: 103    Indices are calculated using technically valid sleep time of  6 hrs, 19 min. pRDI/pAHI are calculated using oxi desaturations ? 3%     Body Position Statistics  Position Supine Prone Right Left Non-Supine  Sleep (min) 216.3 0.0 124.5 77.0 201.5  Sleep % 51.8 0.0 29.8 18.4 48.2  pRDI 28.0 N/A 14.7 18.5 16.4  pAHI 25.5 N/A 14.0 11.5 12.8  ODI 7.0 N/A 3.7 1.8 2.8     Snoring Statistics Snoring Level (dB) >40 >50 >60 >70 >80 >Threshold (45)   Sleep (min) 39.2 5.6 1.9 0.0 0.0 9.9  Sleep % 9.4 1.3 0.5 0.0 0.0 2.4    Mean: 40 dB Sleep Stages Chart                              pAHI=19.4                                           Mild              Moderate                    Severe                                                 5              15                    30

## 2019-10-14 NOTE — Telephone Encounter (Signed)
-----   Message from Star Age, MD sent at 10/14/2019  9:11 AM EST ----- Patient referred by Dr. Holly Bodily, seen by me on 09/29/19, HST on 10/12/19.    Please call and notify the patient that the recent home sleep test showed obstructive sleep apnea in the moderate range (AHI total: 21.8/hour, O2 nadir 80%). While I recommend treatment for this in the form CPAP, her insurance will not approve a sleep study for this. They will likely only approve a trial of autoPAP, which means, that we don't have to bring her in for a sleep study with CPAP, but will let her start using a so called autoPAP machine at home, through a DME company (of her choice, or as per insurance requirement). The DME representative will educate her on how to use the machine, how to put the mask on, etc. I have placed an order in the chart. Please send referral, talk to patient, send report to referring MD. We will need a FU in sleep clinic for 10 weeks post-PAP set up, please arrange that with me or one of our NPs. Thanks,   Star Age, MD, PhD [facility])

## 2019-10-14 NOTE — Telephone Encounter (Signed)
I called pt to discuss her sleep study results. No answer, left a message asking her to call me back. 

## 2019-10-14 NOTE — Progress Notes (Signed)
Patient referred by Dr. Holly Bodily, seen by me on 09/29/19, HST on 10/12/19.    Please call and notify the patient that the recent home sleep test showed obstructive sleep apnea in the moderate range (AHI total: 21.8/hour, O2 nadir 80%). While I recommend treatment for this in the form CPAP, her insurance will not approve a sleep study for this. They will likely only approve a trial of autoPAP, which means, that we don't have to bring her in for a sleep study with CPAP, but will let her start using a so called autoPAP machine at home, through a DME company (of her choice, or as per insurance requirement). The DME representative will educate her on how to use the machine, how to put the mask on, etc. I have placed an order in the chart. Please send referral, talk to patient, send report to referring MD. We will need a FU in sleep clinic for 10 weeks post-PAP set up, please arrange that with me or one of our NPs. Thanks,   Star Age, MD, PhD [facility])

## 2019-10-14 NOTE — Telephone Encounter (Signed)
I called pt. I advised pt that Dr. Rexene Alberts reviewed their sleep study results and found that pt has moderate osa. Dr. Rexene Alberts recommends that pt start an auto-pap at home since her insurance won't approve an in lab titration. I reviewed PAP compliance expectations with the pt. Pt is agreeable to starting an auto-PAP. I advised pt that an order will be sent to a DME, [facility], and [facility] will call the pt within about one week after they file with the pt's insurance. [facility] will show the pt how to use the machine, fit for masks, and troubleshoot the auto-PAP if needed. A follow up appt was made for insurance purposes with Amy, NP on 12/28/19 at 9:00am. Pt verbalized understanding to arrive 15 minutes early and bring their auto-PAP. A letter with all of this information in it will be mailed to the pt as a reminder. I verified with the pt that the address we have on file is correct. Pt verbalized understanding of results. Pt had no questions at this time but was encouraged to call back if questions arise. I have sent the order to [facility] and have received confirmation that they have received the order.

## 2019-10-14 NOTE — Addendum Note (Signed)
Addended by: Star Age on: 10/14/2019 09:11 AM   Modules accepted: Orders

## 2019-10-28 ENCOUNTER — Other Ambulatory Visit: Payer: Self-pay

## 2019-10-28 DIAGNOSIS — Z171 Estrogen receptor negative status [ER-]: Secondary | ICD-10-CM

## 2019-10-28 DIAGNOSIS — C50612 Malignant neoplasm of axillary tail of left female breast: Secondary | ICD-10-CM

## 2019-10-28 NOTE — Progress Notes (Signed)
[place]  Telephone:([phone]) [phone] Fax:(336) [phone]    ID: Robin Franklin DOB: [birth year]  MR#: [id number]  XIP#:[id number]  Patient Care Team: Maryruth Hancock, MD as PCP - General (Family Medicine) Stark Klein, MD as Consulting Physician (General Surgery) Sacheen Arrasmith, Virgie Dad, MD as Consulting Physician (Oncology) Arloa Koh, MD (Inactive) as Consulting Physician (Radiation Oncology) Vania Rea, MD as Consulting Physician (Obstetrics and Gynecology) Newman Pies, MD as Consulting Physician (Neurosurgery) Angelina Ok, MD as Referring Physician (Surgery) Sylvan Cheese, NP as Nurse Practitioner (Hematology and Oncology) Gypsy Decant, DC as Physician Assistant (Chiropractic Medicine) OTHER MD:  CHIEF COMPLAINT: Triple negative breast cancer   CURRENT TREATMENT: Observation   INTERVAL HISTORY:  Robin Franklin returns today for follow-up of her triple negative breast cancer accompanied by her husband.. She continues under observation.  Since her last visit, she underwent bilateral diagnostic mammography with tomography at [facility] on 11/14/2018 showing: breast density category A; no evidence of malignancy in either breast.  She is due for repeat mammography later 11/16/2019  She also underwent lumbar spine MRI on 04/16/2019 for low back pain with bilateral leg pain. This showed: central and rightward protrusion at L4-5 appears acute, given its T2 hyperintense nature; borderline stenosis with right greater than left L5 neural impingement; central and leftward protrusion L3-4, mild facet arthropathy, left L4 neural impingement is possible; advanced disc space narrowing L2-3, no definite impingement.  Her sleep test documented below shows moderate sleep apnea.  Sleep test  REVIEW OF SYSTEMS: Robin Franklin continues to have problems with peripheral neuropathy.  Please particularly affect the right foot.  In particular the fourth toe of the right foot  sometimes can be quite painful.  She does have some neuropathy in the left foot as well.  She has some discomfort in the left upper arm which is of course the surgical side.  She tells me she has excellent range of motion there now however.  She has not had frank lymphedema.  Aside from these issues a detailed review of systems today was stable.   BREAST CANCER HISTORY: From the original intake note:  Robin Franklin herself noted a mass in her left axilla sometime in late November or she brought it to Dr. Nolon Rod attention and on 11/15/2014 she had a left mammogram and left ultrasound at the breast Center. The breast density was category 8. The left breast was entirely unremarkable, with no masses, distortion, or calcifications. In the left axilla there was a mass which was palpable and moderately firm. By ultrasound it measured 3.2 cm. There were no other axillary masses of concern.  Biopsy of the left axillary mass the same day showed ([id number]) a high-grade carcinoma which was estrogen and progesterone receptor negative, HER-2 negative, with a signals ratio of 1.53 and the number per cell of 2.60, with an MIB-1 of 95%. It was also gross cystic disease fluid protein negative, and negative for cytokeratin 20 and TTF-1. It was positive for cytokeratin 7. This was felt to be consistent with a breast primary, but gynecologic and other malignancies were also in the differential.  In general 04/08/2015 the patient had a diagnostic right mammogram, which was negative. On the same day she had bilateral breast MRI. There were no masses or abnormal enhancement in either breast. In the left axilla there was a round enhancing mass measuring 4.6 cm. There were mildly prominent smaller immediately adjacent lymph nodes. He was no right axillary or internal mammary adenopathy. An incidental lipoma was  noted in the left lateral chest wall.  Her subsequent history is as detailed below.   PAST MEDICAL HISTORY: Past Medical  History:  Diagnosis Date  . Anxiety   . Arthritis   . Breast cancer ([place])   . Family history of breast cancer   . Headache(784.0)   . Hypothyroidism   . Joint pain   . Personal history of chemotherapy   . Personal history of radiation therapy   . S/P radiation therapy 05/17/2015 through 06/24/2015    Left breast 5040 cGy in 28 sessions; left supraclavicular/axillary region 5040 cGy in 28 sessions     PAST SURGICAL HISTORY: Past Surgical History:  Procedure Laterality Date  . BREAST LUMPECTOMY Left 2015  . BREAST REDUCTION SURGERY    . CERVICAL CONE BIOPSY    . COLONOSCOPY  11/06/2011   Procedure: COLONOSCOPY;  Surgeon: Dorothyann Peng, MD;  Location: [facility];  Service: Endoscopy;  Laterality: N/A;  11:30 AM  . cyst removed from neck    . REDUCTION MAMMAPLASTY Bilateral 2001  . STERIOD INJECTION      FAMILY HISTORY Family History  Problem Relation Age of Onset  . Breast cancer Mother 69  . Heart attack Father   . Heart attack Maternal Uncle   . Cancer Maternal Uncle        NOS  . Prostate cancer Maternal Uncle   . Breast cancer Cousin        dx in her 18s  . Colon cancer Neg Hx    the patient's father died from a myocardial infarction at the age of 1. [name] died from breast cancer at the age of 24. It was diagnosed at age 30. The patient has 2 brothers, one sister. There is no other history of breast or ovarian cancer in the family to her knowledge.   GYNECOLOGIC HISTORY:  Patient's last menstrual period was 08/09/2013. Menarche age 36, first live birth age 71. The patient is GX P2. She used oral contraceptives for a few months remotely with no side effects. She stopped having periods in September 2014. She did not take hormone replacement   SOCIAL HISTORY:  Robin Franklin worked as [name] for [employer] but she has not worked since her diagnosis of cancer. As of her 2018  visit here she has been approved for disability. Her husband Robin Franklin worked as a Licensed conveyancer.  He is currently retired.  At home it's just the 2 of them, with no pets. Bleu's children from an earlier marriage are [name]. [name], who lives in [place] and is a Public relations account executive; and [name], who lives in [place] and is a Biochemist, clinical. The patient has 4 grandchildren +2 by adoption. She attends a [employer] locally    ADVANCED DIRECTIVES: Not in place   HEALTH MAINTENANCE: Social History   Tobacco Use  . Smoking status: Never Smoker  . Smokeless tobacco: Never Used  Substance Use Topics  . Alcohol use: No  . Drug use: No     Colonoscopy: 2013/ [facility]  PAP: February 2015  Bone density: Never  Lipid panel:  Allergies  Allergen Reactions  . Other Rash    Unknown Flu Vaccine from 2015 caused rash    Current Outpatient Medications  Medication Sig Dispense Refill  . busPIRone (BUSPAR) 5 MG tablet Take 1 tablet (5 mg total) by mouth 2 (two) times daily. 60 tablet 5  . cyclobenzaprine (FLEXERIL) 10 MG tablet Take  10 mg by mouth as needed for muscle spasms.    Marland Kitchen gabapentin (NEURONTIN) 100 MG capsule TAKE 1 CAPSULE (100 MG TOTAL) BY MOUTH 2 (TWO) TIMES DAILY. 60 capsule 1  . levothyroxine (SYNTHROID) 75 MCG tablet Take 1 tablet (75 mcg total) by mouth daily before breakfast. 90 tablet 3  . Melatonin 3 MG TABS Take 3 mg by mouth at bedtime as needed (Sleep).    . naproxen sodium (ANAPROX) 220 MG tablet Take 220 mg by mouth 2 (two) times daily with a meal.    . omeprazole (PRILOSEC) 40 MG capsule Take 1 capsule (40 mg total) by mouth daily. 90 capsule 4  . Probiotic Product (PROBIOTIC DAILY PO) Take by mouth.    . Selenium 100 MCG CAPS Take 1 capsule by mouth daily.     No current facility-administered medications for this visit.    OBJECTIVE: Middle-aged African-American woman who appears younger than stated age 40:   10/29/19 1245   BP: 111/72  Pulse: 64  Resp: 18  Temp: (!) 97.3 F (36.3 C)  SpO2: 100%     Body mass index is 27.22 kg/m.    ECOG FS:1 - Symptomatic but completely ambulatory   Sclerae unicteric, EOMs intact Wearing a mask No cervical or supraclavicular adenopathy Lungs no rales or rhonchi Heart regular rate and rhythm Abd soft, nontender, positive bowel sounds MSK no focal spinal tenderness, no left upper extremity lymphedema Neuro: nonfocal, well oriented, positive affect Breasts: The right breast is unremarkable.  The left breast is status post lumpectomy and radiation.  There is still minimal hyperpigmentation.  There is no evidence of local recurrence.  There what looks like a dark seborrheic keratosis on the left breast.  Both axillae are benign.  LAB RESULTS:  CMP     Component Value Date/Time   NA 141 08/04/2019 1250   NA 142 10/09/2017 0922   K 4.0 08/04/2019 1250   K 4.2 10/09/2017 0922   CL 105 08/04/2019 1250   CO2 28 08/04/2019 1250   CO2 27 10/09/2017 0922   GLUCOSE 78 08/04/2019 1250   GLUCOSE 90 10/09/2017 0922   BUN 13 08/04/2019 1250   BUN 13.5 10/09/2017 0922   CREATININE 0.82 08/04/2019 1250   CREATININE 0.9 10/09/2017 0922   CALCIUM 9.7 08/04/2019 1250   CALCIUM 9.5 10/09/2017 0922   PROT 7.1 08/04/2019 1250   PROT 7.1 10/09/2017 0922   ALBUMIN 4.6 10/27/2018 1324   ALBUMIN 4.0 10/09/2017 0922   AST 13 08/04/2019 1250   AST 16 10/09/2017 0922   ALT 10 08/04/2019 1250   ALT 15 10/09/2017 0922   ALKPHOS 60 10/27/2018 1324   ALKPHOS 53 10/09/2017 0922   BILITOT 0.5 08/04/2019 1250   BILITOT 0.54 10/09/2017 0922   GFRNONAA >60 10/27/2018 1324   GFRAA >60 10/27/2018 1324    INo results found for: SPEP, UPEP  Lab Results  Component Value Date   WBC 6.4 10/29/2019   NEUTROABS 3.9 10/29/2019   HGB 13.4 10/29/2019   HCT 40.8 10/29/2019   MCV 86.3 10/29/2019   PLT 232 10/29/2019      Chemistry      Component Value Date/Time   NA 141 08/04/2019 1250    NA 142 10/09/2017 0922   K 4.0 08/04/2019 1250   K 4.2 10/09/2017 0922   CL 105 08/04/2019 1250   CO2 28 08/04/2019 1250   CO2 27 10/09/2017 0922   BUN 13 08/04/2019 1250   BUN 13.5 10/09/2017  1779   CREATININE 0.82 08/04/2019 1250   CREATININE 0.9 10/09/2017 0922   GLU 98 04/21/2009 0000      Component Value Date/Time   CALCIUM 9.7 08/04/2019 1250   CALCIUM 9.5 10/09/2017 0922   ALKPHOS 60 10/27/2018 1324   ALKPHOS 53 10/09/2017 0922   AST 13 08/04/2019 1250   AST 16 10/09/2017 0922   ALT 10 08/04/2019 1250   ALT 15 10/09/2017 0922   BILITOT 0.5 08/04/2019 1250   BILITOT 0.54 10/09/2017 0922       No results found for: LABCA2  No components found for: LABCA125  No results for input(s): INR in the last 168 hours.  Urinalysis    Component Value Date/Time   COLORURINE YELLOW 01/13/2014 1924   APPEARANCEUR CLEAR 01/13/2014 1924   LABSPEC >1.030 (H) 01/13/2014 1924   PHURINE 5.5 01/13/2014 1924   GLUCOSEU NEGATIVE 01/13/2014 1924   HGBUR NEGATIVE 01/13/2014 1924   BILIRUBINUR NEGATIVE 01/13/2014 1924   KETONESUR NEGATIVE 01/13/2014 1924   PROTEINUR NEGATIVE 01/13/2014 1924   UROBILINOGEN 0.2 01/13/2014 1924   NITRITE NEGATIVE 01/13/2014 1924   LEUKOCYTESUR NEGATIVE 01/13/2014 1924    STUDIES: Home sleep test  Result Date: 10/12/2019 Star Age, MD     10/14/2019  9:07 AM Patient Information    First Name: Robin Last Name: Claramae Franklin: [id number] Birth Date: 1961-10-29 Age: 58 Gender: Female Referring Provider: Maryruth Hancock, MD BMI: 27.3 (W=174 lb, H=5' 7'') Neck Circ.:  14 '' Epworth:  5/24  Sleep Study Information   Study Date: Oct 12, 2019 S/H/A Version: 001.001.001.001 / 4.1.1528 / 94 History:   58 year old woman with a history of breast cancer, hypothyroidism, status post left breast lumpectomy, radiation and chemotherapy, anxiety, arthritis, and mildly overweight state, who reports snoring and occasional sleep disruption, she has had infrequent episodes of waking up  with a sense of choking. Summary & Diagnosis:    OSA Recommendations:    This home sleep test demonstrates moderate obstructive sleep apnea with a total AHI of 21.8/hour and O2 nadir of 80%. Snoring appeared to be mild to moderate at times. Given the patient's medical history and sleep related complaints, treatment with positive airway pressure (in the form of CPAP) is recommended. This will require a full night CPAP titration study for proper treatment settings, O2 monitoring and mask fitting. Based on the severity of the sleep disordered breathing an attended titration study is indicated. However, patient's insurance has denied an attended sleep study; therefore, the patient will be advised to proceed with an autoPAP titration/trial at home for now. Please note that untreated obstructive sleep apnea may carry additional perioperative morbidity. Patients with significant obstructive sleep apnea should receive perioperative PAP therapy and the surgeons and particularly the anesthesiologist should be informed of the diagnosis and the severity of the sleep disordered breathing. The patient should be cautioned not to drive, work at heights, or operate dangerous or heavy equipment when tired or sleepy. Review and reiteration of good sleep hygiene measures should be pursued with any patient. Other causes of the patient's symptoms, including circadian rhythm disturbances, an underlying mood disorder, medication effect and/or an underlying medical problem cannot be ruled out based on this test. Clinical correlation is recommended. The patient and her referring provider will be notified of the test results. The patient will be seen in follow up in sleep clinic at [facility]. I certify that I have reviewed the raw data recording prior to the issuance of this report in  accordance with the standards of the American Academy of Sleep Medicine (AASM). Star Age, MD, PhD [facility]) Diplomat, ABPN (Neurology  and Sleep) Sleep Summary  Oxygen Saturation Statistics Start Study Time: End Study Time: Total Recording Time:     10:18:03 PM 6:48:49 AM       8 h, 30 min Total Sleep Time % REM of Sleep Time:  6 h, 57 min 29.1  Mean: 95 Minimum: 80 Maximum: 100 Mean of Desaturations Nadirs (%):   92 Oxygen Desaturation %:  4-9 10-20 >20 Total Events Number Total   31 88._0 8.6 2.9  35 100.0 Oxygen Saturation <90 <=88  <85 <80 <70 Duration (minutes): Sleep % 0.1 0.0 0.1 0.0  0.1 0.0  0.0 0.0 0.0 0.0  Respiratory Indices   Total Events REM NREM All Night pRDI:  157 pAHI:  138 ODI:  35 pAHIc:  16 % CSR: 0.0 28.9 28.3 9.6 4.8 23.3 19.5 4.1 1.7 24.8 21.8 5.5 2.5    Pulse Rate Statistics during Sleep (BPM)   Mean:  67 Minimum: 50 Maximum: 103  Indices are calculated using technically valid sleep time of  6 hrs, 19 min. pRDI/pAHI are calculated using oxi desaturations ? 3% Body Position Statistics Position Supine Prone Right Left Non-Supine Sleep (min) 216.3 0.0 124.5 77.0 201.5 Sleep % 51.8 0.0 29.8 18.4 48.2 pRDI 28.0 N/A 14.7 18.5 16.4 pAHI 25.5 N/A 14.0 11.5 12.8 ODI 7.0 N/A 3.7 1.8 2.8   Snoring Statistics Snoring Level (dB) >40 >50 >60 >70 >80 >Threshold (45) Sleep (min) 39.2 5.6 1.9 0.0 0.0 9.9 Sleep % 9.4 1.3 0.5 0.0 0.0 2.4  Mean: 40 dB Sleep Stages Chart                             pAHI=19.4                                          Mild              Moderate                    Severe                                                 _1 ASSESSMENT: 58 y.o. [place] woman status post excisional biopsy 11/15/2014 of a 4.6 cm left axillary tail mass showing a poorly differentiated carcinoma, triple negative, with an MIB-1 of 90%.  (1) s/p left axillary lymph node dissection under Dr.Howard-McNatt 12/23/2014 with 1 of 14 lymph nodes positive; TX N1, stage II; repeat prognostic panel again triple negative  (2) adjuvant chemotherapy started 01/17/2015, consisting of  cyclophosphamide and doxorubicin 4 given in dose dense fashion, completed 02/28/2015, followed by paclitaxel and carboplatin given weekly with 12 cycles planned, but stopped after only 3 cycles because of progressive neuropathy, last dose 03/28/2015  (3) radiation 05/17/2015 through 06/24/2015:Left breast 5040 cGy in 28 sessions;  left supraclavicular/axillary region 5040 cGy in 28 sessions  (4) genetics testing November 29 2015 through the Breast/Ovarian gene panel offered by [facility] found no deleterious mutations in ATM, BARD1, BRCA1, BRCA2, BRIP1, CDH1, CHEK2, EPCAM, FANCC, MLH1, MSH2, MSH6, NBN, PALB2, PMS2, PTEN, RAD51C, RAD51D, TP53, and XRCC2  PLAN: Robin Franklin is just about 5 years out from definitive surgery for her breast cancer with no evidence of disease recurrence.  This is very favorable.  With regards to her neuropathy, she had not really used the Neurontin consistently and she still has some concerns regarding it.  I reassured her this is a benign drug and she can stop it at any time.  So long as it does not make her unusually groggy or sleepy she can try it for the neuropathy.  I went ahead and wrote her 100 mg 3 times a day as needed.  She understands triple negative breast cancers if they are going to recur tend to recur early.  Accordingly I am comfortable releasing her to her primary care physician at this point.  All she will need in terms of breast cancer follow-up is a yearly mammography and a yearly physician breast exam.  We discussed the dark spot on her left breast.  We discussed referral to a dermatologist to have this removed but right now she prefers not to proceed with that.  I do expect that this is a benign seborrheic keratosis.  I also suggested she could consider a podiatrist to see if the problem with the fourth toe of the right foot is really not so much neuropathy is something in the bones.  She is considering the  I will be glad to see Robin Franklin again any  point in the future if and when the need arises but as of now are making no further routine appointments for her here.  Robin Franklin, Virgie Dad, MD  10/29/19 1:07 PM Medical Oncology and Hematology [facility] [place], [place] [address] Tel. [phone]    Fax. [phone]   I, Wilburn Mylar, am acting as scribe for Dr. Virgie Dad. Robin Franklin.  I, Lurline Del MD, have reviewed the above documentation for accuracy and completeness, and I agree with the above.

## 2019-10-29 ENCOUNTER — Inpatient Hospital Stay (HOSPITAL_BASED_OUTPATIENT_CLINIC_OR_DEPARTMENT_OTHER): Payer: 59 | Admitting: Oncology

## 2019-10-29 ENCOUNTER — Other Ambulatory Visit: Payer: Self-pay

## 2019-10-29 ENCOUNTER — Inpatient Hospital Stay: Payer: 59 | Attending: Oncology

## 2019-10-29 VITALS — BP 111/72 | HR 64 | Temp 97.3°F | Resp 18 | Ht 67.0 in | Wt 173.8 lb

## 2019-10-29 DIAGNOSIS — G620 Drug-induced polyneuropathy: Secondary | ICD-10-CM

## 2019-10-29 DIAGNOSIS — G629 Polyneuropathy, unspecified: Secondary | ICD-10-CM | POA: Insufficient documentation

## 2019-10-29 DIAGNOSIS — K219 Gastro-esophageal reflux disease without esophagitis: Secondary | ICD-10-CM

## 2019-10-29 DIAGNOSIS — Z853 Personal history of malignant neoplasm of breast: Secondary | ICD-10-CM | POA: Insufficient documentation

## 2019-10-29 DIAGNOSIS — C50612 Malignant neoplasm of axillary tail of left female breast: Secondary | ICD-10-CM | POA: Diagnosis not present

## 2019-10-29 DIAGNOSIS — G4730 Sleep apnea, unspecified: Secondary | ICD-10-CM | POA: Diagnosis not present

## 2019-10-29 DIAGNOSIS — T451X5A Adverse effect of antineoplastic and immunosuppressive drugs, initial encounter: Secondary | ICD-10-CM

## 2019-10-29 DIAGNOSIS — Z171 Estrogen receptor negative status [ER-]: Secondary | ICD-10-CM

## 2019-10-29 LAB — CMP (CANCER CENTER ONLY)
ALT: 17 U/L (ref 0–44)
AST: 16 U/L (ref 15–41)
Albumin: 4.3 g/dL (ref 3.5–5.0)
Alkaline Phosphatase: 57 U/L (ref 38–126)
Anion gap: 8 (ref 5–15)
BUN: 13 mg/dL (ref 6–20)
CO2: 29 mmol/L (ref 22–32)
Calcium: 9.1 mg/dL (ref 8.9–10.3)
Chloride: 106 mmol/L (ref 98–111)
Creatinine: 0.97 mg/dL (ref 0.44–1.00)
GFR, Est AFR Am: >60 mL/min (ref >60)
GFR, Estimated: >60 mL/min (ref >60)
Glucose, Bld: 91 mg/dL (ref 70–99)
Potassium: 4.2 mmol/L (ref 3.5–5.1)
Sodium: 143 mmol/L (ref 135–145)
Total Bilirubin: 0.4 mg/dL (ref 0.3–1.2)
Total Protein: 7.2 g/dL (ref 6.5–8.1)

## 2019-10-29 LAB — CBC WITH DIFFERENTIAL (CANCER CENTER ONLY)
Abs Immature Granulocytes: 0.02 10*3/uL (ref 0.00–0.07)
Basophils Absolute: 0 10*3/uL (ref 0.0–0.1)
Basophils Relative: 1 %
Eosinophils Absolute: 0 10*3/uL (ref 0.0–0.5)
Eosinophils Relative: 1 %
HCT: 40.8 % (ref 36.0–46.0)
Hemoglobin: 13.4 g/dL (ref 12.0–15.0)
Immature Granulocytes: 0 %
Lymphocytes Relative: 31 %
Lymphs Abs: 2 10*3/uL (ref 0.7–4.0)
MCH: 28.3 pg (ref 26.0–34.0)
MCHC: 32.8 g/dL (ref 30.0–36.0)
MCV: 86.3 fL (ref 80.0–100.0)
Monocytes Absolute: 0.4 10*3/uL (ref 0.1–1.0)
Monocytes Relative: 7 %
Neutro Abs: 3.9 10*3/uL (ref 1.7–7.7)
Neutrophils Relative %: 60 %
Platelet Count: 232 10*3/uL (ref 150–400)
RBC: 4.73 MIL/uL (ref 3.87–5.11)
RDW: 13.1 % (ref 11.5–15.5)
WBC Count: 6.4 10*3/uL (ref 4.0–10.5)
nRBC: 0 % (ref 0.0–0.2)

## 2019-10-29 MED ORDER — GABAPENTIN 100 MG PO CAPS: 100.0000 mg | ORAL_CAPSULE | Freq: Three times a day (TID) | ORAL | 3 refills | Status: DC | PRN

## 2019-10-30 DIAGNOSIS — M79642 Pain in left hand: Secondary | ICD-10-CM | POA: Insufficient documentation

## 2019-11-03 ENCOUNTER — Telehealth: Payer: Self-pay

## 2019-11-03 DIAGNOSIS — G479 Sleep disorder, unspecified: Secondary | ICD-10-CM

## 2019-11-03 MED ORDER — BUSPIRONE HCL 5 MG PO TABS: 5.0000 mg | ORAL_TABLET | Freq: Two times a day (BID) | ORAL | 0 refills | Status: DC

## 2019-11-03 NOTE — Telephone Encounter (Signed)
LeighAnn Malic Rosten, CMA  

## 2019-11-06 ENCOUNTER — Ambulatory Visit: Payer: 59 | Admitting: Orthopedic Surgery

## 2019-11-19 ENCOUNTER — Other Ambulatory Visit: Payer: Self-pay

## 2019-11-19 ENCOUNTER — Ambulatory Visit
Admission: RE | Admit: 2019-11-19 | Discharge: 2019-11-19 | Disposition: A | Discharge status: Home / Self Care | Payer: 59 | Source: Ambulatory Visit | Attending: Oncology | Admitting: Oncology

## 2019-11-19 DIAGNOSIS — Z9889 Other specified postprocedural states: Secondary | ICD-10-CM

## 2019-11-22 ENCOUNTER — Other Ambulatory Visit: Payer: Self-pay

## 2019-11-22 ENCOUNTER — Ambulatory Visit
Admission: EM | Admit: 2019-11-22 | Discharge: 2019-11-22 | Disposition: A | Discharge status: Home / Self Care | Payer: Medicare Other | Attending: Emergency Medicine | Admitting: Emergency Medicine

## 2019-11-22 DIAGNOSIS — R1030 Lower abdominal pain, unspecified: Secondary | ICD-10-CM | POA: Insufficient documentation

## 2019-11-22 LAB — POCT URINALYSIS DIP (MANUAL ENTRY)
Bilirubin, UA: NEGATIVE
Glucose, UA: NEGATIVE mg/dL
Ketones, POC UA: NEGATIVE mg/dL
Leukocytes, UA: NEGATIVE
Nitrite, UA: NEGATIVE
Protein Ur, POC: NEGATIVE mg/dL
Spec Grav, UA: 1.02 (ref 1.010–1.025)
Urobilinogen, UA: 0.2 E.U./dL
pH, UA: 6 (ref 5.0–8.0)

## 2019-11-22 NOTE — ED Provider Notes (Signed)
[facility]    CSN: [id number] Arrival date & time: 11/22/19  1022      History   Chief Complaint Chief Complaint  Patient presents with  . Abdominal Pain    HPI Robin Franklin is a 59 y.o. female.   Robin Franklin 9 y old female presented to the urgent care with a complaint of lower abdominal pain that started  7 days ago.  Denies a precipitating event, or specific injury.  Patient localizes pain to her lower abdominal pain.  Describes it as constant and achy in character.  Has tried OTC medications without relief.  Symptoms are made worse with urination.  Reports similar symptoms in the past that resolved with treatment.  Denies fever, chills, appetite change, weight change, chest pain, nausea, vomiting, changes in bowel or bladder habits.  The history is provided by the patient. No language interpreter was used.    Past Medical History:  Diagnosis Date  . Anxiety   . Arthritis   . Breast cancer ([place])   . Family history of breast cancer   . Headache(784.0)   . Hypothyroidism   . Joint pain   . Personal history of chemotherapy   . Personal history of radiation therapy   . S/P radiation therapy 05/17/2015 through 06/24/2015    Left breast 5040 cGy in 28 sessions; left supraclavicular/axillary region 5040 cGy in 28 sessions     Patient Active Problem List   Diagnosis Date Noted  . Sleep apnea in adult 10/29/2019  . Gastroesophageal reflux disease without esophagitis 08/04/2019  . Hyperlipidemia 08/04/2019  . Sleep disturbance 08/04/2019  . Hypothyroidism 10/09/2017  . Genetic testing 11/29/2015  . Family history of breast cancer   . 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 Chemotherapy-induced nausea 03/21/2015  . Back pain 03/21/2015  . Constipation 03/21/2015  . Watery eyes 02/21/2015  . Mucositis due to chemotherapy 02/21/2015  . 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 BREAST LUMPECTOMY Left 2015  . BREAST REDUCTION SURGERY    . CERVICAL CONE BIOPSY    . COLONOSCOPY  11/06/2011   Procedure: COLONOSCOPY;  Surgeon: Dorothyann Peng, MD;  Location: [facility];  Service: Endoscopy;  Laterality: N/A;  11:30 AM  . cyst removed from neck    . REDUCTION MAMMAPLASTY Bilateral 2001  . STERIOD INJECTION      OB History   No obstetric history on file.      Home Medications    Prior to Admission medications   Medication Sig Start Date End Date Taking? Authorizing Provider  omeprazole (PRILOSEC) 40 MG capsule Take 1 capsule (40 mg total) by mouth daily. 08/04/19  Yes Corum, Rex Kras, MD  Probiotic Product (PROBIOTIC DAILY PO) Take by mouth.   Yes [provider]  busPIRone (BUSPAR) 5 MG tablet Take 1 tablet (5 mg total) by mouth 2 (two) times daily. 11/03/19 02/01/20  Maryruth Hancock, MD  cyclobenzaprine (FLEXERIL) 10 MG tablet Take 10 mg by mouth as needed for muscle spasms.    [provider]  gabapentin (NEURONTIN) 100 MG capsule Take 1 capsule (100 mg total) by mouth 3 (three) times daily as needed. 10/29/19   Magrinat, Virgie Dad, MD  levothyroxine (SYNTHROID) 75 MCG tablet Take 1 tablet (75 mcg total) by mouth daily before breakfast. 08/04/19   Corum, Rex Kras, MD  Melatonin 3 MG TABS  Take 3 mg by mouth at bedtime as needed (Sleep).    [provider]  naproxen sodium (ANAPROX) 220 MG tablet Take 220 mg by mouth 2 (two) times daily with a meal.    [provider]  Selenium 100 MCG CAPS Take 1 capsule by mouth daily.    [provider]    Family History Family History  Problem Relation Age of Onset  . Breast cancer Mother 95  . Heart attack Father   . Heart attack Maternal Uncle   . Cancer Maternal Uncle        NOS  . Prostate cancer Maternal Uncle   . Breast cancer Cousin        dx in her  12s  . Colon cancer Neg Hx     Social History Social History   Tobacco Use  . Smoking status: Never Smoker  . Smokeless tobacco: Never Used  Substance Use Topics  . Alcohol use: No  . Drug use: No     Allergies   Other   Review of Systems Review of Systems  Constitutional: Negative.   Respiratory: Negative.   Cardiovascular: Negative.   Gastrointestinal: Positive for abdominal pain.  Genitourinary: Negative.      Physical Exam Triage Vital Signs ED Triage Vitals  Enc Vitals Group     BP      Pulse      Resp      Temp      Temp src      SpO2      Weight      Height      Head Circumference      Peak Flow      Pain Score      Pain Loc      Pain Edu?      Excl. in [place]?    No data found.  Updated Vital Signs BP 126/86   Pulse 82   Temp 98.2 F (36.8 C)   Resp 18   LMP 08/09/2013   SpO2 99%   Visual Acuity Right Eye Distance:   Left Eye Distance:   Bilateral Distance:    Right Eye Near:   Left Eye Near:    Bilateral Near:     Physical Exam Vitals and nursing note reviewed.  Constitutional:      General: She is not in acute distress.    Appearance: She is well-developed and normal weight.  Cardiovascular:     Rate and Rhythm: Normal rate and regular rhythm.     Heart sounds: Normal heart sounds.  Pulmonary:     Effort: Pulmonary effort is normal.     Breath sounds: Normal breath sounds.  Abdominal:     General: Bowel sounds are normal.     Palpations: Abdomen is soft.     Tenderness: There is abdominal tenderness. There is no right CVA tenderness, left CVA tenderness, guarding or rebound. Negative signs include Murphy's sign.     Comments: Middle lower quadrant abdominal pain  Neurological:     Mental Status: She is alert.      UC Treatments / Results  Labs (all labs ordered are listed, but only abnormal results are displayed) Labs Reviewed  POCT URINALYSIS DIP (MANUAL ENTRY) - Abnormal; Notable for the following components:       Result Value   Blood, UA trace-intact (*)    All other components within normal limits  URINE CULTURE    EKG   Radiology No results found.  Procedures Procedures (including critical care time)  Medications Ordered in UC Medications - No data to display  Initial Impression / Assessment and Plan / UC Course  I have reviewed the triage vital signs and the nursing notes.  Pertinent labs & imaging results that were available during my care of the patient were reviewed by me and considered in my medical decision making (see chart for details).    Urine analysis test was completed and result review Result shows trace of blood.  Urine will be sent for culture.  Patient is stable for discharge.  Patient was advised that someone will call her if there are culture is abnormal.  Advised patient to follow-up with OB/GYN if symptoms worsening, or go to ED.  Patient verbalized understanding of the plan of care.  Final Clinical Impressions(s) / UC Diagnoses   Final diagnoses:  Lower abdominal pain     Discharge Instructions     Urine analysis showed trace of blood.  Urine is sent for culture Someone will call if your result is abnormal Get rest and drink fluids To follow-up with OB/GYN  If you experience new or worsening symptoms return or go to ER such as fever, chills, nausea, vomiting, diarrhea, bloody or dark tarry stools, constipation, urinary symptoms, worsening abdominal discomfort, symptoms that do not improve with medications, inability to keep fluids down, etc...      ED Prescriptions    None     PDMP not reviewed this encounter.   Emerson Monte, FNP 11/22/19 1105

## 2019-11-22 NOTE — Discharge Instructions (Addendum)
Urine analysis showed trace of blood.  Urine is sent for culture Someone will call if your result is abnormal Get rest and drink fluids To follow-up with OB/GYN  If you experience new or worsening symptoms return or go to ER such as fever, chills, nausea, vomiting, diarrhea, bloody or dark tarry stools, constipation, urinary symptoms, worsening abdominal discomfort, symptoms that do not improve with medications, inability to keep fluids down, etc..Marland Kitchen

## 2019-11-22 NOTE — ED Triage Notes (Signed)
Pt reports lower abdominal pain that began about a week ago , pt denies nausea, vomiting, and diarrhea, also denies constipation , last BM this morning

## 2019-11-23 LAB — URINE CULTURE: Culture: <10000 — AB

## 2019-11-26 ENCOUNTER — Other Ambulatory Visit: Payer: Self-pay

## 2019-11-26 ENCOUNTER — Telehealth (INDEPENDENT_AMBULATORY_CARE_PROVIDER_SITE_OTHER): Payer: 59 | Admitting: Family Medicine

## 2019-11-26 DIAGNOSIS — R8290 Unspecified abnormal findings in urine: Secondary | ICD-10-CM

## 2019-11-26 DIAGNOSIS — R1084 Generalized abdominal pain: Secondary | ICD-10-CM

## 2019-11-26 MED ORDER — FAMOTIDINE 20 MG PO TABS: 20.0000 mg | ORAL_TABLET | Freq: Two times a day (BID) | ORAL | 1 refills | Status: DC

## 2019-11-27 LAB — URINALYSIS
Bilirubin Urine: NEGATIVE
Glucose, UA: NEGATIVE
Hgb urine dipstick: NEGATIVE
Ketones, ur: NEGATIVE
Leukocytes,Ua: NEGATIVE
Nitrite: NEGATIVE
Protein, ur: NEGATIVE
Specific Gravity, Urine: 1.013 (ref 1.001–1.03)
pH: <=5 (ref 5.0–8.0)

## 2019-11-27 LAB — MAGNESIUM: Magnesium: 2.3 mg/dL (ref 1.5–2.5)

## 2019-11-30 DIAGNOSIS — R1084 Generalized abdominal pain: Secondary | ICD-10-CM | POA: Insufficient documentation

## 2019-11-30 DIAGNOSIS — R8290 Unspecified abnormal findings in urine: Secondary | ICD-10-CM | POA: Insufficient documentation

## 2019-11-30 NOTE — Progress Notes (Signed)
Virtual Visit via Telephone Note  I connected with Lekisha Tanvi Issac on 1/7/2021at  2:40 PM EST by telephone and verified that I am speaking with the correct person using two identifiers.DOB/address  Location: Patient: home Provider: office   I discussed the limitations, risks, security and privacy concerns of performing an evaluation and management service by telephone and the availability of in person appointments. I also discussed with the patient that there may be a patient responsible charge related to this service. The patient expressed understanding and agreed to proceed.   History of Present Illness:  pt with abdominal pain noted over the past few weeks-no stabbing. No fever, no bowel changes, no n/v Reviewed ER record Merrily Grandstaff 60 y old female presented to the urgent care with a complaint of lower abdominal pain that started  7 days ago.  Denies a precipitating event, or specific injury.  Patient localizes pain to her lower abdominal pain.  Describes it as constant and achy in character.  Has tried OTC medications without relief.  Symptoms are made worse with urination.  Reports similar symptoms in the past that resolved with treatment.  Denies fever, chills, appetite change, weight change, chest pain, nausea, vomiting, changes in bowel or bladder habits. Observations/Objective: none  Assessment and Plan:  1. Generalized abdominal pain pepcid-GERD-added, omeprazole-GERD - Magnesium-normal levels - Urinalysis Diet modification 2. Abnormal finding on urinalysis Pt requested Mag level-concern cause of abdominal pain - Magnesium - Urinalysis-normal Follow Up Instructions: If continued concerns-GI referral   I discussed the assessment and treatment plan with the patient. The patient was provided an opportunity to ask questions and all were answered. The patient agreed with the plan and demonstrated an understanding of the instructions.   The patient was advised to call back or  seek an in-person evaluation if the symptoms worsen or if the condition fails to improve as anticipated.  I provided 46minutes of non-face-to-face time during this encounter.   Shena Vinluan Hannah Beat, MD

## 2019-12-02 ENCOUNTER — Telehealth: Payer: Self-pay | Admitting: Neurology

## 2019-12-02 DIAGNOSIS — G4733 Obstructive sleep apnea (adult) (pediatric): Secondary | ICD-10-CM

## 2019-12-02 DIAGNOSIS — Z9989 Dependence on other enabling machines and devices: Secondary | ICD-10-CM

## 2019-12-02 NOTE — Telephone Encounter (Signed)
Please call pt: autoPap or CPAP therapy sometimes causes increased bloating but not sustained abdominal pain, this needs to be checked out if need be with a GI specialist.  For now, we can certainly reduce her AutoPap pressure settings and see if that helps, may be it is in part related to bloating.  I reviewed her compliance data which was very good for the past 30 days, average usage of 6-1/2 hours almost, residual AHI low at 0.3/h, average pressure 9.4 cm with a range of 5 to 12 cm.  We can reduce the range of her pressure to 5-10 for now and take it from there.

## 2019-12-02 NOTE — Telephone Encounter (Signed)
I reached out to the. Pt she reports increased abdominal pain over the last 2 weeks. Pt reports she has been to Urgent care and her PCP in regards to this issue.  Pt is concerned that the CPAP could be causing this increased abdominal pain.  I have printed the compliance data and have placed in MD's office to review.  Pt does report she has been taking omeprazole and has not noticed any benefit from this.

## 2019-12-02 NOTE — Addendum Note (Signed)
Addended by: Star Age on: 12/02/2019 02:55 PM   Modules accepted: Orders

## 2019-12-02 NOTE — Telephone Encounter (Signed)
Updated pressure change order was sent to [facility] for them to make the change

## 2019-12-02 NOTE — Telephone Encounter (Signed)
Pt is asking for a call from RN to discuss abdominal pain, possible gas from CPAP

## 2019-12-18 ENCOUNTER — Other Ambulatory Visit: Payer: Self-pay | Admitting: Family Medicine

## 2019-12-18 DIAGNOSIS — R8290 Unspecified abnormal findings in urine: Secondary | ICD-10-CM

## 2019-12-24 DIAGNOSIS — G4733 Obstructive sleep apnea (adult) (pediatric): Secondary | ICD-10-CM | POA: Diagnosis not present

## 2019-12-28 ENCOUNTER — Encounter: Payer: Self-pay | Admitting: Family Medicine

## 2019-12-28 ENCOUNTER — Telehealth (INDEPENDENT_AMBULATORY_CARE_PROVIDER_SITE_OTHER): Payer: Medicare Other | Admitting: Family Medicine

## 2019-12-28 DIAGNOSIS — Z9989 Dependence on other enabling machines and devices: Secondary | ICD-10-CM | POA: Diagnosis not present

## 2019-12-28 DIAGNOSIS — G4733 Obstructive sleep apnea (adult) (pediatric): Secondary | ICD-10-CM

## 2019-12-28 NOTE — Progress Notes (Addendum)
PATIENT: Robin Franklin DOB: [birth year]  REASON FOR VISIT: follow up HISTORY FROM: patient  Virtual Visit via Telephone Note  I connected with Aayushi Esaw Grandchild on 12/28/19 at  9:00 AM EST by telephone and verified that I am speaking with the correct person using two identifiers.   I discussed the limitations, risks, security and privacy concerns of performing an evaluation and management service by telephone and the availability of in person appointments. I also discussed with the patient that there may be a patient responsible charge related to this service. The patient expressed understanding and agreed to proceed.   History of Present Illness:  002/08/21 Robin Franklin is a 59 y.o. female here today for follow up for OSA recently started on CPAP. HST revealed moderate OSA with total AHI of 21.8/hr and O2 nadir of 80%. Hiyab is doing very well with CPAP therapy.  She reports that she is using her machine every night.  She denies any difficulty with her machine.  Compliance report dated 11/24/2019 through 12/23/2019 reveals that she used CPAP 29 of the last 30 nights for compliance of 97%.  28 nights she used CPAP greater than 4 hours for compliance of 93%.  Average usage was 6 hours and 37 minutes.  Residual AHI was 0.2 on 4 to 10 cm of water and an EPR of 3.  There was no significant leak noted.   History (copied from Dr Guadelupe Sabin note on 09/29/2019)  Dear Dr. Holly Bodily,   I saw your patient, Robin Franklin, upon your kind request to my sleep clinic today for initial consultation of her sleep disorder, in particular, concern for underlying obstructive sleep apnea.  The patient is unaccompanied today.  As you know, Ms. Heckaman is a 59 year old right-handed woman with an underlying medical history of breast cancer, hypothyroidism, status post left breast lumpectomy,  radiation and chemotherapy, anxiety, arthritis, and mildly overweight state, who reports snoring and occasional sleep disruption, she  has had infrequent episodes of waking up with a sense of choking or drowning.  She has not been noted to have any witnessed apneas per husband's feedback to her.  She is a restless sleeper.  She has residual back pain and also discomfort on the left side her lumpectomy.  Bedtime is around 1030 and rise time around 6.  She has had rare morning headaches.  She does report nocturia about twice per average night.  She is not aware of any family history of OSA but suspects that her 17 year old son could have sleep apnea.  She denies any significant daytime somnolence.  She no longer works.  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  She drinks caffeine and limitation, 1 serving of green tea per day on average.  She is a non-smoker and does not utilize alcohol.  Her weight has been more or less stable. I reviewed your office note from 09/16/2019.  Her Epworth sleepiness score is 5 out of 24, fatigue severity score is 12 out of 63.     Observations/Objective:  Generalized: Well developed, in no acute distress  Mentation: Alert oriented to time, place, history taking. Follows all commands speech   and language fluent   Assessment and Plan:  59 y.o. year old female  has a past medical history of Anxiety, Arthritis, Breast cancer ([place]), Family history of breast cancer, Headache(784.0), Hypothyroidism, Joint pain, Personal history of chemotherapy, Personal history of radiation therapy, and S/P radiation therapy (05/17/2015 through 06/24/2015 ).  here with    ICD-10-CM   1. OSA on CPAP  G47.33    Z99.89    Robin Franklin is doing very well with CPAP therapy.  Compliance report reveals excellent compliance.  She was encouraged to continue using CPAP nightly and for greater than 4 hours each night.  We have discussed results of home sleep study, role of CPAP therapy and risk of untreated sleep apnea.  She did have specific questions regarding  neurology's recommendation for Covid 19 vaccination.  She was advised that we do typically vaccination.  I have also encouraged her to reach out to her primary care provider for more information and evaluation for appropriateness of this vaccine.  She will follow-up in 6 months, sooner if needed.  She verbalizes understanding and agreement with this plan.   No orders of the defined types were placed in this encounter.   No orders of the defined types were placed in this encounter.    Follow Up Instructions:  I discussed the assessment and treatment plan with the patient. The patient was provided an opportunity to ask questions and all were answered. The patient agreed with the plan and demonstrated an understanding of the instructions.   The patient was advised to call back or seek an in-person evaluation if the symptoms worsen or if the condition fails to improve as anticipated.  I provided 20 minutes of non-face-to-face time during this encounter.  Patient is located at her place of residence during virtual visit.  Providers in the office.   Debbora Presto, NP   I reviewed the above note and documentation by the Nurse Practitioner and agree with the history, exam, assessment and plan as outlined above. I was available for consultation. Star Age, MD, PhD [facility])

## 2019-12-29 ENCOUNTER — Telehealth (INDEPENDENT_AMBULATORY_CARE_PROVIDER_SITE_OTHER): Payer: Medicare Other | Admitting: Family Medicine

## 2019-12-29 ENCOUNTER — Encounter: Payer: Self-pay | Admitting: Family Medicine

## 2019-12-29 ENCOUNTER — Other Ambulatory Visit: Payer: Self-pay

## 2019-12-29 VITALS — BP 111/72 | Ht 67.0 in | Wt 173.0 lb

## 2019-12-29 DIAGNOSIS — Z7189 Other specified counseling: Secondary | ICD-10-CM | POA: Insufficient documentation

## 2019-12-29 DIAGNOSIS — E039 Hypothyroidism, unspecified: Secondary | ICD-10-CM

## 2019-12-29 NOTE — Progress Notes (Signed)
Virtual Visit via Telephone Note  I connected with Robin Franklin on 12/29/19 at 11:20 AM EST by telephone and verified that I am speaking with the correct person using two identifiers.  Location: Patient: clinic Provider: clinic   I discussed the limitations, risks, security and privacy concerns of performing an evaluation and management service by telephone and the availability of in person appointments. I also discussed with the patient that there may be a patient responsible charge related to this service. The patient expressed understanding and agreed to proceed.   History of Present Illness: Concern for taking COVID 19 vaccine Hypothyroid-takes medication daily-50mg daily   Observations/Objective: labwork reviewed  Assessment and Plan: 1. Hypothyroidism, unspecified type TSH .51  2. Educated about COVID-19 virus infection D/w pt at length need for vaccine-when criteria is met per CDC guidelines recommend pt take vaccine. Currently 65+ and first responders  Follow Up Instructions:    I discussed the assessment and treatment plan with the patient. The patient was provided an opportunity to ask questions and all were answered. The patient agreed with the plan and demonstrated an understanding of the instructions.   The patient was advised to call back or seek an in-person evaluation if the symptoms worsen or if the condition fails to improve as anticipated.  I provided 20 minutes of non-face-to-face time during this encounter.   Jaianna Nicoll LHannah Beat MD

## 2020-01-02 ENCOUNTER — Other Ambulatory Visit: Payer: Self-pay | Admitting: Family Medicine

## 2020-01-02 DIAGNOSIS — R8290 Unspecified abnormal findings in urine: Secondary | ICD-10-CM

## 2020-01-02 NOTE — Patient Instructions (Signed)
Follow up in 6 months Continue regular follow up

## 2020-01-04 DIAGNOSIS — M5136 Other intervertebral disc degeneration, lumbar region: Secondary | ICD-10-CM | POA: Diagnosis not present

## 2020-01-21 DIAGNOSIS — G4733 Obstructive sleep apnea (adult) (pediatric): Secondary | ICD-10-CM | POA: Diagnosis not present

## 2020-01-25 DIAGNOSIS — G4733 Obstructive sleep apnea (adult) (pediatric): Secondary | ICD-10-CM | POA: Diagnosis not present

## 2020-01-29 DIAGNOSIS — M545 Low back pain: Secondary | ICD-10-CM | POA: Diagnosis not present

## 2020-01-29 DIAGNOSIS — M47816 Spondylosis without myelopathy or radiculopathy, lumbar region: Secondary | ICD-10-CM | POA: Diagnosis not present

## 2020-01-29 DIAGNOSIS — M7918 Myalgia, other site: Secondary | ICD-10-CM | POA: Diagnosis not present

## 2020-01-29 DIAGNOSIS — M5136 Other intervertebral disc degeneration, lumbar region: Secondary | ICD-10-CM | POA: Diagnosis not present

## 2020-01-30 ENCOUNTER — Other Ambulatory Visit: Payer: Self-pay | Admitting: Family Medicine

## 2020-01-30 DIAGNOSIS — G479 Sleep disorder, unspecified: Secondary | ICD-10-CM

## 2020-02-01 ENCOUNTER — Other Ambulatory Visit: Payer: Self-pay | Admitting: Emergency Medicine

## 2020-02-01 DIAGNOSIS — G479 Sleep disorder, unspecified: Secondary | ICD-10-CM

## 2020-02-11 ENCOUNTER — Ambulatory Visit: Payer: Medicare Other | Attending: Internal Medicine

## 2020-02-11 DIAGNOSIS — Z23 Encounter for immunization: Secondary | ICD-10-CM

## 2020-02-11 NOTE — Progress Notes (Signed)
   Covid-19 Vaccination Clinic  Name:  Danelle Zoellick    MRN: [id number] DOB: 05-24-1961  02/11/2020  Ms. Krolick was observed post Covid-19 immunization for 15 minutes without incident. She was provided with Vaccine Information Sheet and instruction to access the V-Safe system.   Ms. Dunford was instructed to call 911 with any severe reactions post vaccine: Marland Kitchen Difficulty breathing  . Swelling of face and throat  . A fast heartbeat  . A bad rash all over body  . Dizziness and weakness   Immunizations Administered    Name Date Dose VIS Date Route   Moderna COVID-19 Vaccine 02/11/2020  9:53 AM 0.5 mL 10/20/2019 Intramuscular   Manufacturer: Moderna   Lot: [id number]   [id number]

## 2020-02-21 DIAGNOSIS — G4733 Obstructive sleep apnea (adult) (pediatric): Secondary | ICD-10-CM | POA: Diagnosis not present

## 2020-03-15 ENCOUNTER — Ambulatory Visit: Payer: Medicare Other | Attending: Internal Medicine

## 2020-03-15 DIAGNOSIS — Z23 Encounter for immunization: Secondary | ICD-10-CM

## 2020-03-15 NOTE — Progress Notes (Signed)
   Covid-19 Vaccination Clinic  Name:  Robin Franklin    MRN: [id number] DOB: 1960/12/26  03/15/2020  Robin Franklin was observed post Covid-19 immunization for 15 minutes without incident. She was provided with Vaccine Information Sheet and instruction to access the V-Safe system.   Robin Franklin was instructed to call 911 with any severe reactions post vaccine: Marland Kitchen Difficulty breathing  . Swelling of face and throat  . A fast heartbeat  . A bad rash all over body  . Dizziness and weakness   Immunizations Administered    Name Date Dose VIS Date Route   Moderna COVID-19 Vaccine 03/15/2020  9:16 AM 0.5 mL 10/2019 Intramuscular   Manufacturer: Moderna   Lot: [id number]   [id number]

## 2020-03-21 ENCOUNTER — Ambulatory Visit: Payer: 59 | Admitting: Nutrition

## 2020-03-22 DIAGNOSIS — G4733 Obstructive sleep apnea (adult) (pediatric): Secondary | ICD-10-CM | POA: Diagnosis not present

## 2020-04-14 ENCOUNTER — Telehealth: Payer: Self-pay | Admitting: Family Medicine

## 2020-04-14 DIAGNOSIS — E039 Hypothyroidism, unspecified: Secondary | ICD-10-CM

## 2020-04-14 NOTE — Telephone Encounter (Signed)
Patient came into the office today requesting a refill on the following medication.  levothyroxine (SYNTHROID) 75 MCG tablet   [id number] - [place], [place] [address] [facility] [phone] (Phone) [phone] (Fax)

## 2020-04-19 MED ORDER — LEVOTHYROXINE SODIUM 75 MCG PO TABS: 75.0000 ug | ORAL_TABLET | Freq: Every day | ORAL | 1 refills | Status: DC

## 2020-04-22 DIAGNOSIS — G4733 Obstructive sleep apnea (adult) (pediatric): Secondary | ICD-10-CM | POA: Diagnosis not present

## 2020-04-25 DIAGNOSIS — G4733 Obstructive sleep apnea (adult) (pediatric): Secondary | ICD-10-CM | POA: Diagnosis not present

## 2020-05-11 ENCOUNTER — Ambulatory Visit (INDEPENDENT_AMBULATORY_CARE_PROVIDER_SITE_OTHER): Payer: Medicare Other | Admitting: Family Medicine

## 2020-05-11 ENCOUNTER — Other Ambulatory Visit: Payer: Self-pay

## 2020-05-11 ENCOUNTER — Encounter: Payer: Self-pay | Admitting: Family Medicine

## 2020-05-11 VITALS — BP 115/71 | HR 54 | Ht 66.25 in | Wt 177.4 lb

## 2020-05-11 DIAGNOSIS — G4730 Sleep apnea, unspecified: Secondary | ICD-10-CM

## 2020-05-11 DIAGNOSIS — E782 Mixed hyperlipidemia: Secondary | ICD-10-CM | POA: Diagnosis not present

## 2020-05-11 DIAGNOSIS — M5126 Other intervertebral disc displacement, lumbar region: Secondary | ICD-10-CM

## 2020-05-11 DIAGNOSIS — R7689 Other specified abnormal immunological findings in serum: Secondary | ICD-10-CM

## 2020-05-11 DIAGNOSIS — C50612 Malignant neoplasm of axillary tail of left female breast: Secondary | ICD-10-CM

## 2020-05-11 DIAGNOSIS — M2550 Pain in unspecified joint: Secondary | ICD-10-CM

## 2020-05-11 DIAGNOSIS — E039 Hypothyroidism, unspecified: Secondary | ICD-10-CM

## 2020-05-11 DIAGNOSIS — K219 Gastro-esophageal reflux disease without esophagitis: Secondary | ICD-10-CM | POA: Diagnosis not present

## 2020-05-11 DIAGNOSIS — M65341 Trigger finger, right ring finger: Secondary | ICD-10-CM

## 2020-05-11 DIAGNOSIS — T451X5A Adverse effect of antineoplastic and immunosuppressive drugs, initial encounter: Secondary | ICD-10-CM

## 2020-05-11 DIAGNOSIS — G620 Drug-induced polyneuropathy: Secondary | ICD-10-CM

## 2020-05-11 DIAGNOSIS — R768 Other specified abnormal immunological findings in serum: Secondary | ICD-10-CM

## 2020-05-11 DIAGNOSIS — E559 Vitamin D deficiency, unspecified: Secondary | ICD-10-CM

## 2020-05-11 DIAGNOSIS — M65342 Trigger finger, left ring finger: Secondary | ICD-10-CM

## 2020-05-11 DIAGNOSIS — F418 Other specified anxiety disorders: Secondary | ICD-10-CM

## 2020-05-11 DIAGNOSIS — Z171 Estrogen receptor negative status [ER-]: Secondary | ICD-10-CM

## 2020-05-11 DIAGNOSIS — L989 Disorder of the skin and subcutaneous tissue, unspecified: Secondary | ICD-10-CM

## 2020-05-11 MED ORDER — CYCLOBENZAPRINE HCL 10 MG PO TABS
10.0000 mg | ORAL_TABLET | Freq: Three times a day (TID) | ORAL | 3 refills | Status: DC | PRN
Start: 2020-05-11 — End: 2024-06-25

## 2020-05-11 MED ORDER — ALPRAZOLAM 0.5 MG PO TABS
0.5000 mg | ORAL_TABLET | Freq: Every evening | ORAL | 0 refills | Status: DC | PRN
Start: 2020-05-11 — End: 2020-11-09

## 2020-05-11 NOTE — Progress Notes (Signed)
Office Visit Note   Patient: Robin Franklin           Date of Birth: 1961-03-23           MRN: [id number] Visit Date: 05/11/2020 Requested by: Maryruth Hancock, MD [address] [place],  [place] [address] PCP: Maryruth Hancock, MD  Subjective: Chief Complaint  Patient presents with  . establish PCP    HPI: She is here to establish care.  Her son is a patient of mine.  She status post breast cancer treatment with surgery and chemotherapy 5 years ago.  She is now cancer free.  She has hypothyroidism which is well controlled with Synthroid 75 mcg daily.  She is due for labs to monitor this.  She has sleep apnea which is well controlled.  GERD is managed mostly with lifestyle changes.  She has omeprazole to use as needed but rarely uses it.  She has a history of situational anxiety for which she takes Xanax as needed.  Again, she rarely uses it.  Lately she has been having pain in both shoulders and both hands.  The shoulders hurt throughout the day, but the hands bother her mainly at night.  The fourth and fifth fingers get stuck in flexion sometimes.  This has been happening for couple months.  She also complains of chronic numbness in the toes of her right foot, primarily the third, and fourth toes.  She has a history of L4-5 disc protrusion per MRI scan in 2020 about a year ago.  She has been to a chiropractor, physical therapy, spine surgeon and had epidural injections.  She went to [place] for laser surgery.  None of this has helped.  She had nerve studies several years ago which were negative for neuropathy.  Denies any bowel or bladder dysfunction.  Her symptoms do not really change with position.  She wants to avoid spine surgery if possible.  She has a skin lesion on the left side of her face that she would like removed.  She has a history of positive ANA in 2017.  She went to a rheumatologist and other work-up was negative at that point.                ROS: No fevers or  chills.  All other systems were reviewed and are negative.  Objective: Vital Signs: BP 115/71   Pulse (!) 54   Ht 5' 6.25" (1.683 m)   Wt 177 lb 6.4 oz (80.5 kg)   LMP 08/09/2013   BMI 28.42 kg/m   Physical Exam:  General:  Alert and oriented, in no acute distress. Pulm:  Breathing unlabored. Psy:  Normal mood, congruent affect. Skin: There is a raised pigmented lesion on the left temple area. Neck: No thyromegaly or nodules. CV: Regular rate and rhythm without murmurs, rubs, or gallops.  No peripheral edema.  2+ radial and posterior tibial pulses. Lungs: Clear to auscultation throughout with no wheezing or areas of consolidation. Shoulders: Full range of motion, 5/5 rotator cuff strength throughout.  She does have some pain with strength testing. Hands: She has no triggering of the fingers today.  Slight tenderness near the A1 pulley of the fourth and fifth fingers of both hands. Low back: Straight leg raise negative, lower extremity strength and reflexes are still normal and equal with the exception of her right foot third and fourth toes which do not extend or separate length the left foot.   Imaging: No results  found.  Assessment & Plan: 1.  Visit to establish care. -She will probably be due for a bone density test at some point.  She has not had one yet.  2.  Hypothyroidism, clinically euthyroid -Labs to monitor.  Follow-up in 6 months.  3.  Status post breast cancer treatment, doing well.  4.  Bilateral fourth and fifth trigger fingers -We will try splinting at night.  5.  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  6.  Bilateral shoulder pain and other joint pain with history of positive ANA several years ago -Recheck today.     Procedures: No procedures performed  No notes on file     PMFS History: Patient Active Problem List   Diagnosis Date Noted  . Positive ANA (antinuclear antibody)  05/11/2020  . Educated about COVID-19 virus infection 12/29/2019  . Generalized abdominal pain 11/30/2019  . Abnormal finding on urinalysis 11/30/2019  . Sleep apnea in adult 10/29/2019  . Gastroesophageal reflux disease without esophagitis 08/04/2019  . Hyperlipidemia 08/04/2019  . Sleep disturbance 08/04/2019  . Hypothyroidism 10/09/2017  . Genetic testing 11/29/2015  . Family history of breast cancer   . 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 Chemotherapy-induced nausea 03/21/2015  . Back pain 03/21/2015  . Constipation 03/21/2015  . Watery eyes 02/21/2015  . Mucositis due to chemotherapy 02/21/2015  . 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 Anxiety   . Arthritis   . Breast cancer ([place])   . Family history of breast cancer   . Headache(784.0)   . Hypothyroidism   . Joint pain   . Personal history of chemotherapy   . Personal history of radiation therapy   . S/P radiation therapy 05/17/2015 through 06/24/2015    Left breast 5040 cGy in 28 sessions; left supraclavicular/axillary region 5040 cGy in 28 sessions     Family History  Problem Relation Age of Onset  . Breast cancer Mother 25  . Heart attack Father   . Heart attack Maternal Uncle   . Cancer Maternal Uncle        NOS  . Prostate cancer Maternal Uncle   . Breast cancer Cousin        dx in her 80s  . Colon cancer Neg Hx     Past Surgical History:  Procedure Laterality Date  . BREAST LUMPECTOMY Left 2015  . BREAST REDUCTION SURGERY    . CERVICAL CONE BIOPSY    . COLONOSCOPY  11/06/2011   Procedure: COLONOSCOPY;  Surgeon: Dorothyann Peng, MD;  [facility];  Service: Endoscopy;  Laterality: N/A;  11:30 AM  . cyst removed from neck    . REDUCTION MAMMAPLASTY Bilateral 2001  . STERIOD INJECTION     Social  History   Occupational History  . Not on file  Tobacco Use  . Smoking status: Never Smoker  . Smokeless tobacco: Never Used  Substance and Sexual Activity  . Alcohol use: No  . Drug use: No  . Sexual activity: Yes

## 2020-05-17 ENCOUNTER — Telehealth: Payer: Self-pay | Admitting: Family Medicine

## 2020-05-17 LAB — COMPREHENSIVE METABOLIC PANEL
AG Ratio: 1.8 (calc) (ref 1.0–2.5)
ALT: 12 U/L (ref 6–29)
AST: 14 U/L (ref 10–35)
Albumin: 4.6 g/dL (ref 3.6–5.1)
Alkaline phosphatase (APISO): 56 U/L (ref 37–153)
BUN: 11 mg/dL (ref 7–25)
CO2: 28 mmol/L (ref 20–32)
Calcium: 9.9 mg/dL (ref 8.6–10.4)
Chloride: 106 mmol/L (ref 98–110)
Creat: 0.83 mg/dL (ref 0.50–1.05)
Globulin: 2.6 g/dL (calc) (ref 1.9–3.7)
Glucose, Bld: 94 mg/dL (ref 65–99)
Potassium: 4.3 mmol/L (ref 3.5–5.3)
Sodium: 143 mmol/L (ref 135–146)
Total Bilirubin: 0.5 mg/dL (ref 0.2–1.2)
Total Protein: 7.2 g/dL (ref 6.1–8.1)

## 2020-05-17 LAB — CBC WITH DIFFERENTIAL/PLATELET
Absolute Monocytes: 300 cells/uL (ref 200–950)
Basophils Absolute: 30 cells/uL (ref 0–200)
Basophils Relative: 0.8 %
Eosinophils Absolute: 49 cells/uL (ref 15–500)
Eosinophils Relative: 1.3 %
HCT: 41.6 % (ref 35.0–45.0)
Hemoglobin: 13.4 g/dL (ref 11.7–15.5)
Lymphs Abs: 1596 cells/uL (ref 850–3900)
MCH: 27.9 pg (ref 27.0–33.0)
MCHC: 32.2 g/dL (ref 32.0–36.0)
MCV: 86.5 fL (ref 80.0–100.0)
MPV: 11.2 fL (ref 7.5–12.5)
Monocytes Relative: 7.9 %
Neutro Abs: 1824 cells/uL (ref 1500–7800)
Neutrophils Relative %: 48 %
Platelets: 197 10*3/uL (ref 140–400)
RBC: 4.81 10*6/uL (ref 3.80–5.10)
RDW: 12.7 % (ref 11.0–15.0)
Total Lymphocyte: 42 %
WBC: 3.8 10*3/uL (ref 3.8–10.8)

## 2020-05-17 LAB — C-REACTIVE PROTEIN: CRP: 0.6 mg/L (ref <8.0)

## 2020-05-17 LAB — RHEUMATOID FACTOR: Rheumatoid fact SerPl-aCnc: <14 IU/mL (ref <14)

## 2020-05-17 LAB — SEDIMENTATION RATE: Sed Rate: 2 mm/h (ref 0–30)

## 2020-05-17 LAB — THYROID PANEL WITH TSH
Free Thyroxine Index: 2.7 (ref 1.4–3.8)
T3 Uptake: 35 % (ref 22–35)
T4, Total: 7.7 ug/dL (ref 5.1–11.9)
TSH: 0.4 mIU/L (ref 0.40–4.50)

## 2020-05-17 LAB — VITAMIN D 25 HYDROXY (VIT D DEFICIENCY, FRACTURES): Vit D, 25-Hydroxy: 44 ng/mL (ref 30–100)

## 2020-05-17 LAB — CYCLIC CITRUL PEPTIDE ANTIBODY, IGG: Cyclic Citrullin Peptide Ab: <16 UNITS

## 2020-05-17 LAB — ANTI-NUCLEAR AB-TITER (ANA TITER): ANA Titer 1: 1:40 {titer} — ABNORMAL HIGH

## 2020-05-17 LAB — ANA: Anti Nuclear Antibody (ANA): POSITIVE — AB

## 2020-05-17 NOTE — Telephone Encounter (Signed)
Labs show:  ANA is positive again at a low titer (1:40).    Other rheumatology-related labs are normal.  Thyroid looks perfect.  All other labs look perfect.

## 2020-05-22 DIAGNOSIS — G4733 Obstructive sleep apnea (adult) (pediatric): Secondary | ICD-10-CM | POA: Diagnosis not present

## 2020-06-02 ENCOUNTER — Ambulatory Visit: Payer: Medicare Other | Admitting: Internal Medicine

## 2020-06-06 ENCOUNTER — Telehealth: Payer: Self-pay | Admitting: Family Medicine

## 2020-06-06 NOTE — Telephone Encounter (Signed)
Please advise 

## 2020-06-06 NOTE — Telephone Encounter (Signed)
No, I didn't have any other labs in mind.  Was there something else she wanted checked?

## 2020-06-06 NOTE — Telephone Encounter (Signed)
Patient called. Would like to know if she is suppose to come back in for blood work. Her call back number is [phone]

## 2020-06-06 NOTE — Telephone Encounter (Signed)
Left voice mail with his response. Advised her to either call back or send message if there is something else she would like to have checked?

## 2020-06-22 DIAGNOSIS — G4733 Obstructive sleep apnea (adult) (pediatric): Secondary | ICD-10-CM | POA: Diagnosis not present

## 2020-07-04 ENCOUNTER — Encounter: Payer: Self-pay | Admitting: Family Medicine

## 2020-07-04 ENCOUNTER — Other Ambulatory Visit: Payer: Self-pay

## 2020-07-04 ENCOUNTER — Ambulatory Visit (INDEPENDENT_AMBULATORY_CARE_PROVIDER_SITE_OTHER): Payer: Medicare Other | Admitting: Family Medicine

## 2020-07-04 DIAGNOSIS — D259 Leiomyoma of uterus, unspecified: Secondary | ICD-10-CM | POA: Insufficient documentation

## 2020-07-04 DIAGNOSIS — R490 Dysphonia: Secondary | ICD-10-CM

## 2020-07-04 DIAGNOSIS — N951 Menopausal and female climacteric states: Secondary | ICD-10-CM | POA: Insufficient documentation

## 2020-07-04 DIAGNOSIS — N63 Unspecified lump in unspecified breast: Secondary | ICD-10-CM

## 2020-07-04 DIAGNOSIS — N926 Irregular menstruation, unspecified: Secondary | ICD-10-CM | POA: Insufficient documentation

## 2020-07-04 NOTE — Progress Notes (Signed)
Office Visit Note   Patient: Robin Franklin           Date of Birth: 11-Oct-1961           MRN: [id number] Visit Date: 07/04/2020 Requested by: Maryruth Hancock, MD [place],  [place] [address] PCP: Eunice Blase, MD  Subjective: Chief Complaint  Patient presents with   knot upper left chest - found 2 weeks ago, no pain   throat irritation    HPI: She is here with a left chest wall nodule.  About 2 weeks ago she noticed a small nodule just below the clavicle in the anterior left chest.  It has not gone away.  It does not hurt.  She has a history of lumpectomy.  Denies fevers or chills, denies unintentional weight change.  Appetite is normal.  She also notes that she has had chronic problems with her voice for the past couple years since undergoing her treatments.  She had a laryngoscopy done which was unremarkable per her report.  This affects her singing and it is very distressing to her.              ROS:   All other systems were reviewed and are negative.  Objective: Vital Signs: LMP 08/09/2013   Physical Exam:  General:  Alert and oriented, in no acute distress. Pulm:  Breathing unlabored. Psy:  Normal mood, congruent affect. Skin: There is no dimpling, no rash. Left chest wall: Just below the clavicle there is a subcutaneous small nodule which is nontender.  Imaging: No results found.  Assessment & Plan: 1.  Left chest wall nodule -We will schedule an ultrasound to evaluate.  2.  Chronic hoarseness -Speech therapy evaluation and treatment.     Procedures: No procedures performed  No notes on file     PMFS History: Patient Active Problem List   Diagnosis Date Noted   Irregular periods 07/04/2020   Menopausal symptom 07/04/2020   Uterine leiomyoma 07/04/2020   Positive ANA (antinuclear antibody) 05/11/2020   Situational anxiety 05/11/2020   Educated about COVID-19 virus infection 12/29/2019   Generalized abdominal pain 11/30/2019    Abnormal finding on urinalysis 11/30/2019   Pain of left hand 10/30/2019   Sleep apnea in adult 10/29/2019   Gastroesophageal reflux disease without esophagitis 08/04/2019   Hyperlipidemia 08/04/2019   Sleep disturbance 08/04/2019   Hypothyroidism 10/09/2017   History of malignant neoplasm of breast 06/14/2016   Genetic testing 11/29/2015   Family history of breast cancer    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   Chemotherapy-induced nausea 03/21/2015   Back pain 03/21/2015   Constipation 03/21/2015   Watery eyes 02/21/2015   Mucositis due to chemotherapy 02/21/2015   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, estrogen receptor negative ([place]) 11/28/2014   Lumbar radiculopathy 05/26/2012   Cervical intraepithelial neoplasia grade 1 11/19/2002   Past Medical History:  Diagnosis Date   Anxiety    Arthritis    Breast cancer ([place])    Family history of breast cancer    Headache(784.0)    Hypothyroidism    Joint pain    Personal history of chemotherapy    Personal history of radiation therapy    S/P radiation therapy 05/17/2015 through 06/24/2015    Left breast 5040 cGy in 28 sessions; left supraclavicular/axillary region 5040 cGy in 28 sessions     Family History  Problem Relation Age of Onset   Breast cancer Mother 60  Heart attack Father    Heart attack Maternal Uncle    Cancer Maternal Uncle        NOS   Prostate cancer Maternal Uncle    Breast cancer Cousin        dx in her 37s   Colon cancer Neg Hx     Past Surgical History:  Procedure Laterality Date   BREAST LUMPECTOMY Left 2015   BREAST REDUCTION SURGERY     CERVICAL CONE BIOPSY     COLONOSCOPY  11/06/2011   Procedure: COLONOSCOPY;  Surgeon: Dorothyann Peng, MD;  [facility];  Service: Endoscopy;  Laterality: N/A;  11:30 AM   cyst removed from neck      REDUCTION MAMMAPLASTY Bilateral 2001   STERIOD INJECTION     Social History   Occupational History   Not on file  Tobacco Use   Smoking status: Never Smoker   Smokeless tobacco: Never Used  Substance and Sexual Activity   Alcohol use: No   Drug use: No   Sexual activity: Yes

## 2020-07-13 ENCOUNTER — Other Ambulatory Visit: Payer: Self-pay | Admitting: Family Medicine

## 2020-07-13 DIAGNOSIS — M7989 Other specified soft tissue disorders: Secondary | ICD-10-CM

## 2020-07-13 DIAGNOSIS — R222 Localized swelling, mass and lump, trunk: Secondary | ICD-10-CM

## 2020-07-14 ENCOUNTER — Ambulatory Visit
Admission: RE | Admit: 2020-07-14 | Discharge: 2020-07-14 | Disposition: A | Discharge status: Home / Self Care | Payer: Medicare Other | Source: Ambulatory Visit | Attending: Family Medicine | Admitting: Family Medicine

## 2020-07-14 DIAGNOSIS — M7989 Other specified soft tissue disorders: Secondary | ICD-10-CM

## 2020-07-14 DIAGNOSIS — R222 Localized swelling, mass and lump, trunk: Secondary | ICD-10-CM

## 2020-07-14 DIAGNOSIS — Z853 Personal history of malignant neoplasm of breast: Secondary | ICD-10-CM | POA: Diagnosis not present

## 2020-07-14 DIAGNOSIS — E041 Nontoxic single thyroid nodule: Secondary | ICD-10-CM | POA: Diagnosis not present

## 2020-07-15 ENCOUNTER — Telehealth: Payer: Self-pay | Admitting: Family Medicine

## 2020-07-15 DIAGNOSIS — R222 Localized swelling, mass and lump, trunk: Secondary | ICD-10-CM

## 2020-07-15 DIAGNOSIS — R0782 Intercostal pain: Secondary | ICD-10-CM

## 2020-07-15 NOTE — Telephone Encounter (Signed)
I spoke to radiologist.  Recommendation is for a chest CT with contrast.  If anything is suspicious on CT, then will have interventional radiology do a biopsy.

## 2020-07-23 DIAGNOSIS — G4733 Obstructive sleep apnea (adult) (pediatric): Secondary | ICD-10-CM | POA: Diagnosis not present

## 2020-07-26 DIAGNOSIS — G4733 Obstructive sleep apnea (adult) (pediatric): Secondary | ICD-10-CM | POA: Diagnosis not present

## 2020-07-27 ENCOUNTER — Ambulatory Visit
Admission: RE | Admit: 2020-07-27 | Discharge: 2020-07-27 | Disposition: A | Discharge status: Home / Self Care | Payer: Medicare Other | Source: Ambulatory Visit | Attending: Family Medicine | Admitting: Family Medicine

## 2020-07-27 ENCOUNTER — Telehealth: Payer: Self-pay | Admitting: Family Medicine

## 2020-07-27 ENCOUNTER — Other Ambulatory Visit: Payer: Self-pay

## 2020-07-27 DIAGNOSIS — R0782 Intercostal pain: Secondary | ICD-10-CM

## 2020-07-27 DIAGNOSIS — Z853 Personal history of malignant neoplasm of breast: Secondary | ICD-10-CM | POA: Diagnosis not present

## 2020-07-27 DIAGNOSIS — R918 Other nonspecific abnormal finding of lung field: Secondary | ICD-10-CM | POA: Diagnosis not present

## 2020-07-27 DIAGNOSIS — R0789 Other chest pain: Secondary | ICD-10-CM | POA: Diagnosis not present

## 2020-07-27 MED ORDER — IOPAMIDOL (ISOVUE-300) INJECTION 61%
75.0000 mL | Freq: Once | INTRAVENOUS | Status: AC | PRN
  Administered 2020-07-27: 75 mL via INTRAVENOUS

## 2020-07-27 NOTE — Telephone Encounter (Signed)
CT scan does not show a nodule.

## 2020-08-15 ENCOUNTER — Encounter (HOSPITAL_COMMUNITY): Payer: Self-pay | Admitting: Speech Pathology

## 2020-08-15 ENCOUNTER — Other Ambulatory Visit: Payer: Self-pay

## 2020-08-15 ENCOUNTER — Ambulatory Visit (HOSPITAL_COMMUNITY): Payer: Medicare Other | Attending: Family Medicine | Admitting: Speech Pathology

## 2020-08-15 DIAGNOSIS — R490 Dysphonia: Secondary | ICD-10-CM

## 2020-08-15 NOTE — Therapy (Signed)
[facility] [place], [place], [address] Phone: [phone]   Fax:  [phone]  Speech Language Pathology Evaluation/VOICE  Patient Details  Name: Robin Franklin MRN: [id number] Date of Birth: 1961-08-30 Referring Provider (SLP): Eunice Blase, MD   Encounter Date: 08/15/2020   End of Session - 08/15/20 1135    Visit Number 1    Number of Visits 1    Authorization Type [employer] Medicare    SLP Start Time 0908    SLP Stop Time  5701    SLP Time Calculation (min) 47 min    Activity Tolerance Patient tolerated treatment well           Past Medical History:  Diagnosis Date  . Anxiety   . Arthritis   . Breast cancer ([place])   . Family history of breast cancer   . Headache(784.0)   . Hypothyroidism   . Joint pain   . Personal history of chemotherapy   . Personal history of radiation therapy   . S/P radiation therapy 05/17/2015 through 06/24/2015    Left breast 5040 cGy in 28 sessions; left supraclavicular/axillary region 5040 cGy in 28 sessions     Past Surgical History:  Procedure Laterality Date  . BREAST LUMPECTOMY Left 2015  . BREAST REDUCTION SURGERY    . CERVICAL CONE BIOPSY    . COLONOSCOPY  11/06/2011   Procedure: COLONOSCOPY;  Surgeon: Dorothyann Peng, MD;  [facility];  Service: Endoscopy;  Laterality: N/A;  11:30 AM  . cyst removed from neck    . REDUCTION MAMMAPLASTY Bilateral 2001  . STERIOD INJECTION      There were no vitals filed for this visit.   Subjective Assessment - 08/15/20 1134    Currently in Pain? No/denies              SLP Evaluation [facility] - 08/15/20 0001      SLP Visit Information   SLP Received On 08/15/20    Referring Provider (SLP) Eunice Blase, MD    Medical Diagnosis hoarseness      Subjective   Subjective "I feel like my singing voice has been different ever since my radiation treatment  in 2016."    Patient/Family Stated Goal Improve singing voice      General Information   HPI Robin Franklin is a 59 yo female who was referred for a voice evaluation by Dr. Eunice Blase, her PCP, due to Pt reports of hoarseness when singing. She reportedly was seen by ENT in 2019 and was told that her laryngoscopy was normal, however these records can not be located in [place]. She indicates that her singing voice has been different since she was treated with radiation (Left breast 5040 cGy in 28 sessions; left supraclavicular/axillary region 5040 cGy in 28 sessions) in 2016. She did play a recording of her voice for me from 2019, which she classified as normal/good. She has sleep apnea and uses a CPAP, takes a thyroid medication, uses Flonase, and occasionally takes omeprazole for reflux.    Behavioral/Cognition alert and cooperative    Mobility Status ambulatory      Balance Screen   Has the patient fallen in the past 6 months No    Has the patient had a decrease in activity level because of a fear of falling?  No    Is the patient reluctant to leave their home because of a fear of falling?  No  Prior Functional Status   Cognitive/Linguistic Baseline Within functional limits      Cognition   Overall Cognitive Status Within Functional Limits for tasks assessed      Auditory Comprehension   Overall Auditory Comprehension Appears within functional limits for tasks assessed      Visual Recognition/Discrimination   Discrimination Within Function Limits      Reading Comprehension   Reading Status Not tested      Verbal Expression   Overall Verbal Expression Appears within functional limits for tasks assessed      Oral Motor/Sensory Function   Overall Oral Motor/Sensory Function Appears within functional limits for tasks assessed      Motor Speech   Overall Motor Speech Appears within functional limits for tasks assessed    Respiration Within functional limits    Phonation Normal;Hoarse    Pt reports intermittent hoarseness   Resonance Within functional limits    Articulation Within functional limitis    Intelligibility Intelligible    Motor Planning Witnin functional limits    Motor Speech Errors Not applicable    Phonation Impaired    Vocal Abuses Habitual Cough/Throat Clear;Vocal Fold Dehydration    Volume Appropriate    Pitch Appropriate      Standardized Assessments   Standardized Assessments  Other Assessment           The Voice Handicap Index-10 (VHI-10) was administered. This survey is a series of questions targeting the patient's perception of his/her own voice using a scale of 0-4 (0=Never, 4=Always). Score greater than 11 is abnormal. My voice makes it difficult for people to hear me. 0  People have difficulty understanding me in a noisy room. 1 My voice difficulties restrict personal and social life. 0 I feel left out of conversations because of my voice. 0  My voice problem causes me to lose income. 0 I feel as though I have to strain to produce voice. 2  The clarity of my voice is unpredictable. 2 My voice problem upsets me. 2 My voice makes me feel handicapped. 4  People ask, "What's wrong with your voice?" 2  TOTAL SCORE: 11 [ ]  Abnormal (raw score >11)   Reflux Symptom Index Hoarseness or a problem with your voice 3 Clearing your throat 2 Excess throat mucous or postnasal drip 2 Difficulty swallowing food, liquids, or pills 5 Coughing after you eat or after lying down 0 Breathing difficulties or choking episodes 0 Troublesome or annoying cough 0 Sensation of something sticking in your throat or a lump in your throat 4 Heartburn, chest pain, indigestion, or stomach acid coming up 0 RSI: 16 . Normative data suggests that an RSI of greater than or equal to 13 is clinically significant . Therefore, an RSI > 13 may be indicative of significant reflux disease.  Respiration Maximum loudness (sustained phonation): 94 dB SPL  Loudness in  connected speech (spontaneous speech): average of 71 dB SPL;  Maximum phonation duration: 12 seconds; WFL [Minimum: young female: 4 s, young fem: 7 s, elderly female: 38 s, elderly fem: 10 s ([place], [place], [place] 1987)]  Connected speech: reduced breath support  Resonance Conversational speech: WFL  Articulation  Alternating motion rates (AMR): WFL Sequential motion rates (SMR): WFL Repetition of words/sentences: WFL Connected speech: WFL  [place]  Speaking rate (conversation): WFL Intonation: WFL Stress: WFL  Naturalness: WFL Intelligibility: Patient was 100% intelligible in a quiet room with an attentive listener.     Plan - 08/15/20 1136    Clinical  Impression Statement Pt presents with mild vocal hoarseness and reduced pitch ROM from her norm, more evident when signing. She indicates voice changes impacting singing (she sings at home, church, choir) since radiation completed for breast CA in 2016. She indicates that she is more comfortable singing in the lower registers and is no longer able to "do a run". She reports that her speaking voice does not seem to be impacted and that the hoarseness noted when singing fluctuates (worse in colder weather). She uses a CPAP machine nightly (since December 2020), takes omeprazole prn, takes thyroid medication, limited caffeine intake, 48 oz water intake daily, occasional throat clearing exhibited, and she occasionally uses an herbal "Singer's Throat Soothing Spray". VHI-10= 11 and RSI =16. She was able to sustain /s/ for 25 seconds, /z/ for 12.07, and /a/ for 10.08 seconds indicating a discrepancy and need for further evaluation. Pt states she was seen by ENT in [place] in 2019, however cannot remember who she saw and no record have been located in [place]. Recommend that Pt be seen at [facility] [phone]) for evaluation by a laryngologist and voice specialist Bubba Camp, SLP). SLP can then be seen here for treatment if the  team at [facility] deems beneficial. She was given breathing exercises and information regarding reflux precautions to review. She was also given the contact number for [facility], however she may need a referral from her PCP. SLP will message her PCP.    Consulted and Agree with Plan of Care Patient           Patient will benefit from skilled therapeutic intervention in order to improve the following deficits and impairments:   Hoarseness    Problem List Patient Active Problem List   Diagnosis Date Noted  . Irregular periods 07/04/2020  . Menopausal symptom 07/04/2020  . Uterine leiomyoma 07/04/2020  . Positive ANA (antinuclear antibody) 05/11/2020  . Situational anxiety 05/11/2020  . Educated about COVID-19 virus infection 12/29/2019  . Generalized abdominal pain 11/30/2019  . Abnormal finding on urinalysis 11/30/2019  . Pain of left hand 10/30/2019  . Sleep apnea in adult 10/29/2019  . Gastroesophageal reflux disease without esophagitis 08/04/2019  . Hyperlipidemia 08/04/2019  . Sleep disturbance 08/04/2019  . Hypothyroidism 10/09/2017  . History of malignant neoplasm of breast 06/14/2016  . Genetic testing 11/29/2015  . Family history of breast cancer   . 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 Chemotherapy-induced nausea 03/21/2015  . Back pain 03/21/2015  . Constipation 03/21/2015  . Watery eyes 02/21/2015  . Mucositis due to chemotherapy 02/21/2015  . 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 Lumbar radiculopathy 05/26/2012  . Cervical intraepithelial neoplasia grade 1 11/19/2002   Thank you,  Genene Churn, [place]  [facility] 08/15/2020, 11:37 AM  [facility] [address] [place], [place], [address] Phone: [phone]   Fax:  [phone]  Name: Robin Franklin MRN: [id number] Date of Birth: 04/26/1961

## 2020-08-15 NOTE — Addendum Note (Signed)
Addended by: Hortencia Pilar on: 08/15/2020 02:58 PM   Modules accepted: Orders

## 2020-08-15 NOTE — Addendum Note (Signed)
Addended by: Ephraim Hamburger on: 08/15/2020 12:52 PM   Modules accepted: Orders

## 2020-08-22 DIAGNOSIS — G4733 Obstructive sleep apnea (adult) (pediatric): Secondary | ICD-10-CM | POA: Diagnosis not present

## 2020-08-30 DIAGNOSIS — G4733 Obstructive sleep apnea (adult) (pediatric): Secondary | ICD-10-CM | POA: Diagnosis not present

## 2020-09-12 ENCOUNTER — Encounter: Payer: Self-pay | Admitting: Dermatology

## 2020-09-12 ENCOUNTER — Other Ambulatory Visit: Payer: Self-pay

## 2020-09-12 ENCOUNTER — Ambulatory Visit: Payer: Medicare Other | Admitting: Dermatology

## 2020-09-12 DIAGNOSIS — L821 Other seborrheic keratosis: Secondary | ICD-10-CM | POA: Diagnosis not present

## 2020-09-12 DIAGNOSIS — L729 Follicular cyst of the skin and subcutaneous tissue, unspecified: Secondary | ICD-10-CM

## 2020-09-12 DIAGNOSIS — Z1283 Encounter for screening for malignant neoplasm of skin: Secondary | ICD-10-CM | POA: Diagnosis not present

## 2020-09-12 DIAGNOSIS — D229 Melanocytic nevi, unspecified: Secondary | ICD-10-CM

## 2020-09-14 ENCOUNTER — Encounter: Payer: Self-pay | Admitting: Dermatology

## 2020-09-14 NOTE — Progress Notes (Signed)
   New Patient   Subjective  Robin Franklin is a 59 y.o. female who presents for the following: Skin Problem (2020 NEW SPOT LEFT SIDEBURN [place] DERM STATED IT WAS NORMAL) and Seborrheic Keratosis (LEFT BREAST X 5 YEARS SIZE STABLE).  Growth Location: Left back cheek Duration:  Quality:  Associated Signs/Symptoms: Modifying Factors:  Severity:  Timing: Context:    The following portions of the chart were reviewed this encounter and updated as appropriate:     Objective  Well appearing patient in no apparent distress; mood and affect are within normal limits.  A focused examination was performed including Head, neck, chest.. Relevant physical exam findings are noted in the Assessment and Plan.   Assessment & Plan  Cyst of skin Left Zygomatic Area  Lesion is benign and does not need removal unless it bothers the patient.  Surgical risks reviewed.  She will call to schedule appointment.   Screening for malignant neoplasm of skin Mid Back  No skin cancers or atypical moles.   Seborrheic keratosis Left Breast  Leave if stable.  No treatment needed. Photo taken to watch for any changes.

## 2020-09-22 DIAGNOSIS — G4733 Obstructive sleep apnea (adult) (pediatric): Secondary | ICD-10-CM | POA: Diagnosis not present

## 2020-10-14 ENCOUNTER — Other Ambulatory Visit: Payer: Self-pay | Admitting: Family Medicine

## 2020-10-14 DIAGNOSIS — E039 Hypothyroidism, unspecified: Secondary | ICD-10-CM

## 2020-10-19 ENCOUNTER — Telehealth: Payer: Self-pay | Admitting: Family Medicine

## 2020-10-19 NOTE — Telephone Encounter (Signed)
Sent!

## 2020-10-19 NOTE — Telephone Encounter (Signed)
Called and left voice message that this was done.

## 2020-10-19 NOTE — Telephone Encounter (Signed)
Patient called needing Rx refilled Lovothyroxine. The number to contact patient is [phone]

## 2020-10-19 NOTE — Telephone Encounter (Signed)
Please advise 

## 2020-10-22 DIAGNOSIS — G4733 Obstructive sleep apnea (adult) (pediatric): Secondary | ICD-10-CM | POA: Diagnosis not present

## 2020-10-24 ENCOUNTER — Other Ambulatory Visit: Payer: Self-pay | Admitting: Oncology

## 2020-10-24 ENCOUNTER — Other Ambulatory Visit: Payer: Self-pay | Admitting: Family Medicine

## 2020-10-24 DIAGNOSIS — Z9889 Other specified postprocedural states: Secondary | ICD-10-CM

## 2020-11-09 ENCOUNTER — Ambulatory Visit (INDEPENDENT_AMBULATORY_CARE_PROVIDER_SITE_OTHER): Payer: Medicare Other | Admitting: Family Medicine

## 2020-11-09 ENCOUNTER — Encounter: Payer: Self-pay | Admitting: Family Medicine

## 2020-11-09 ENCOUNTER — Other Ambulatory Visit: Payer: Self-pay

## 2020-11-09 VITALS — BP 103/68 | HR 66 | Ht 67.5 in | Wt 167.8 lb

## 2020-11-09 DIAGNOSIS — N63 Unspecified lump in unspecified breast: Secondary | ICD-10-CM

## 2020-11-09 DIAGNOSIS — H9191 Unspecified hearing loss, right ear: Secondary | ICD-10-CM | POA: Diagnosis not present

## 2020-11-09 DIAGNOSIS — E039 Hypothyroidism, unspecified: Secondary | ICD-10-CM | POA: Diagnosis not present

## 2020-11-09 DIAGNOSIS — E559 Vitamin D deficiency, unspecified: Secondary | ICD-10-CM | POA: Diagnosis not present

## 2020-11-09 DIAGNOSIS — M545 Low back pain, unspecified: Secondary | ICD-10-CM | POA: Diagnosis not present

## 2020-11-09 MED ORDER — ALPRAZOLAM 0.5 MG PO TABS: 0.5000 mg | ORAL_TABLET | Freq: Every evening | ORAL | 1 refills | Status: DC | PRN

## 2020-11-09 NOTE — Progress Notes (Signed)
Office Visit Note   Patient: Robin Franklin           Date of Birth: Apr 05, 1961           MRN: [id number] Visit Date: 11/09/2020 Requested by: Maryruth Hancock, MD [address] [place],  [place] [address] PCP: Eunice Blase, MD  Subjective: Chief Complaint  Patient presents with  . Lower Back - Pain    Pain flared up this week. Wearing a light back support and a lidocaine patch -- not helping much.  . 6 months f/u on hypothyroidism and vitamin D    Recheck of knot left upper chest - "can still feel that it's there, but not as much as before (at last visit)"    HPI: She is here for routine monitoring of hypothyroidism and vitamin D deficiency.  Feeling well from a thyroid standpoint, not having any symptoms on Synthroid 75 mcg daily.  She is taking vitamin D3 at 5000 IU daily.  She is still noticing the left chest nodule that was evaluated by ultrasound and CT.  She is scheduled for her annual mammogram next month.  She needs a refill of Xanax.  She uses it very sparingly.  Recently her low back flared up.  No sciatica symptoms.  In the past she has done well with intramuscular steroid injection and she requests one today.  She thinks it flared up because she has not been walking for exercise like usual.                ROS:   All other systems were reviewed and are negative.  Objective: Vital Signs: BP 103/68   Pulse 66   Ht 5' 7.5" (1.715 m)   Wt 167 lb 12.8 oz (76.1 kg)   LMP 08/09/2013   BMI 25.89 kg/m   Physical Exam:  General:  Alert and oriented, in no acute distress. Pulm:  Breathing unlabored. Psy:  Normal mood, congruent affect. HEENT: Ear canals are clear with normal-appearing tympanic membranes. Neck: No thyromegaly or nodules. Left upper outer chest wall: Just below the clavicle there is slight nodularity. Low back: She is tender across the L5-S1 area.  Negative bilateral straight leg raise, lower extremity strength and reflexes are normal.    Imaging: No  results found.  Assessment & Plan: 1.  Hypothyroidism, clinically euthyroid -Labs today, follow-up in 6 months.  2.  Vitamin D deficiency -Recheck levels today.  3.  Left chest wall nodule, probably lymph node -Proceed with mammogram next month as scheduled.  4.  Low back pain -Intramuscular steroid injection today.  Resume walking for exercise.  Could refer for epidural or facet injections if symptoms persist.  5.  Hearing trouble right ear -Referral for audiology evaluation.     Procedures: No procedures performed        PMFS History: Patient Active Problem List   Diagnosis Date Noted  . Vitamin D deficiency 11/09/2020  . Irregular periods 07/04/2020  . Menopausal symptom 07/04/2020  . Uterine leiomyoma 07/04/2020  . Positive ANA (antinuclear antibody) 05/11/2020  . Situational anxiety 05/11/2020  . Educated about COVID-19 virus infection 12/29/2019  . Generalized abdominal pain 11/30/2019  . Abnormal finding on urinalysis 11/30/2019  . Pain of left hand 10/30/2019  . Sleep apnea in adult 10/29/2019  . Gastroesophageal reflux disease without esophagitis 08/04/2019  . Hyperlipidemia 08/04/2019  . Sleep disturbance 08/04/2019  . Hypothyroidism 10/09/2017  . History of malignant neoplasm of breast 06/14/2016  . Genetic testing  11/29/2015  . Family history of breast cancer   . 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 Chemotherapy-induced nausea 03/21/2015  . Back pain 03/21/2015  . Constipation 03/21/2015  . Watery eyes 02/21/2015  . Mucositis due to chemotherapy 02/21/2015  . 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 Lumbar radiculopathy 05/26/2012  . Cervical intraepithelial neoplasia grade 1 11/19/2002   Past Medical History:  Diagnosis Date  . Anxiety   . Arthritis   . Breast cancer ([place])   . Family history of breast cancer   . Headache(784.0)   . Hypothyroidism   . Joint  pain   . Personal history of chemotherapy   . Personal history of radiation therapy   . S/P radiation therapy 05/17/2015 through 06/24/2015    Left breast 5040 cGy in 28 sessions; left supraclavicular/axillary region 5040 cGy in 28 sessions     Family History  Problem Relation Age of Onset  . Breast cancer Mother 28  . Heart attack Father   . Heart attack Maternal Uncle   . Cancer Maternal Uncle        NOS  . Prostate cancer Maternal Uncle   . Breast cancer Cousin        dx in her 36s  . Colon cancer Neg Hx     Past Surgical History:  Procedure Laterality Date  . BREAST LUMPECTOMY Left 2015  . BREAST REDUCTION SURGERY    . CERVICAL CONE BIOPSY    . COLONOSCOPY  11/06/2011   Procedure: COLONOSCOPY;  Surgeon: Dorothyann Peng, MD;  Location: [facility];  Service: Endoscopy;  Laterality: N/A;  11:30 AM  . cyst removed from neck    . REDUCTION MAMMAPLASTY Bilateral 2001  . STERIOD INJECTION     Social History   Occupational History  . Not on file  Tobacco Use  . Smoking status: Never Smoker  . Smokeless tobacco: Never Used  Vaping Use  . Vaping Use: Never used  Substance and Sexual Activity  . Alcohol use: No  . Drug use: No  . Sexual activity: Yes

## 2020-11-09 NOTE — Progress Notes (Signed)
Decadron 4 mg (1 cc of 4 mg/mL) given IM left dorsogluteal area.  Betamethasone 6 mg (1 cc of 6 mg/mL) given IM right dorsogluteal area.

## 2020-11-10 ENCOUNTER — Telehealth: Payer: Self-pay | Admitting: Family Medicine

## 2020-11-10 LAB — THYROID PANEL WITH TSH
Free Thyroxine Index: 2.6 (ref 1.4–3.8)
T3 Uptake: 33 % (ref 22–35)
T4, Total: 7.8 ug/dL (ref 5.1–11.9)
TSH: 1.12 mIU/L (ref 0.40–4.50)

## 2020-11-10 LAB — VITAMIN D 25 HYDROXY (VIT D DEFICIENCY, FRACTURES): Vit D, 25-Hydroxy: 42 ng/mL (ref 30–100)

## 2020-11-10 NOTE — Telephone Encounter (Signed)
Thyroid looks perfect.  Could take an extra 2,000 IU daily of vitamin D3.

## 2020-11-13 MED ORDER — MELOXICAM 7.5 MG PO TABS: 7.5000 mg | ORAL_TABLET | Freq: Two times a day (BID) | ORAL | 3 refills | Status: DC | PRN

## 2020-11-13 NOTE — Addendum Note (Signed)
Addended by: Hortencia Pilar on: 11/13/2020 07:22 AM   Modules accepted: Orders

## 2020-11-22 ENCOUNTER — Other Ambulatory Visit: Payer: Self-pay

## 2020-11-22 ENCOUNTER — Ambulatory Visit: Payer: Medicare HMO | Attending: Family Medicine | Admitting: Audiology

## 2020-11-22 DIAGNOSIS — H9042 Sensorineural hearing loss, unilateral, left ear, with unrestricted hearing on the contralateral side: Secondary | ICD-10-CM | POA: Insufficient documentation

## 2020-11-22 DIAGNOSIS — G4733 Obstructive sleep apnea (adult) (pediatric): Secondary | ICD-10-CM | POA: Diagnosis not present

## 2020-11-22 NOTE — Procedures (Signed)
  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 She denies tinnitus, aural fullness, and otalgia. She reports dizziness which she describes at "lightheadedness." [place] reports increased difficulty hearing her own voice when she is singing with a group of singers. She denies other communication difficulties.   Evaluation:   Otoscopy showed a clear view of the tympanic membranes, bilaterally  Tympanometry results were consistent with normal middle ear pressure and normal tympanic membrane mobility, bilaterally.   Audiometric testing was completed using Conventional Audiometry techniques with insert earphones and TDH headphones. Test results are consistent with normal hearing sensitivity in both ears with the exception of a mild hearing loss at 250 Hz and 3000 Hz in the left ear. Speech Recognition Thresholds were obtained at 20 dB HL in the right ear and at 20 dB HL in the left ear. Word Recognition Testing was completed at 70 dB HL and [name] scored 100%, bilaterally.   Results:  Today's testing is consistent with normal hearing sensitivity in both ears with the exception of a mild hearing loss at 250 Hz and 3000 Hz in the left ear. [name] may have communication difficulty in adverse listening situations. She will benefit from the use of good communication strategies. The test results were reviewed with [name].  Recommendations: 1.   Monitor hearing sensitivity if future hearing concerns arise.     Marton Redwood Audiologist, Au.D.,  CCC-A 11/22/2020  2:30 PM  Cc: Lavada Mesi, MD

## 2020-11-28 DIAGNOSIS — G4733 Obstructive sleep apnea (adult) (pediatric): Secondary | ICD-10-CM | POA: Diagnosis not present

## 2020-12-13 ENCOUNTER — Telehealth: Payer: Self-pay | Admitting: Family Medicine

## 2020-12-13 ENCOUNTER — Other Ambulatory Visit: Payer: Self-pay

## 2020-12-13 ENCOUNTER — Ambulatory Visit
Admission: RE | Admit: 2020-12-13 | Discharge: 2020-12-13 | Disposition: A | Discharge status: Home / Self Care | Payer: Medicare HMO | Source: Ambulatory Visit | Attending: Family Medicine | Admitting: Family Medicine

## 2020-12-13 ENCOUNTER — Other Ambulatory Visit: Payer: Self-pay | Admitting: Family Medicine

## 2020-12-13 DIAGNOSIS — N6320 Unspecified lump in the left breast, unspecified quadrant: Secondary | ICD-10-CM

## 2020-12-13 DIAGNOSIS — N6489 Other specified disorders of breast: Secondary | ICD-10-CM | POA: Diagnosis not present

## 2020-12-13 DIAGNOSIS — Z9889 Other specified postprocedural states: Secondary | ICD-10-CM

## 2020-12-13 DIAGNOSIS — R922 Inconclusive mammogram: Secondary | ICD-10-CM | POA: Diagnosis not present

## 2020-12-13 MED ORDER — COVID-19 TESTING BY PHARMACIST KIT: 1.0000 "application " | PACK | Freq: Once | 1 refills | Status: AC

## 2020-12-13 NOTE — Telephone Encounter (Signed)
I called and advised the patient this was done.

## 2020-12-13 NOTE — Telephone Encounter (Signed)
Please advise 

## 2020-12-13 NOTE — Telephone Encounter (Signed)
Pt called stating she's at the [facility] pharmacy on [address] and she needs a rx to get a covid test; she would like to have that sent over as soon as possible and a CB to let her know when it's been done.   [phone]

## 2020-12-14 ENCOUNTER — Telehealth: Payer: Self-pay | Admitting: *Deleted

## 2020-12-14 NOTE — Telephone Encounter (Signed)
Received vm call from pt asking to set up virtual appt with Dr Jana Hakim.  Returned call & she states that she had a mammogram at the [facility] something was seen in her breast.  She is scheduled for a biopsy on Tues but would like to discuss with Dr Jana Hakim.  She has not seen him since 2020 & was dismissed from practice but she would feel more comfortable talking with him  Her ph # is [phone].  Message to Dr Jana Hakim

## 2020-12-16 ENCOUNTER — Telehealth: Payer: Self-pay | Admitting: Oncology

## 2020-12-16 NOTE — Telephone Encounter (Signed)
Scheduled follow-up appointment per 1/27 schedule message. Patient is aware. 

## 2020-12-20 ENCOUNTER — Ambulatory Visit
Admission: RE | Admit: 2020-12-20 | Discharge: 2020-12-20 | Disposition: A | Discharge status: Home / Self Care | Payer: Medicare HMO | Source: Ambulatory Visit | Attending: Family Medicine | Admitting: Family Medicine

## 2020-12-20 ENCOUNTER — Other Ambulatory Visit: Payer: Self-pay

## 2020-12-20 DIAGNOSIS — N6320 Unspecified lump in the left breast, unspecified quadrant: Secondary | ICD-10-CM

## 2020-12-23 DIAGNOSIS — G4733 Obstructive sleep apnea (adult) (pediatric): Secondary | ICD-10-CM | POA: Diagnosis not present

## 2021-01-02 ENCOUNTER — Ambulatory Visit
Admission: RE | Admit: 2021-01-02 | Discharge: 2021-01-02 | Disposition: A | Discharge status: Home / Self Care | Payer: Medicare HMO | Source: Ambulatory Visit | Attending: Family Medicine | Admitting: Family Medicine

## 2021-01-02 ENCOUNTER — Other Ambulatory Visit: Payer: Self-pay | Admitting: Diagnostic Radiology

## 2021-01-02 ENCOUNTER — Other Ambulatory Visit: Payer: Self-pay

## 2021-01-02 DIAGNOSIS — N641 Fat necrosis of breast: Secondary | ICD-10-CM | POA: Diagnosis not present

## 2021-01-02 DIAGNOSIS — R928 Other abnormal and inconclusive findings on diagnostic imaging of breast: Secondary | ICD-10-CM | POA: Diagnosis not present

## 2021-01-02 HISTORY — PX: BREAST BIOPSY: SHX20

## 2021-01-08 NOTE — Progress Notes (Signed)
[place]  [phone] [phone]    ID: Collene Mares DOB: [birth year]  MR#: [id number]  YSA#:[id number]  Patient Care Team: Eunice Blase, MD as PCP - General (Family Medicine) Stark Klein, MD as Consulting Physician (General Surgery) Myrick Mcnairy, Virgie Dad, MD as Consulting Physician (Oncology) Arloa Koh, MD (Inactive) as Consulting Physician (Radiation Oncology) Vania Rea, MD as Consulting Physician (Obstetrics and Gynecology) Newman Pies, MD as Consulting Physician (Neurosurgery) Angelina Ok, MD as Referring Physician (Surgery) Sylvan Cheese, NP as Nurse Practitioner (Hematology and Oncology) Gypsy Decant, [place] as Physician Assistant (Chiropractic Medicine) Lavonna Monarch, MD as Consulting Physician (Dermatology) OTHER MD:  I connected with Rhea Esaw Grandchild on 01/09/21 at 11:15 AM EST by video enabled telemedicine visit and verified that I am speaking with the correct person using two identifiers.   I discussed the limitations, risks, security and privacy concerns of performing an evaluation and management service by telemedicine and the availability of in-person appointments. I also discussed with the patient that there may be a patient responsible charge related to this service. The patient expressed understanding and agreed to proceed.   Other persons participating in the visit and their role in the encounter: None  Patient's location: Home Provider's location: [place]  Total time spent: 15 min   CHIEF COMPLAINT: Triple negative breast cancer   CURRENT TREATMENT: Observation   INTERVAL HISTORY:  Tifini was contacted today for follow-up of her triple negative breast cancer accompanied by her husband. She was released from follow up in 10/2019.  She presented for routine mammography with palpable abnormality in left infraclavicular region. She underwent bilateral diagnostic mammography  with tomography and left breast ultrasonography at [facility] on 12/13/2020 showing: breast density category B; indeterminate developing asymmetry within left breast without sonographic correlate; no concerning abnormality identified on ultrasound at site of palpable concern.  She proceeded to biopsy of the left breast area in question on 01/02/2021. Pathology from the procedure ([id number]) showed fat necrosis.   REVIEW OF SYSTEMS: Admire is generally doing well.  She tolerated her biopsy without any complications.  She worries about the fact that she has a metal clip and what might that mean long-term.  She also thinks she can feel the area that was biopsied and that anyway is reassuring to her because when she feels that she tells herself it already has been biopsied.  Aside from these issues a detailed review of systems today was stable   COVID 19 VACCINATION STATUS: Moderna x2, most recently 02/2020   BREAST CANCER HISTORY: From the original intake note:  Lenah herself noted a mass in her left axilla sometime in late November or she brought it to Dr. Nolon Rod attention and on 11/15/2014 she had a left mammogram and left ultrasound at the [facility] Center. The breast density was category 8. The left breast was entirely unremarkable, with no masses, distortion, or calcifications. In the left axilla there was a mass which was palpable and moderately firm. By ultrasound it measured 3.2 cm. There were no other axillary masses of concern.  Biopsy of the left axillary mass the same day showed ([id number]) a high-grade carcinoma which was estrogen and progesterone receptor negative, HER-2 negative, with a signals ratio of 1.53 and the number per cell of 2.60, with an MIB-1 of 95%. It was also gross cystic disease fluid protein negative, and negative for cytokeratin 20 and TTF-1. It was positive for cytokeratin 7. This was felt to be  consistent with a breast primary, but gynecologic and other malignancies  were also in the differential.  In general 04/08/2015 the patient had a diagnostic right mammogram, which was negative. On the same day she had bilateral breast MRI. There were no masses or abnormal enhancement in either breast. In the left axilla there was a round enhancing mass measuring 4.6 cm. There were mildly prominent smaller immediately adjacent lymph nodes. He was no right axillary or internal mammary adenopathy. An incidental lipoma was noted in the left lateral chest wall.  Her subsequent history is as detailed below.   PAST MEDICAL HISTORY: Past Medical History:  Diagnosis Date  . Anxiety   . Arthritis   . Breast cancer ([place])   . Family history of breast cancer   . Headache(784.0)   . Hypothyroidism   . Joint pain   . Personal history of chemotherapy   . Personal history of radiation therapy   . S/P radiation therapy 05/17/2015 through 06/24/2015    Left breast 5040 cGy in 28 sessions; left supraclavicular/axillary region 5040 cGy in 28 sessions     PAST SURGICAL HISTORY: Past Surgical History:  Procedure Laterality Date  . BREAST LUMPECTOMY Left 2015  . BREAST REDUCTION SURGERY    . CERVICAL CONE BIOPSY    . COLONOSCOPY  11/06/2011   Procedure: COLONOSCOPY;  Surgeon: Dorothyann Peng, MD;  [facility]: [facility];  Service: Endoscopy;  Laterality: N/A;  11:30 AM  . cyst removed from neck    . REDUCTION MAMMAPLASTY Bilateral 2001  . STERIOD INJECTION      FAMILY HISTORY Family History  Problem Relation Age of Onset  . Breast cancer Mother 68  . Heart attack Father   . Heart attack Maternal Uncle   . Cancer Maternal Uncle        NOS  . Prostate cancer Maternal Uncle   . Breast cancer Cousin        dx in her 79s  . Colon cancer Neg Hx   [name] died from a myocardial infarction at the age of 79. [name] died from breast cancer at the age of 44. It was diagnosed at  age 45. The patient has 2 brothers, one sister. There is no other history of breast or ovarian cancer in the family to her knowledge.   GYNECOLOGIC HISTORY:  Patient's last menstrual period was 08/09/2013. Menarche age 57, first live birth age 69. The patient is GX P2. She used oral contraceptives for a few months remotely with no side effects. She stopped having periods in September 2014. She did not take hormone replacement   SOCIAL HISTORY:  Shantika worked as [name] for [employer] but she has not worked since her diagnosis of cancer. As of her 2018 visit here she has been approved for disability. [name] worked as a Licensed conveyancer.  He is currently retired.  At home it's just the 2 of them, with no pets. Laurali's children from an earlier marriage are [name]. [name], who lives in [place] and is a Public relations account executive; and [name], who lives in [place] and is a Biochemist, clinical. The patient has 4 grandchildren +2 by adoption. She attends a [employer] locally    ADVANCED DIRECTIVES: Not in place   HEALTH MAINTENANCE: Social History   Tobacco Use  . Smoking status: Never Smoker  . Smokeless tobacco: Never Used  Vaping Use  . Vaping Use: Never used  Substance Use Topics  . Alcohol use: No  . Drug use: No     Colonoscopy: 2013/ [facility]  PAP: February 2015  Bone density: Never  Lipid panel:  Allergies  Allergen Reactions  . Other Rash    Unknown Flu Vaccine from 2015 caused rash    Current Outpatient Medications  Medication Sig Dispense Refill  . levothyroxine (SYNTHROID) 75 MCG tablet TAKE 1 TABLET (75 MCG TOTAL) BY MOUTH DAILY BEFORE BREAKFAST. 90 tablet 1  . ALPRAZolam (XANAX) 0.5 MG tablet Take 1 tablet (0.5 mg total) by mouth at bedtime as needed for anxiety. 30 tablet 1  . ascorbic acid (VITAMIN C) 500 MG tablet Take by mouth.    Marland Kitchen b complex vitamins capsule Take 1 capsule by mouth daily. (Patient not taking:  Reported on 11/09/2020)    . Cholecalciferol (VITAMIN D3) 125 MCG (5000 UT) CAPS Take by mouth daily.    . cyclobenzaprine (FLEXERIL) 10 MG tablet Take 1 tablet (10 mg total) by mouth 3 (three) times daily as needed for muscle spasms. 90 tablet 3  . famotidine (PEPCID) 20 MG tablet TAKE 1 TABLET BY MOUTH TWICE A DAY 180 tablet 1  . fluticasone (FLONASE) 50 MCG/ACT nasal spray fluticasone propionate 50 mcg/actuation nasal spray,suspension  USE 2 SPRAYS DAILY AS NEEDED    . gabapentin (NEURONTIN) 100 MG capsule Take 1 capsule (100 mg total) by mouth 3 (three) times daily as needed. (Patient not taking: Reported on 11/09/2020) 90 capsule 3  . Melatonin 3 MG TABS Take 3 mg by mouth at bedtime as needed (Sleep).    . meloxicam (MOBIC) 7.5 MG tablet Take 1 tablet (7.5 mg total) by mouth 2 (two) times daily as needed for pain. 60 tablet 3  . Selenium 100 MCG CAPS Take 1 capsule by mouth daily.    . vitamin k 100 MCG tablet Take 100 mcg by mouth daily.     No current facility-administered medications for this visit.    OBJECTIVE: African-American woman who appears younger than stated age There were no vitals filed for this visit.   There is no height or weight on file to calculate BMI.    ECOG FS:1 - Symptomatic but completely ambulatory   Telemedicine visit 01/09/2021  LAB RESULTS:  CMP     Component Value Date/Time   NA 143 05/11/2020 0900   NA 142 10/09/2017 0922   K 4.3 05/11/2020 0900   K 4.2 10/09/2017 0922   CL 106 05/11/2020 0900   CO2 28 05/11/2020 0900   CO2 27 10/09/2017 0922   GLUCOSE 94 05/11/2020 0900   GLUCOSE 90 10/09/2017 0922   BUN 11 05/11/2020 0900   BUN 13.5 10/09/2017 0922   CREATININE 0.83 05/11/2020 0900   CREATININE 0.9 10/09/2017 0922   CALCIUM 9.9 05/11/2020 0900   CALCIUM 9.5 10/09/2017 0922   PROT 7.2 05/11/2020 0900   PROT 7.1 10/09/2017 0922   ALBUMIN 4.3 10/29/2019 1230   ALBUMIN 4.0 10/09/2017 0922   AST 14 05/11/2020 0900   AST 16 10/29/2019 1230    AST 16 10/09/2017 0922   ALT 12 05/11/2020 0900   ALT 17 10/29/2019 1230   ALT 15 10/09/2017 0922   ALKPHOS 57 10/29/2019 1230   ALKPHOS 53 10/09/2017 0922   BILITOT 0.5 05/11/2020 0900   BILITOT 0.4 10/29/2019 1230   BILITOT 0.54 10/09/2017 0922   GFRNONAA >60 10/29/2019 1230   GFRAA >60 10/29/2019 1230    INo results found for: SPEP, UPEP  Lab Results  Component Value Date   WBC 3.8 05/11/2020   NEUTROABS 1,824 05/11/2020   HGB 13.4 05/11/2020   HCT 41.6 05/11/2020   MCV 86.5 05/11/2020   PLT 197 05/11/2020      Chemistry      Component Value Date/Time   NA 143 05/11/2020 0900   NA 142 10/09/2017 0922   K 4.3 05/11/2020 0900   K 4.2 10/09/2017 0922   CL 106 05/11/2020 0900   CO2 28 05/11/2020 0900   CO2 27 10/09/2017 0922   BUN 11 05/11/2020 0900   BUN 13.5 10/09/2017 0922   CREATININE 0.83 05/11/2020 0900   CREATININE 0.9 10/09/2017 0922   GLU 98 04/21/2009 0000      Component Value Date/Time   CALCIUM 9.9 05/11/2020 0900   CALCIUM 9.5 10/09/2017 0922   ALKPHOS 57 10/29/2019 1230   ALKPHOS 53 10/09/2017 0922   AST 14 05/11/2020 0900   AST 16 10/29/2019 1230   AST 16 10/09/2017 0922   ALT 12 05/11/2020 0900   ALT 17 10/29/2019 1230   ALT 15 10/09/2017 0922   BILITOT 0.5 05/11/2020 0900   BILITOT 0.4 10/29/2019 1230   BILITOT 0.54 10/09/2017 0922       No results found for: LABCA2  No components found for: EOFHQ197  No results for input(s): INR in the last 168 hours.  Urinalysis    Component Value Date/Time   COLORURINE YELLOW 11/26/2019 1642   APPEARANCEUR CLEAR 11/26/2019 1642   LABSPEC 1.013 11/26/2019 1642   PHURINE < OR = 5.0 11/26/2019 1642   GLUCOSEU NEGATIVE 11/26/2019 1642   HGBUR NEGATIVE 11/26/2019 1642   BILIRUBINUR negative 11/22/2019 1043   KETONESUR NEGATIVE 11/26/2019 1642   PROTEINUR NEGATIVE 11/26/2019 1642   UROBILINOGEN 0.2 11/22/2019 1043   UROBILINOGEN 0.2 01/13/2014 1924   NITRITE NEGATIVE 11/26/2019 1642    LEUKOCYTESUR NEGATIVE 11/26/2019 1642    STUDIES: US BREAST LTD UNI LEFT INC AXILLA  Addendum Date: 12/20/2020   ADDENDUM REPORT: 12/20/2020 09:17 ADDENDUM: The patient returned for stereotactic biopsy of the left breast on 12/20/2020. The usual consent and time-out procedure was performed. Local anesthesia was applied and a skin incision made. However, the vacuum assisted needle device malfunction at the time of biopsy and could not be recovered in a matter which allowed [place] to safely proceed. Therefore, the biopsy had to be canceled and the patient rescheduled for a different day. No tissue samples were obtained. Electronically Signed   By: Kristopher Oppenheim M.D.   On: 12/20/2020 09:17   Result Date: 12/20/2020 CLINICAL DATA:  Patient presents for palpable abnormality within the left infraclavicular region. Additionally, a history of left breast lumpectomy 2016. EXAM: DIGITAL DIAGNOSTIC BILATERAL MAMMOGRAM WITH CAD ULTRASOUND LEFT BREAST TECHNIQUE: Bilateral digital diagnostic mammography was performed. Digital images of the breasts were evaluated with computer-aided detection. Targeted ultrasound examination of the Left breast was performed COMPARISON:  Previous exam(s). ACR Breast Density Category b: There are scattered areas of fibroglandular density. FINDINGS: There is a developing asymmetry within the posterior left breast demonstrated best on the cc, spot cc and rolled cc views. This is not as well demonstrated on the true lateral and MLO views. No suspicious abnormality underlying the site of palpable concern within the left infraclavicular region aligned spot compression views Targeted ultrasound is performed, showing normal tissue without suspicious mass within the left breast 11-1 o'clock position. At the site of palpable concern within left infraclavicular location, normal tissue is visualized. No  left axillary adenopathy. IMPRESSION: Indeterminate developing asymmetry within the left breast without  sonographic correlate. No concerning abnormality identified on ultrasound at the site of palpable concern left infraclavicular region. RECOMMENDATION: Stereotactic guided core needle biopsy developing asymmetry left breast. I have discussed the findings and recommendations with the patient. If applicable, a reminder letter will be sent to the patient regarding the next appointment. BI-RADS CATEGORY  4: Suspicious. Electronically Signed: By: Lovey Newcomer M.D. On: 12/13/2020 12:10   MM DIAG BREAST TOMO BILATERAL  Addendum Date: 12/20/2020   ADDENDUM REPORT: 12/20/2020 09:17 ADDENDUM: The patient returned for stereotactic biopsy of the left breast on 12/20/2020. The usual consent and time-out procedure was performed. Local anesthesia was applied and a skin incision made. However, the vacuum assisted needle device malfunction at the time of biopsy and could not be recovered in a matter which allowed [place] to safely proceed. Therefore, the biopsy had to be canceled and the patient rescheduled for a different day. No tissue samples were obtained. Electronically Signed   By: Kristopher Oppenheim M.D.   On: 12/20/2020 09:17   Result Date: 12/20/2020 CLINICAL DATA:  Patient presents for palpable abnormality within the left infraclavicular region. Additionally, a history of left breast lumpectomy 2016. EXAM: DIGITAL DIAGNOSTIC BILATERAL MAMMOGRAM WITH CAD ULTRASOUND LEFT BREAST TECHNIQUE: Bilateral digital diagnostic mammography was performed. Digital images of the breasts were evaluated with computer-aided detection. Targeted ultrasound examination of the Left breast was performed COMPARISON:  Previous exam(s). ACR Breast Density Category b: There are scattered areas of fibroglandular density. FINDINGS: There is a developing asymmetry within the posterior left breast demonstrated best on the cc, spot cc and rolled cc views. This is not as well demonstrated on the true lateral and MLO views. No suspicious abnormality underlying the  site of palpable concern within the left infraclavicular region aligned spot compression views Targeted ultrasound is performed, showing normal tissue without suspicious mass within the left breast 11-1 o'clock position. At the site of palpable concern within left infraclavicular location, normal tissue is visualized. No left axillary adenopathy. IMPRESSION: Indeterminate developing asymmetry within the left breast without sonographic correlate. No concerning abnormality identified on ultrasound at the site of palpable concern left infraclavicular region. RECOMMENDATION: Stereotactic guided core needle biopsy developing asymmetry left breast. I have discussed the findings and recommendations with the patient. If applicable, a reminder letter will be sent to the patient regarding the next appointment. BI-RADS CATEGORY  4: Suspicious. Electronically Signed: By: Lovey Newcomer M.D. On: 12/13/2020 12:10   MM CLIP PLACEMENT LEFT  Result Date: 01/02/2021 CLINICAL DATA:  60 year old female status post stereotactic biopsy of the left breast. EXAM: DIAGNOSTIC LEFT MAMMOGRAM POST STEREOTACTIC BIOPSY COMPARISON:  Previous exam(s). FINDINGS: Mammographic images were obtained following stereotactic guided biopsy of the left breast. The biopsy clip is located in the expected position on the cc view. The clip is located approximately 1-1/2 to 2 cm superior to the biopsy defect is seen on the ML projection, raising the question of possible migration. IMPRESSION: Possible 1.5-2 cm superior migration of the biopsy clip relative to the defect is seen on the ML projection. The clip appears in the appropriate position on the CC projection. Final Assessment: Post Procedure Mammograms for Marker Placement Electronically Signed   By: Kristopher Oppenheim M.D.   On: 01/02/2021 09:34   MM LT BREAST BX W LOC DEV 1ST LESION IMAGE BX SPEC STEREO GUIDE  Addendum Date: 01/03/2021   ADDENDUM REPORT: 01/03/2021 14:38 ADDENDUM: Pathology revealed FAT  NECROSIS of the LEFT breast, upper central. NO EVIDENCE OF MALIGNANCY. This was found to be concordant by Dr. Kristopher Oppenheim. Pathology results were discussed with the patient by telephone. The patient reported doing well after the biopsy with tenderness at the site. Post biopsy instructions and care were reviewed and questions were answered. The patient was encouraged to call [facility] for any additional concerns. The patient was instructed to return for left diagnostic mammography and possible ultrasound in 6 months and informed a reminder notice would be sent regarding this appointment. Pathology results reported by Stacie Acres RN on 01/03/2021. Electronically Signed   By: Kristopher Oppenheim M.D.   On: 01/03/2021 14:38   Result Date: 01/03/2021 CLINICAL DATA:  60 year old female with an indeterminate left breast asymmetry. EXAM: LEFT BREAST STEREOTACTIC CORE NEEDLE BIOPSY COMPARISON:  Previous exams. FINDINGS: The patient and I discussed the procedure of stereotactic-guided biopsy including benefits and alternatives. We discussed the high likelihood of a successful procedure. We discussed the risks of the procedure including infection, bleeding, tissue injury, clip migration, and inadequate sampling. Informed written consent was given. The usual time out protocol was performed immediately prior to the procedure. Using sterile technique and 1% Lidocaine as local anesthetic, under stereotactic guidance, a 9 gauge vacuum assisted device was used to perform core needle biopsy of an asymmetry in the upper left breast using a superior approach. Lesion quadrant: Upper outer quadrant At the conclusion of the procedure, a coil shaped tissue marker clip was deployed into the biopsy cavity. Follow-up 2-view mammogram was performed and dictated separately. IMPRESSION: Stereotactic-guided biopsy of the left breast. No apparent complications. Electronically Signed: By: Kristopher Oppenheim M.D. On:  01/02/2021 09:32     ASSESSMENT: 60 y.o. [place] woman status post excisional biopsy 11/15/2014 of a 4.6 cm left axillary tail mass showing a poorly differentiated carcinoma, triple negative, with an MIB-1 of 90%.  (1) s/p left axillary lymph node dissection under Dr.Howard-McNatt 12/23/2014 with 1 of 14 lymph nodes positive; TX N1, stage II; repeat prognostic panel again triple negative  (2) adjuvant chemotherapy started 01/17/2015, consisting of cyclophosphamide and doxorubicin 4 given in dose dense fashion, completed 02/28/2015, followed by paclitaxel and carboplatin given weekly with 12 cycles planned, but stopped after only 3 cycles because of progressive neuropathy, last dose 03/28/2015  (3) radiation 05/17/2015 through 06/24/2015:Left breast 5040 cGy in 28 sessions; left supraclavicular/axillary region 5040 cGy in 28 sessions  (4) genetics testing November 29 2015 through the Breast/Ovarian gene panel offered by [facility] found no deleterious mutations in ATM, BARD1, BRCA1, BRCA2, BRIP1, CDH1, CHEK2, EPCAM, FANCC, MLH1, MSH2, MSH6, NBN, PALB2, PMS2, PTEN, RAD51C, RAD51D, TP53, and XRCC2   PLAN: Nashya is now 6 years out from definitive surgery for her breast cancer with no evidence of disease recurrence.  This is very favorable.  It is always frightening to get a call back from mammography but of course that is why we do the mammograms, namely to look for things that may develop before the patient has any symptoms.  I reassured her that fat necrosis is common and is entirely benign.  Also the little clip in her breast will be useful for future reference.  They do not want to rebiopsy that area next time they see it and having the clip there will remind them that that area was already biopsied  At this point I am comfortable releasing her from further follow-up here.  Of course I will be glad to see  her again at any point in the future if and when the need  arises     Jaymee Tilson, Virgie Dad, MD  01/09/21 2:07 PM Medical Oncology and Hematology [facility] [place], [place] [address] Tel. [phone]    Fax. [phone]   I, Wilburn Mylar, am acting as scribe for Dr. Virgie Dad. Jull Harral.  I, Lurline Del MD, have reviewed the above documentation for accuracy and completeness, and I agree with the above.   *Total Encounter Time as defined by the Centers for Medicare and Medicaid Services includes, in addition to the face-to-face time of a patient visit (documented in the note above) non-face-to-face time: obtaining and reviewing outside history, ordering and reviewing medications, tests or procedures, care coordination (communications with other health care professionals or caregivers) and documentation in the medical record.

## 2021-01-09 ENCOUNTER — Inpatient Hospital Stay: Payer: Medicare HMO | Attending: Oncology | Admitting: Oncology

## 2021-01-09 DIAGNOSIS — Z853 Personal history of malignant neoplasm of breast: Secondary | ICD-10-CM | POA: Insufficient documentation

## 2021-01-09 DIAGNOSIS — Z171 Estrogen receptor negative status [ER-]: Secondary | ICD-10-CM | POA: Diagnosis not present

## 2021-01-09 DIAGNOSIS — C50612 Malignant neoplasm of axillary tail of left female breast: Secondary | ICD-10-CM | POA: Diagnosis not present

## 2021-01-11 ENCOUNTER — Telehealth: Payer: Self-pay | Admitting: Oncology

## 2021-01-11 NOTE — Telephone Encounter (Signed)
No 2/21 los. No changes made to pt's schedule.

## 2021-01-13 ENCOUNTER — Other Ambulatory Visit: Payer: Self-pay | Admitting: Oncology

## 2021-01-20 DIAGNOSIS — G4733 Obstructive sleep apnea (adult) (pediatric): Secondary | ICD-10-CM | POA: Diagnosis not present

## 2021-02-20 ENCOUNTER — Telehealth: Payer: Self-pay | Admitting: Family Medicine

## 2021-02-20 MED ORDER — MONTELUKAST SODIUM 10 MG PO TABS: 10.0000 mg | ORAL_TABLET | Freq: Every day | ORAL | 11 refills | Status: DC

## 2021-02-20 NOTE — Telephone Encounter (Signed)
The patient c/o runny and puffy eyes - the eyes are "constantly running." She has been using a neti pot and taking Benedryl at night. In the past, Allegra would help her allergies --- "the over-the-counter stuff is not working this time." She asks if there is something that could be prescribed.

## 2021-02-20 NOTE — Telephone Encounter (Signed)
Singulair Rx sent.  Can also try quercetin 500 mg twice daily (OTC).

## 2021-02-20 NOTE — Telephone Encounter (Signed)
I called and advised the patient. She said she will try these and let us know if she fails to improve, or especially if symptoms worsen.

## 2021-02-20 NOTE — Telephone Encounter (Signed)
Patient called. She would like to know if Dr. Junius Roads could prescribe her something for her allergies. Her call back number is [phone]

## 2021-02-24 ENCOUNTER — Encounter: Payer: Self-pay | Admitting: Family Medicine

## 2021-03-01 DIAGNOSIS — G4733 Obstructive sleep apnea (adult) (pediatric): Secondary | ICD-10-CM | POA: Diagnosis not present

## 2021-03-09 ENCOUNTER — Telehealth: Payer: Self-pay

## 2021-03-09 ENCOUNTER — Encounter: Payer: Self-pay | Admitting: Family Medicine

## 2021-03-09 MED ORDER — PREDNISONE 10 MG PO TABS: ORAL_TABLET | ORAL | 0 refills | Status: DC

## 2021-03-09 NOTE — Telephone Encounter (Signed)
Dr. Junius Roads and the patient corresponded through [place] this morning regarding this.

## 2021-03-09 NOTE — Telephone Encounter (Signed)
Patient called she is requesting pain medication for her right hand she stated the pain is starting to make her arm hurt as well she stated her hand is in pain to the point where she cant touch it and her hand is swollen she is requesting a call back when rx has been sent to the pharmacy call back:[phone]

## 2021-03-09 NOTE — Addendum Note (Signed)
Addended by: Hortencia Pilar on: 03/09/2021 10:28 AM   Modules accepted: Orders

## 2021-03-13 ENCOUNTER — Other Ambulatory Visit: Payer: Self-pay | Admitting: Family Medicine

## 2021-04-12 DIAGNOSIS — H5203 Hypermetropia, bilateral: Secondary | ICD-10-CM | POA: Diagnosis not present

## 2021-04-12 DIAGNOSIS — H5200 Hypermetropia, unspecified eye: Secondary | ICD-10-CM | POA: Diagnosis not present

## 2021-04-12 DIAGNOSIS — Z0100 Encounter for examination of eyes and vision without abnormal findings: Secondary | ICD-10-CM | POA: Diagnosis not present

## 2021-05-01 ENCOUNTER — Encounter: Payer: Self-pay | Admitting: Family Medicine

## 2021-05-01 ENCOUNTER — Ambulatory Visit (INDEPENDENT_AMBULATORY_CARE_PROVIDER_SITE_OTHER): Payer: Medicare HMO | Admitting: Family Medicine

## 2021-05-01 ENCOUNTER — Other Ambulatory Visit: Payer: Self-pay

## 2021-05-01 VITALS — BP 98/62 | HR 72 | Ht 67.5 in | Wt 171.4 lb

## 2021-05-01 DIAGNOSIS — M2550 Pain in unspecified joint: Secondary | ICD-10-CM

## 2021-05-01 DIAGNOSIS — E039 Hypothyroidism, unspecified: Secondary | ICD-10-CM | POA: Diagnosis not present

## 2021-05-01 DIAGNOSIS — E559 Vitamin D deficiency, unspecified: Secondary | ICD-10-CM

## 2021-05-01 DIAGNOSIS — Z8669 Personal history of other diseases of the nervous system and sense organs: Secondary | ICD-10-CM | POA: Diagnosis not present

## 2021-05-01 DIAGNOSIS — Z0000 Encounter for general adult medical examination without abnormal findings: Secondary | ICD-10-CM

## 2021-05-01 DIAGNOSIS — F418 Other specified anxiety disorders: Secondary | ICD-10-CM | POA: Diagnosis not present

## 2021-05-01 DIAGNOSIS — R768 Other specified abnormal immunological findings in serum: Secondary | ICD-10-CM

## 2021-05-01 DIAGNOSIS — R200 Anesthesia of skin: Secondary | ICD-10-CM | POA: Diagnosis not present

## 2021-05-01 MED ORDER — ALPRAZOLAM 0.5 MG PO TABS: 0.5000 mg | ORAL_TABLET | Freq: Every evening | ORAL | 1 refills | Status: AC | PRN

## 2021-05-01 NOTE — Progress Notes (Signed)
Office Visit Note   Patient: Robin Franklin           Date of Birth: Apr 11, 1961           MRN: [id number] Visit Date: 05/01/2021 Requested by: Eunice Blase, MD [place],  [place] [address] PCP: Eunice Blase, MD  Subjective: Chief Complaint  Patient presents with   Other    Follow up on thyroid and vitamin D deficiency. Would like some blood work.    HPI: She is here for routine monitoring of hypothyroidism.  She would also like other labs drawn for annual wellness visit which will be sometime in the near future.  She is clinically euthyroid on 75 mcg of levothyroxine.  She is having occasional migraine headaches but they are generally controlled with over-the-counter NSAIDs.                ROS:   All other systems were reviewed and are negative.  Objective: Vital Signs: BP 98/62 (BP Location: Right Arm, Patient Position: Sitting, Cuff Size: Normal)   Pulse 72   Ht 5' 7.5" (1.715 m)   Wt 171 lb 6.4 oz (77.7 kg)   LMP 08/09/2013   BMI 26.45 kg/m   Physical Exam:  General:  Alert and oriented, in no acute distress. Pulm:  Breathing unlabored. Psy:  Normal mood, congruent affect.   HEENT:  Belt/AT, PERRLA, EOM Full, no nystagmus.  Funduscopic examination within normal limits.  No conjunctival erythema.  Tympanic membranes are pearly gray with normal landmarks.  External ear canals are normal.  Nasal passages are clear.  Oropharynx is clear.  No significant lymphadenopathy.  No thyromegaly or nodules.  2+ carotid pulses without bruits. CV: Regular rate and rhythm without murmurs, rubs, or gallops.  No peripheral edema.  2+ radial and posterior tibial pulses. Lungs: Clear to auscultation throughout with no wheezing or areas of consolidation.    Imaging: No results found.  Assessment & Plan:  Hypothyroid - Labs today, refill when needed.  2.  Wellness exam - Labs today.  3.  Migraines - Magnesium level.  4.  Multiple joint pain, positive ANA  5.   Situational anxiety - Refilled xanax.    Procedures: No procedures performed        PMFS History: Patient Active Problem List   Diagnosis Date Noted   Vitamin D deficiency 11/09/2020   Irregular periods 07/04/2020   Menopausal symptom 07/04/2020   Uterine leiomyoma 07/04/2020   Positive ANA (antinuclear antibody) 05/11/2020   Situational anxiety 05/11/2020   Educated about COVID-19 virus infection 12/29/2019   Generalized abdominal pain 11/30/2019   Abnormal finding on urinalysis 11/30/2019   Pain of left hand 10/30/2019   Sleep apnea in adult 10/29/2019   Gastroesophageal reflux disease without esophagitis 08/04/2019   Hyperlipidemia 08/04/2019   Sleep disturbance 08/04/2019   Hypothyroidism 10/09/2017   History of malignant neoplasm of breast 06/14/2016   Genetic testing 11/29/2015   Family history of breast cancer    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 [place])    Family  history of breast cancer    Headache(784.0)    Hypothyroidism    Joint pain    Personal history of chemotherapy    Personal history of radiation therapy    S/P radiation therapy 05/17/2015 through 06/24/2015                                                      Left breast 5040 cGy in 28 sessions; left supraclavicular/axillary region 5040 cGy in 28 sessions                          Family History  Problem Relation Age of Onset   Breast cancer Mother 31   Heart attack Father    Heart attack Maternal Uncle    Cancer Maternal Uncle        NOS    Prostate cancer Maternal Uncle    Breast cancer Cousin        dx in her 87s   Colon cancer Neg Hx     Past Surgical History:  Procedure Laterality Date   BREAST LUMPECTOMY Left 2015   BREAST REDUCTION SURGERY     CERVICAL CONE BIOPSY     COLONOSCOPY  11/06/2011   Procedure: COLONOSCOPY;  Surgeon: Dorothyann Peng, MD;  [facility];  Service: Endoscopy;  Laterality: N/A;  11:30 AM   cyst removed from neck     REDUCTION MAMMAPLASTY Bilateral 2001   STERIOD INJECTION     Social History   Occupational History   Not on file  Tobacco Use   Smoking status: Never   Smokeless tobacco: Never  Vaping Use   Vaping Use: Never used  Substance and Sexual Activity   Alcohol use: No   Drug use: No   Sexual activity: Yes

## 2021-05-02 ENCOUNTER — Telehealth: Payer: Self-pay | Admitting: Family Medicine

## 2021-05-02 ENCOUNTER — Other Ambulatory Visit: Payer: Self-pay | Admitting: Family Medicine

## 2021-05-02 DIAGNOSIS — E039 Hypothyroidism, unspecified: Secondary | ICD-10-CM

## 2021-05-02 LAB — CBC WITH DIFFERENTIAL/PLATELET
Absolute Monocytes: 414 cells/uL (ref 200–950)
Basophils Absolute: 40 cells/uL (ref 0–200)
Basophils Relative: 0.9 %
Eosinophils Absolute: 62 cells/uL (ref 15–500)
Eosinophils Relative: 1.4 %
HCT: 39.8 % (ref 35.0–45.0)
Hemoglobin: 12.9 g/dL (ref 11.7–15.5)
Lymphs Abs: 1888 cells/uL (ref 850–3900)
MCH: 27.9 pg (ref 27.0–33.0)
MCHC: 32.4 g/dL (ref 32.0–36.0)
MCV: 86.1 fL (ref 80.0–100.0)
MPV: 11.3 fL (ref 7.5–12.5)
Monocytes Relative: 9.4 %
Neutro Abs: 1998 cells/uL (ref 1500–7800)
Neutrophils Relative %: 45.4 %
Platelets: 203 10*3/uL (ref 140–400)
RBC: 4.62 10*6/uL (ref 3.80–5.10)
RDW: 12.3 % (ref 11.0–15.0)
Total Lymphocyte: 42.9 %
WBC: 4.4 10*3/uL (ref 3.8–10.8)

## 2021-05-02 LAB — COMPREHENSIVE METABOLIC PANEL
AG Ratio: 1.7 (calc) (ref 1.0–2.5)
ALT: 9 U/L (ref 6–29)
AST: 13 U/L (ref 10–35)
Albumin: 4.7 g/dL (ref 3.6–5.1)
Alkaline phosphatase (APISO): 49 U/L (ref 37–153)
BUN/Creatinine Ratio: 18 (calc) (ref 6–22)
BUN: 19 mg/dL (ref 7–25)
CO2: 28 mmol/L (ref 20–32)
Calcium: 10.1 mg/dL (ref 8.6–10.4)
Chloride: 104 mmol/L (ref 98–110)
Creat: 1.04 mg/dL — ABNORMAL HIGH (ref 0.50–0.99)
Globulin: 2.7 g/dL (calc) (ref 1.9–3.7)
Glucose, Bld: 78 mg/dL (ref 65–99)
Potassium: 4.3 mmol/L (ref 3.5–5.3)
Sodium: 141 mmol/L (ref 135–146)
Total Bilirubin: 0.4 mg/dL (ref 0.2–1.2)
Total Protein: 7.4 g/dL (ref 6.1–8.1)

## 2021-05-02 LAB — SEDIMENTATION RATE: Sed Rate: 2 mm/h (ref 0–30)

## 2021-05-02 LAB — MAGNESIUM: Magnesium: 2.2 mg/dL (ref 1.5–2.5)

## 2021-05-02 LAB — LIPID PANEL
Cholesterol: 207 mg/dL — ABNORMAL HIGH (ref <200)
HDL: 63 mg/dL (ref >=50)
LDL Cholesterol (Calc): 120 mg/dL (calc) — ABNORMAL HIGH
Non-HDL Cholesterol (Calc): 144 mg/dL (calc) — ABNORMAL HIGH (ref <130)
Total CHOL/HDL Ratio: 3.3 (calc) (ref <5.0)
Triglycerides: 125 mg/dL (ref <150)

## 2021-05-02 LAB — HEMOGLOBIN A1C
Hgb A1c MFr Bld: 5.5 % of total Hgb (ref <5.7)
Mean Plasma Glucose: 111 mg/dL
eAG (mmol/L): 6.2 mmol/L

## 2021-05-02 LAB — C-REACTIVE PROTEIN: CRP: 0.4 mg/L (ref <8.0)

## 2021-05-02 LAB — D-DIMER, QUANTITATIVE: D-Dimer, Quant: 0.41 mcg/mL FEU (ref <0.50)

## 2021-05-02 LAB — VITAMIN D 25 HYDROXY (VIT D DEFICIENCY, FRACTURES): Vit D, 25-Hydroxy: 52 ng/mL (ref 30–100)

## 2021-05-02 LAB — VITAMIN B12: Vitamin B-12: 570 pg/mL (ref 200–1100)

## 2021-05-02 NOTE — Telephone Encounter (Signed)
Labs are notable for the following:  Lipids are slightly abnormal with total cholesterol of 207 and LDL of 120.  HDL looks good at 63 and triglycerides are good at 125.  It remains important to exercise regularly and to minimize dietary intake of processed carbohydrates and sweets.  Recheck in 1 year.  Creatinine/kidney function is slightly abnormal this time at 0.37.  Reason is uncertain, but I would suggest rechecking in 4 to 8 weeks to be sure it is back in normal range.  All else looks good.

## 2021-05-26 ENCOUNTER — Encounter: Payer: Self-pay | Admitting: Family Medicine

## 2021-06-06 ENCOUNTER — Ambulatory Visit (INDEPENDENT_AMBULATORY_CARE_PROVIDER_SITE_OTHER): Payer: Medicare HMO | Admitting: Family Medicine

## 2021-06-06 ENCOUNTER — Other Ambulatory Visit: Payer: Self-pay

## 2021-06-06 VITALS — BP 115/65 | HR 81 | Ht 67.75 in | Wt 168.2 lb

## 2021-06-06 DIAGNOSIS — R221 Localized swelling, mass and lump, neck: Secondary | ICD-10-CM

## 2021-06-06 DIAGNOSIS — M545 Low back pain, unspecified: Secondary | ICD-10-CM

## 2021-06-06 DIAGNOSIS — R8290 Unspecified abnormal findings in urine: Secondary | ICD-10-CM | POA: Diagnosis not present

## 2021-06-06 NOTE — Progress Notes (Signed)
Office Visit Note   Patient: Robin Franklin           Date of Birth: 1960/12/01           MRN: [id number] Visit Date: 06/06/2021 Requested by: Eunice Blase, MD [address] [place],  [place] [address] PCP: Eunice Blase, MD  Subjective: Chief Complaint  Patient presents with   Lower Back - Pain    Pain x 2 weeks. Has been walking regularly, around 10K steps/daily. Recalls no injury. Pain is mainly on the left side, but occasionally will switch to the right side. No pain radiating down the legs. She has had issues with intermittent back pain for years.   Other    4 week follow up of swelling right side of neck - no change per patient/blood work    HPI: She is here with a few concerns.  She has had swelling on the left side of her neck for a few weeks.  It does not hurt.  She has a history of lymphedema in her left arm after breast cancer surgery.  No fevers or chills.  Low back has been hurting for a couple weeks, no injury.  She is status post back surgery years ago.  She had physical therapy and chiropractic as well as acupuncture back then.  Pain is now in the left posterior hip with occasional radiation toward the posterior thigh.  Her urine has had a foul odor at night for the past couple weeks.  No daytime symptoms.                ROS:   All other systems were reviewed and are negative.  Objective: Vital Signs: BP 115/65 (BP Location: Right Arm, Patient Position: Sitting, Cuff Size: Normal)   Pulse 81   Ht 5' 7.75" (1.721 m)   Wt 168 lb 3.2 oz (76.3 kg)   LMP 08/09/2013   BMI 25.76 kg/m   Physical Exam:  General:  Alert and oriented, in no acute distress. Pulm:  Breathing unlabored. Psy:  Normal mood, congruent affect. Skin: No rash Neck: There is subtle soft tissue swelling on the left side of the neck.  No distinct nodules palpable.  Good range of motion. Low back: She is tender near the left SI joint.  No pain in the sciatic notch.  Lower extremity strength  and reflexes are normal.     Imaging: No results found.  Assessment & Plan: Left-sided neck swelling, etiology uncertain. -CBC with differential, sed rate and C-reactive protein ordered.  If all normal, then soft tissue MRI scan.  2.  Acute on chronic left-sided low back/posterior hip pain, possible SI joint dysfunction.  Cannot rule out recurrent disc protrusion. - X-rays and MRI scan ordered. -Could contemplate injection depending on the findings.  3.  Foul urine odor -Culture obtained.     Procedures: No procedures performed        PMFS History: Patient Active Problem List   Diagnosis Date Noted   Vitamin D deficiency 11/09/2020   Irregular periods 07/04/2020   Menopausal symptom 07/04/2020   Uterine leiomyoma 07/04/2020   Positive ANA (antinuclear antibody) 05/11/2020   Situational anxiety 05/11/2020   Educated about COVID-19 virus infection 12/29/2019   Generalized abdominal pain 11/30/2019   Abnormal finding on urinalysis 11/30/2019   Pain of left hand 10/30/2019   Sleep apnea in adult 10/29/2019   Gastroesophageal reflux disease without esophagitis 08/04/2019   Hyperlipidemia 08/04/2019   Sleep disturbance 08/04/2019   Hypothyroidism  10/09/2017   History of malignant neoplasm of breast 06/14/2016   Genetic testing 11/29/2015   Family history of breast cancer    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   Chemotherapy-induced nausea 03/21/2015   Back pain 03/21/2015   Constipation 03/21/2015   Watery eyes 02/21/2015   Mucositis due to chemotherapy 02/21/2015   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, estrogen receptor negative ([facility]) 11/28/2014   Lumbar radiculopathy 05/26/2012   Cervical intraepithelial neoplasia grade 1 11/19/2002   Past Medical History:  Diagnosis Date   Anxiety    Arthritis    Breast cancer ([place])    Family history of breast cancer    Headache(784.0)    Hypothyroidism    Joint pain    Personal  history of chemotherapy    Personal history of radiation therapy    S/P radiation therapy 05/17/2015 through 06/24/2015                                                      Left breast 5040 cGy in 28 sessions; left supraclavicular/axillary region 5040 cGy in 28 sessions                          Family History  Problem Relation Age of Onset   Breast cancer Mother 56   Heart attack Father    Heart attack Maternal Uncle    Cancer Maternal Uncle        NOS   Prostate cancer Maternal Uncle    Breast cancer Cousin        dx in her 67s   Colon cancer Neg Hx     Past Surgical History:  Procedure Laterality Date   BREAST LUMPECTOMY Left 2015   BREAST REDUCTION SURGERY     CERVICAL CONE BIOPSY     COLONOSCOPY  11/06/2011   Procedure: COLONOSCOPY;  Surgeon: Dorothyann Peng, MD;  Location[facility];  Service: Endoscopy;  Laterality: N/A;  11:30 AM   cyst removed from neck     REDUCTION MAMMAPLASTY Bilateral 2001   STERIOD INJECTION     Social History   Occupational History   Not on file  Tobacco Use   Smoking status: Never   Smokeless tobacco: Never  Vaping Use   Vaping Use: Never used  Substance and Sexual Activity   Alcohol use: No   Drug use: No   Sexual activity: Yes

## 2021-06-07 LAB — CBC WITH DIFFERENTIAL/PLATELET
Absolute Monocytes: 413 cells/uL (ref 200–950)
Basophils Absolute: 39 cells/uL (ref 0–200)
Basophils Relative: 0.9 %
Eosinophils Absolute: 52 cells/uL (ref 15–500)
Eosinophils Relative: 1.2 %
HCT: 42.7 % (ref 35.0–45.0)
Hemoglobin: 13.7 g/dL (ref 11.7–15.5)
Lymphs Abs: 1763 cells/uL (ref 850–3900)
MCH: 27.4 pg (ref 27.0–33.0)
MCHC: 32.1 g/dL (ref 32.0–36.0)
MCV: 85.4 fL (ref 80.0–100.0)
MPV: 11.4 fL (ref 7.5–12.5)
Monocytes Relative: 9.6 %
Neutro Abs: 2034 cells/uL (ref 1500–7800)
Neutrophils Relative %: 47.3 %
Platelets: 227 10*3/uL (ref 140–400)
RBC: 5 10*6/uL (ref 3.80–5.10)
RDW: 12.5 % (ref 11.0–15.0)
Total Lymphocyte: 41 %
WBC: 4.3 10*3/uL (ref 3.8–10.8)

## 2021-06-07 LAB — URINE CULTURE
MICRO NUMBER:: 12136444
Result:: NO GROWTH
SPECIMEN QUALITY:: ADEQUATE

## 2021-06-07 LAB — SEDIMENTATION RATE: Sed Rate: 2 mm/h (ref 0–30)

## 2021-06-07 LAB — C-REACTIVE PROTEIN: CRP: 0.8 mg/L (ref <8.0)

## 2021-06-08 ENCOUNTER — Telehealth: Payer: Self-pay | Admitting: Family Medicine

## 2021-06-08 DIAGNOSIS — R221 Localized swelling, mass and lump, neck: Secondary | ICD-10-CM

## 2021-06-08 NOTE — Telephone Encounter (Signed)
Labs all look normal.  MRI will be ordered.

## 2021-06-14 ENCOUNTER — Other Ambulatory Visit: Payer: Self-pay | Admitting: Obstetrics & Gynecology

## 2021-06-14 DIAGNOSIS — R928 Other abnormal and inconclusive findings on diagnostic imaging of breast: Secondary | ICD-10-CM

## 2021-06-15 DIAGNOSIS — Z8249 Family history of ischemic heart disease and other diseases of the circulatory system: Secondary | ICD-10-CM | POA: Diagnosis not present

## 2021-06-15 DIAGNOSIS — Z853 Personal history of malignant neoplasm of breast: Secondary | ICD-10-CM | POA: Diagnosis not present

## 2021-06-15 DIAGNOSIS — R030 Elevated blood-pressure reading, without diagnosis of hypertension: Secondary | ICD-10-CM | POA: Diagnosis not present

## 2021-06-15 DIAGNOSIS — F419 Anxiety disorder, unspecified: Secondary | ICD-10-CM | POA: Diagnosis not present

## 2021-06-15 DIAGNOSIS — G8929 Other chronic pain: Secondary | ICD-10-CM | POA: Diagnosis not present

## 2021-06-15 DIAGNOSIS — E039 Hypothyroidism, unspecified: Secondary | ICD-10-CM | POA: Diagnosis not present

## 2021-06-15 DIAGNOSIS — Z803 Family history of malignant neoplasm of breast: Secondary | ICD-10-CM | POA: Diagnosis not present

## 2021-06-19 ENCOUNTER — Ambulatory Visit
Admission: RE | Admit: 2021-06-19 | Discharge: 2021-06-19 | Disposition: A | Discharge status: Home / Self Care | Payer: Medicare HMO | Source: Ambulatory Visit | Attending: Family Medicine | Admitting: Family Medicine

## 2021-06-19 ENCOUNTER — Other Ambulatory Visit: Payer: Self-pay

## 2021-06-19 DIAGNOSIS — M545 Low back pain, unspecified: Secondary | ICD-10-CM

## 2021-06-19 DIAGNOSIS — M48061 Spinal stenosis, lumbar region without neurogenic claudication: Secondary | ICD-10-CM | POA: Diagnosis not present

## 2021-06-20 ENCOUNTER — Telehealth: Payer: Self-pay | Admitting: Family Medicine

## 2021-06-20 NOTE — Telephone Encounter (Signed)
Lumbar MRI scan is notable for several mild disc bulges, and mild arthritis at multiple levels, but no nerve impingement.  No indication for surgery.

## 2021-06-25 ENCOUNTER — Other Ambulatory Visit: Payer: Medicare HMO

## 2021-06-26 ENCOUNTER — Encounter: Payer: Self-pay | Admitting: Family Medicine

## 2021-07-04 ENCOUNTER — Encounter: Payer: Self-pay | Admitting: Family Medicine

## 2021-07-04 DIAGNOSIS — M545 Low back pain, unspecified: Secondary | ICD-10-CM

## 2021-07-14 ENCOUNTER — Encounter: Payer: Self-pay | Admitting: *Deleted

## 2021-07-19 ENCOUNTER — Ambulatory Visit (INDEPENDENT_AMBULATORY_CARE_PROVIDER_SITE_OTHER): Payer: Medicare HMO | Admitting: Physical Medicine and Rehabilitation

## 2021-07-19 ENCOUNTER — Encounter: Payer: Self-pay | Admitting: Physical Medicine and Rehabilitation

## 2021-07-19 ENCOUNTER — Ambulatory Visit: Payer: Self-pay

## 2021-07-19 ENCOUNTER — Other Ambulatory Visit: Payer: Self-pay

## 2021-07-19 VITALS — BP 128/85 | HR 75

## 2021-07-19 DIAGNOSIS — M5416 Radiculopathy, lumbar region: Secondary | ICD-10-CM

## 2021-07-19 MED ORDER — METHYLPREDNISOLONE ACETATE 80 MG/ML IJ SUSP
80.0000 mg | Freq: Once | INTRAMUSCULAR | Status: AC
  Administered 2021-07-19: 80 mg

## 2021-07-19 NOTE — Progress Notes (Signed)
Pt state lower back pain. Pt state bending and turn her body makes the pain worse. Pt state it feels like pressure when she bend. Pt state ice/ heat and takes over the counter pain meds to help ease her pain.  Numeric Pain Rating Scale and Functional Assessment Average Pain 5   In the last MONTH (on 0-10 scale) has pain interfered with the following?  1. General activity like being  able to carry out your everyday physical activities such as walking, climbing stairs, carrying groceries, or moving a chair?  Rating(10)   +Driver, -BT, -Dye Allergies.

## 2021-07-19 NOTE — Patient Instructions (Signed)

## 2021-07-25 ENCOUNTER — Other Ambulatory Visit: Payer: Self-pay

## 2021-07-25 ENCOUNTER — Ambulatory Visit
Admission: RE | Admit: 2021-07-25 | Discharge: 2021-07-25 | Disposition: A | Discharge status: Home / Self Care | Payer: Medicare HMO | Source: Ambulatory Visit | Attending: Family Medicine | Admitting: Family Medicine

## 2021-07-25 DIAGNOSIS — G8929 Other chronic pain: Secondary | ICD-10-CM | POA: Diagnosis not present

## 2021-07-25 DIAGNOSIS — M47812 Spondylosis without myelopathy or radiculopathy, cervical region: Secondary | ICD-10-CM | POA: Diagnosis not present

## 2021-07-25 DIAGNOSIS — R221 Localized swelling, mass and lump, neck: Secondary | ICD-10-CM | POA: Diagnosis not present

## 2021-07-25 DIAGNOSIS — M50221 Other cervical disc displacement at C4-C5 level: Secondary | ICD-10-CM | POA: Diagnosis not present

## 2021-08-05 NOTE — Progress Notes (Signed)
Robin Franklin - 60 y.o. female MRN [id number]  Date of birth: 1961-04-06  Office Visit Note: Visit Date: 07/19/2021 PCP: Eunice Blase, MD Referred by: Eunice Blase, MD  Subjective: Chief Complaint  Patient presents with   Lower Back - Pain   HPI:  Robin Franklin is a 60 y.o. female who comes in today at the request of Dr. Eunice Blase for planned Bilateral L4-5 Lumbar Transforaminal epidural steroid injection with fluoroscopic guidance.  The patient has failed conservative care including home exercise, medications, time and activity modification.  This injection will be diagnostic and hopefully therapeutic.  Please see requesting physician notes for further details and justification. MRI reviewed with images and spine model.  MRI reviewed in the note below.  Partially sacralized L5 segment.      ROS Otherwise per HPI.  Assessment & Plan: Visit Diagnoses:    ICD-10-CM   1. Lumbar radiculopathy  M54.16 XR C-ARM NO REPORT    Epidural Steroid injection    methylPREDNISolone acetate (DEPO-MEDROL) injection 80 mg      Plan: No additional findings.   Meds & Orders:  Meds ordered this encounter  Medications   methylPREDNISolone acetate (DEPO-MEDROL) injection 80 mg    Orders Placed This Encounter  Procedures   XR C-ARM NO REPORT   Epidural Steroid injection    Follow-up: Return if symptoms worsen or fail to improve.   Procedures: No procedures performed  Lumbosacral Transforaminal Epidural Steroid Injection - Sub-Pedicular Approach with Fluoroscopic Guidance  Patient: Robin Franklin      Date of Birth: February 05, 1961 MRN: [id number] PCP: Eunice Blase, MD      Visit Date: 07/19/2021   Universal Protocol:    Date/Time: 07/19/2021  Consent Given By: the patient  Position: PRONE  Additional Comments: Vital signs were monitored before and after the procedure. Patient was prepped and draped in the usual sterile fashion. The correct patient, procedure, and  site was verified.   Injection Procedure Details:   Procedure diagnoses: Lumbar radiculopathy [M54.16]    Meds Administered:  Meds ordered this encounter  Medications   methylPREDNISolone acetate (DEPO-MEDROL) injection 80 mg    Laterality: Bilateral  Location/Site:  L4 partially sacralized L5 segment  Needle:5.0 in., 22 ga.  Short bevel or Quincke spinal needle  Needle Placement: Transforaminal  Findings:    -Comments: Excellent flow of contrast along the nerve, nerve root and into the epidural space.  Procedure Details: After squaring off the end-plates to get a true AP view, the C-arm was positioned so that an oblique view of the foramen as noted above was visualized. The target area is just inferior to the "nose of the scotty dog" or sub pedicular. The soft tissues overlying this structure were infiltrated with 2-3 ml. of 1% Lidocaine without Epinephrine.  The spinal needle was inserted toward the target using a "trajectory" view along the fluoroscope beam.  Under AP and lateral visualization, the needle was advanced so it did not puncture dura and was located close the 6 O'Clock position of the pedical in AP tracterory. Biplanar projections were used to confirm position. Aspiration was confirmed to be negative for CSF and/or blood. A 1-2 ml. volume of Isovue-250 was injected and flow of contrast was noted at each level. Radiographs were obtained for documentation purposes.   After attaining the desired flow of contrast documented above, a 0.5 to 1.0 ml test dose of 0.25% Marcaine was injected into each respective transforaminal space.  The patient was observed for  90 seconds post injection.  After no sensory deficits were reported, and normal lower extremity motor function was noted,   the above injectate was administered so that equal amounts of the injectate were placed at each foramen (level) into the transforaminal epidural space.   Additional Comments:  No complications  occurred Dressing: 2 x 2 sterile gauze and Band-Aid    Post-procedure details: Patient was observed during the procedure. Post-procedure instructions were reviewed.  Patient left the clinic in stable condition.    Clinical History: MRI LUMBAR SPINE WITHOUT CONTRAST   TECHNIQUE: Multiplanar, multisequence MR imaging of the lumbar spine was performed. No intravenous contrast was administered.   COMPARISON:  04/16/2019   FINDINGS: Segmentation: Numbering is kept consistent with the prior study with partial sacralization of L5.   Alignment:  Similar anteroposterior alignment.   Vertebrae: Increased mild degenerative endplate irregularity at L2-L3. No substantial marrow edema. Unchanged benign STIR hyperintense lesion at S2.   Conus medullaris and cauda equina: Conus extends to the L1 level. Conus and cauda equina appear normal.   Paraspinal and other soft tissues: Unremarkable.   Disc levels:   L1-L2:  Minimal disc bulge.  No canal or foraminal stenosis.   L2-L3: Mild disc bulge. No canal stenosis. Minor foraminal stenosis.   L3-L4: Mild disc bulge with endplate osteophytic ridging eccentric to the right. No canal stenosis. Minor right foraminal stenosis. No left foraminal stenosis.   L4-L5: Disc bulge with endplate osteophytic ridging eccentric to the left. Mild facet arthropathy. Disc protrusion on the prior study has resolved. No canal stenosis. No right foraminal stenosis. Minor left foraminal stenosis.   L5-S1: Imaged in the sagittal plane only. Hypoplastic disc. No canal or foraminal stenosis.   IMPRESSION: Multilevel degenerative changes as detailed above. No high-grade canal or foraminal stenosis.     Electronically Signed   By: Macy Mis M.D.   On: 06/20/2021 15:24     Objective:  VS:  HT:    WT:   BMI:     BP:128/85  HR:75bpm  TEMP: ( )  RESP:  Physical Exam Vitals and nursing note reviewed.  Constitutional:      General: She is not  in acute distress.    Appearance: Normal appearance. She is not ill-appearing.  HENT:     Head: Normocephalic and atraumatic.     Right Ear: External ear normal.     Left Ear: External ear normal.  Eyes:     Extraocular Movements: Extraocular movements intact.  Cardiovascular:     Rate and Rhythm: Normal rate.     Pulses: Normal pulses.  Pulmonary:     Effort: Pulmonary effort is normal. No respiratory distress.  Abdominal:     General: There is no distension.     Palpations: Abdomen is soft.  Musculoskeletal:        General: Tenderness present.     Cervical back: Neck supple.     Right lower leg: No edema.     Left lower leg: No edema.     Comments: Patient has good distal strength with no pain over the greater trochanters.  No clonus or focal weakness.  Skin:    Findings: No erythema, lesion or rash.  Neurological:     General: No focal deficit present.     Mental Status: She is alert and oriented to person, place, and time.     Sensory: No sensory deficit.     Motor: No weakness or abnormal muscle tone.  Coordination: Coordination normal.  Psychiatric:        Mood and Affect: Mood normal.        Behavior: Behavior normal.     Imaging: No results found.

## 2021-08-05 NOTE — Procedures (Signed)
Lumbosacral Transforaminal Epidural Steroid Injection - Sub-Pedicular Approach with Fluoroscopic Guidance  Patient: Robin Franklin      Date of Birth: 23-Feb-1961 MRN: [id number] PCP: Eunice Blase, MD      Visit Date: 07/19/2021   Universal Protocol:    Date/Time: 07/19/2021  Consent Given By: the patient  Position: PRONE  Additional Comments: Vital signs were monitored before and after the procedure. Patient was prepped and draped in the usual sterile fashion. The correct patient, procedure, and site was verified.   Injection Procedure Details:   Procedure diagnoses: Lumbar radiculopathy [M54.16]    Meds Administered:  Meds ordered this encounter  Medications   methylPREDNISolone acetate (DEPO-MEDROL) injection 80 mg    Laterality: Bilateral  Location/Site:  L4 partially sacralized L5 segment  Needle:5.0 in., 22 ga.  Short bevel or Quincke spinal needle  Needle Placement: Transforaminal  Findings:    -Comments: Excellent flow of contrast along the nerve, nerve root and into the epidural space.  Procedure Details: After squaring off the end-plates to get a true AP view, the C-arm was positioned so that an oblique view of the foramen as noted above was visualized. The target area is just inferior to the "nose of the scotty dog" or sub pedicular. The soft tissues overlying this structure were infiltrated with 2-3 ml. of 1% Lidocaine without Epinephrine.  The spinal needle was inserted toward the target using a "trajectory" view along the fluoroscope beam.  Under AP and lateral visualization, the needle was advanced so it did not puncture dura and was located close the 6 O'Clock position of the pedical in AP tracterory. Biplanar projections were used to confirm position. Aspiration was confirmed to be negative for CSF and/or blood. A 1-2 ml. volume of Isovue-250 was injected and flow of contrast was noted at each level. Radiographs were obtained for documentation  purposes.   After attaining the desired flow of contrast documented above, a 0.5 to 1.0 ml test dose of 0.25% Marcaine was injected into each respective transforaminal space.  The patient was observed for 90 seconds post injection.  After no sensory deficits were reported, and normal lower extremity motor function was noted,   the above injectate was administered so that equal amounts of the injectate were placed at each foramen (level) into the transforaminal epidural space.   Additional Comments:  No complications occurred Dressing: 2 x 2 sterile gauze and Band-Aid    Post-procedure details: Patient was observed during the procedure. Post-procedure instructions were reviewed.  Patient left the clinic in stable condition.

## 2021-10-25 ENCOUNTER — Encounter: Payer: Self-pay | Admitting: *Deleted

## 2021-11-28 ENCOUNTER — Encounter: Payer: Self-pay | Admitting: *Deleted

## 2021-12-20 IMAGING — MG MM BREAST BX W LOC DEV 1ST LESION IMAGE BX SPEC STEREO GUIDE*L*
8 of 10 series · 8 of 22 positions shown · non-contrast
Comparison: Previous exams.
COMPARISON: Previous exams.

Addendum:
CLINICAL DATA: 60-year-old female with an indeterminate left breast
asymmetry.

EXAM:
LEFT BREAST STEREOTACTIC CORE NEEDLE BIOPSY

[L (1 of 6)]
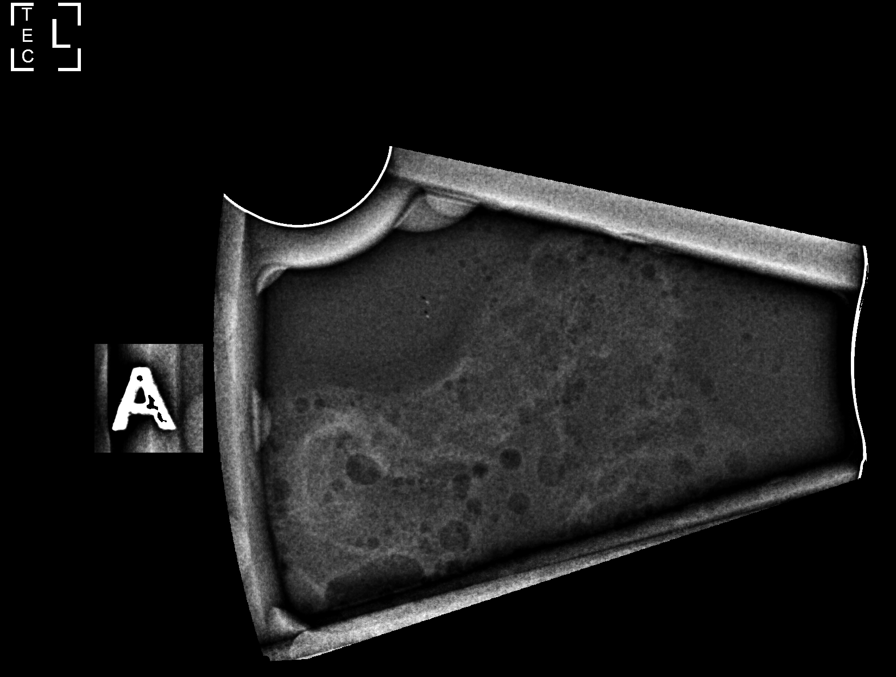

[L (2 of 6)]
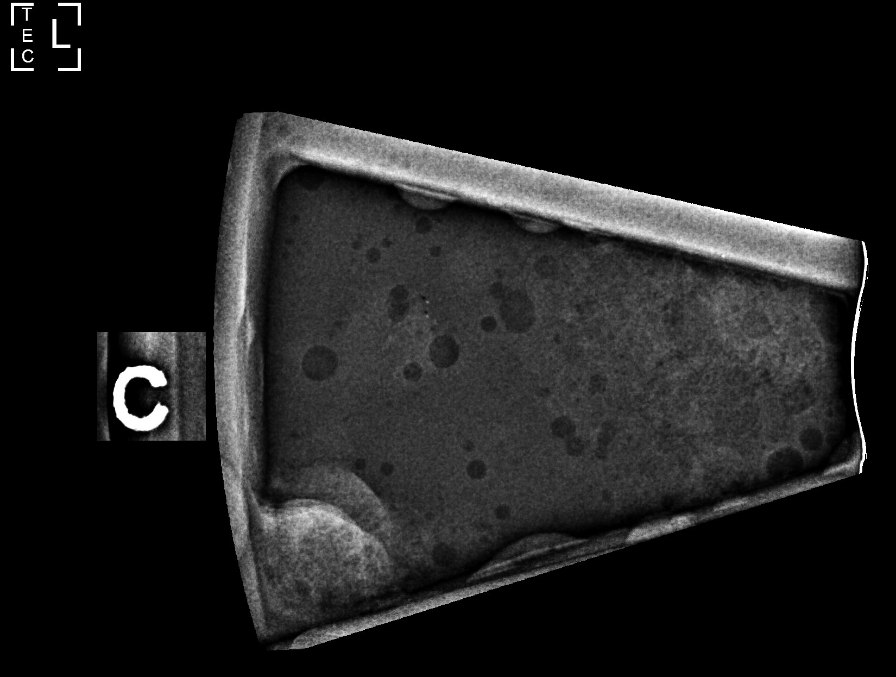

[L (3 of 6)]
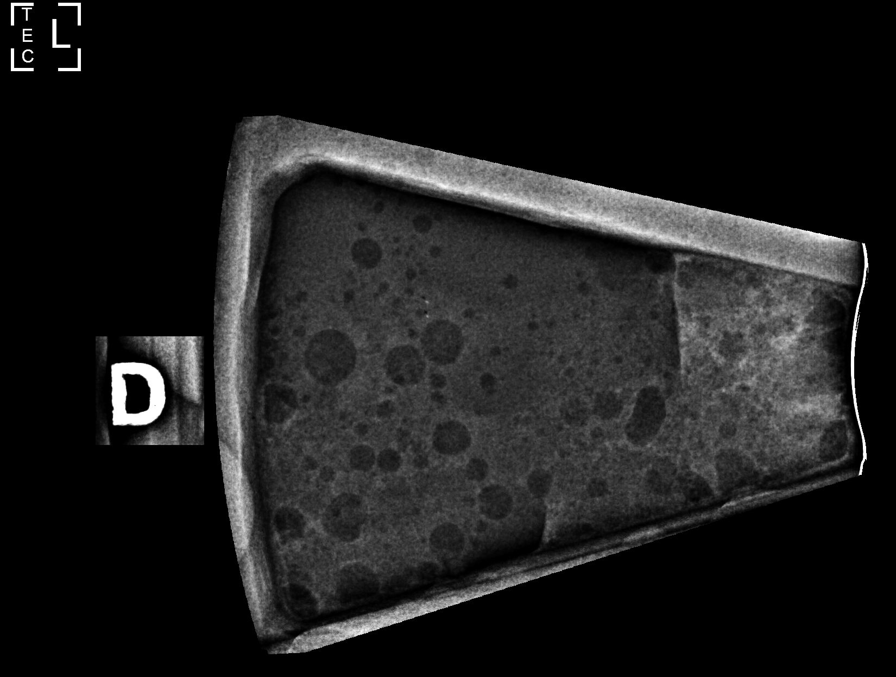

[L (4 of 6)]
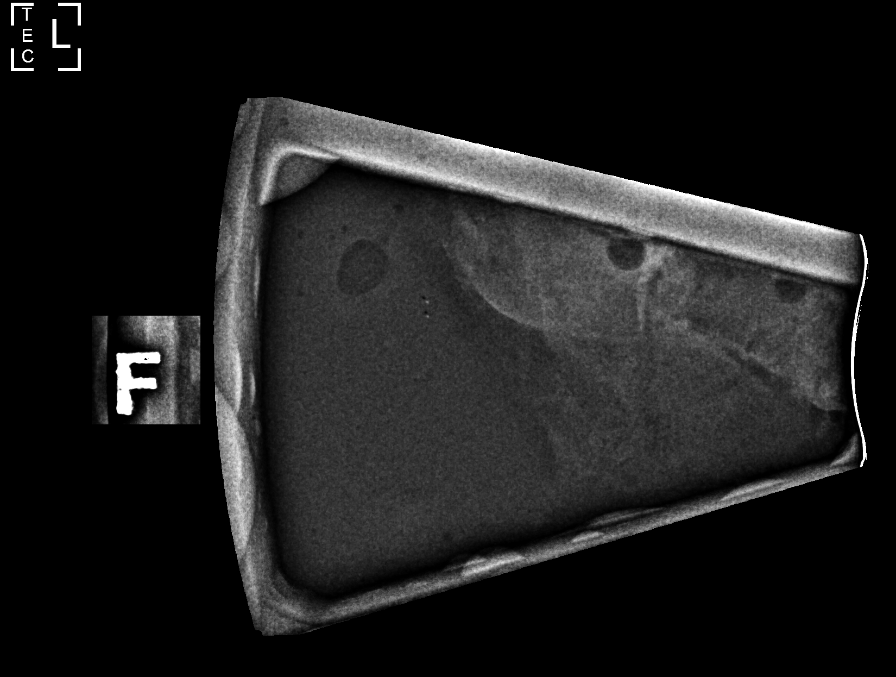

[L (5 of 6)]
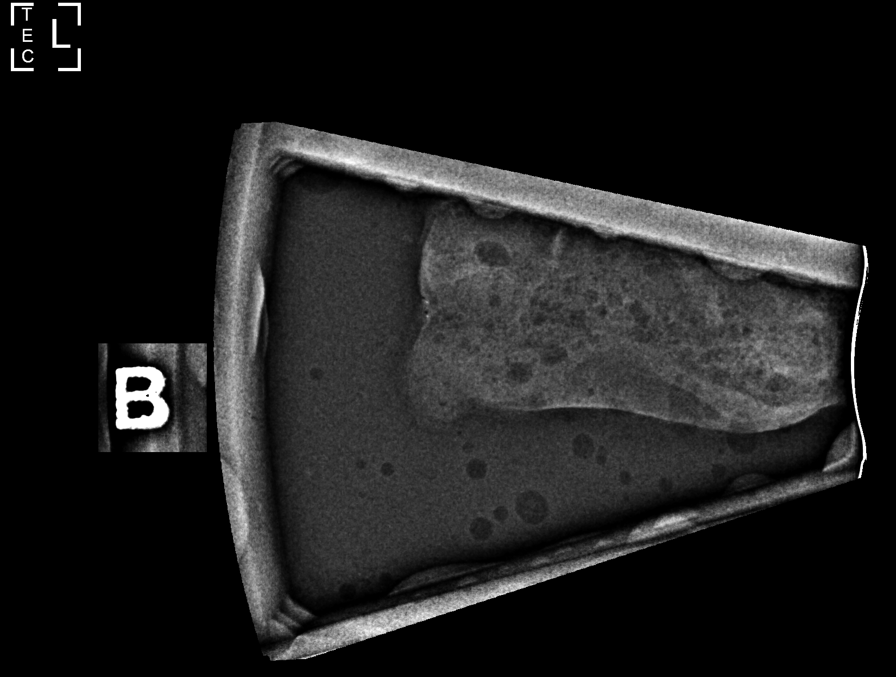

[L (6 of 6)]
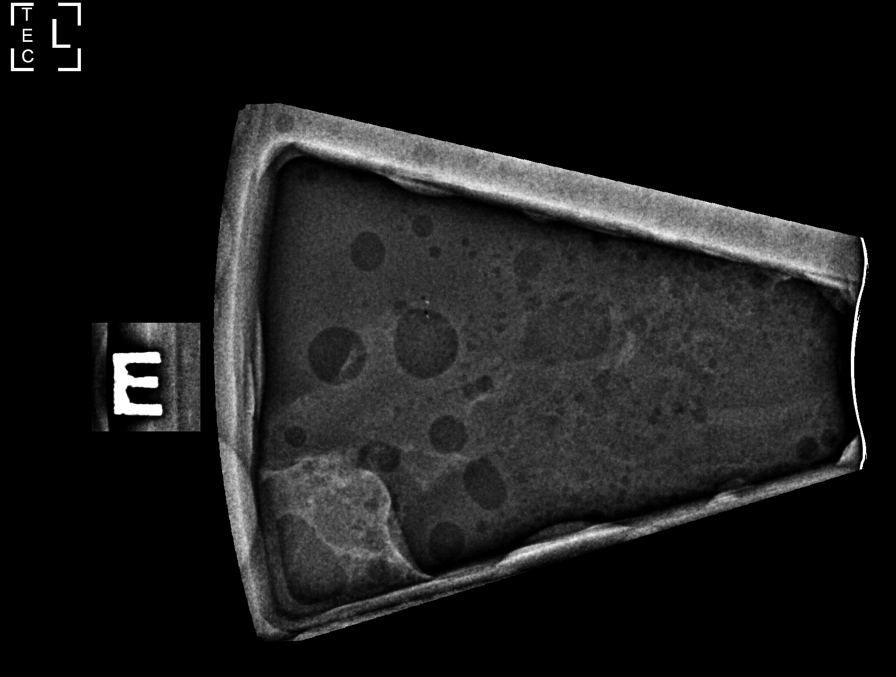

[L CC]
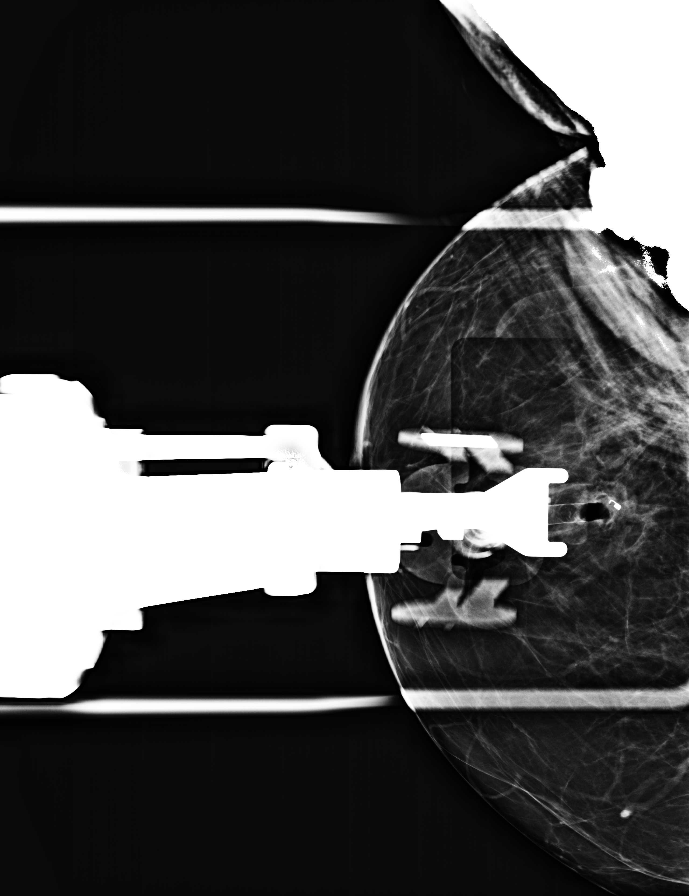

[L CC tomo · tomo slice 39/78.0]
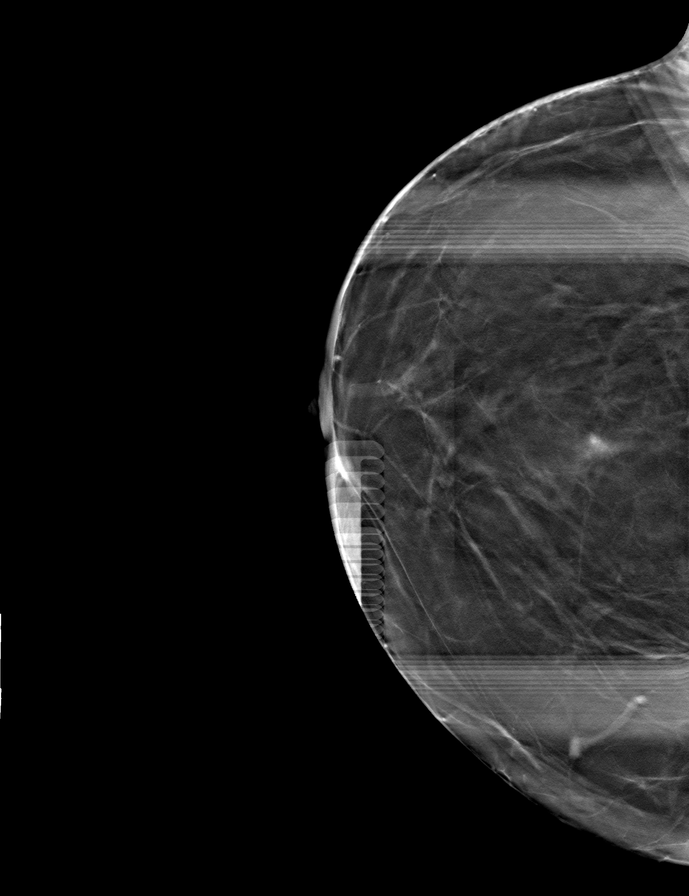

[8 of 22 positions shown; findings below may reference images not displayed]



Using sterile technique and 1% Lidocaine as local anesthetic, under
stereotactic guidance, a 9 gauge vacuum assisted device was used to
perform core needle biopsy of an asymmetry in the upper left breast
using a superior approach.

Lesion quadrant: Upper outer quadrant

At the conclusion of the procedure, a coil shaped tissue marker clip
was deployed into the biopsy cavity. Follow-up 2-view mammogram was
performed and dictated separately.
IMPRESSION: Stereotactic-guided biopsy of the left breast. No apparent
complications.

ADDENDUM:
Pathology revealed FAT NECROSIS of the LEFT breast, upper central.
NO EVIDENCE OF MALIGNANCY. This was found to be concordant by Dr.
Epy Otai.

Pathology results were discussed with the patient by telephone. The
patient reported doing well after the biopsy with tenderness at the
site. Post biopsy instructions and care were reviewed and questions
were answered. The patient was encouraged to call The [REDACTED]

The patient was instructed to return for left diagnostic mammography
and possible ultrasound in 6 months and informed a reminder notice
would be sent regarding this appointment.

Pathology results reported by Ryosei Sagano RN on 01/03/2021.



Using sterile technique and 1% Lidocaine as local anesthetic, under
stereotactic guidance, a 9 gauge vacuum assisted device was used to
perform core needle biopsy of an asymmetry in the upper left breast
using a superior approach.

Lesion quadrant: Upper outer quadrant

At the conclusion of the procedure, a coil shaped tissue marker clip
was deployed into the biopsy cavity. Follow-up 2-view mammogram was
performed and dictated separately.
IMPRESSION: Stereotactic-guided biopsy of the left breast. No apparent
complications.

## 2021-12-21 ENCOUNTER — Other Ambulatory Visit: Payer: Self-pay

## 2021-12-21 ENCOUNTER — Ambulatory Visit (INDEPENDENT_AMBULATORY_CARE_PROVIDER_SITE_OTHER): Payer: Self-pay | Admitting: *Deleted

## 2021-12-21 VITALS — Ht 66.0 in | Wt 167.0 lb

## 2021-12-21 DIAGNOSIS — Z1211 Encounter for screening for malignant neoplasm of colon: Secondary | ICD-10-CM

## 2021-12-21 MED ORDER — NA SULFATE-K SULFATE-MG SULF 17.5-3.13-1.6 GM/177ML PO SOLN: 1.0000 | Freq: Once | ORAL | 0 refills | Status: AC

## 2021-12-21 NOTE — Progress Notes (Addendum)
Gastroenterology Pre-Procedure Review  Request Date: 12/21/2021 Requesting Physician: 10 year recall, Last TCS 11/06/2011 done by Dr. Oneida Alar, internal hemorrhoids, mild diverticulosis   PATIENT REVIEW QUESTIONS: The patient responded to the following health history questions as indicated:    1. Diabetes Melitis: no 2. Joint replacements in the past 12 months: no 3. Major health problems in the past 3 months: yes, back trouble, managed by [facility], gets injections- last one 1 week ago 4. Has an artificial valve or MVP: no 5. Has a defibrillator: no 6. Has been advised in past to take antibiotics in advance of a procedure like teeth cleaning: no 7. Family history of colon cancer: no  8. Alcohol Use: no 9. Illicit drug Use: no 10. History of sleep apnea: yes, but doesn't use CPAP  11. History of coronary artery or other vascular stents placed within the last 12 months: no 12. History of any prior anesthesia complications: no 13. Body mass index is 26.95 kg/m.    MEDICATIONS & ALLERGIES:    Patient reports the following regarding taking any blood thinners:   Plavix? no Aspirin? no Coumadin? no Brilinta? no Xarelto? no Eliquis? no Pradaxa? no Savaysa? no Effient? no  Patient confirms/reports the following medications:  Current Outpatient Medications  Medication Sig Dispense Refill   ALPRAZolam (XANAX) 0.5 MG tablet Take 1 tablet (0.5 mg total) by mouth at bedtime as needed for anxiety. 30 tablet 1   ascorbic acid (VITAMIN C) 500 MG tablet Take by mouth.     cetirizine (ZYRTEC) 10 MG chewable tablet Chew 10 mg by mouth as needed for allergies.     Cholecalciferol (VITAMIN D3) 125 MCG (5000 UT) CAPS Take by mouth daily.     cyclobenzaprine (FLEXERIL) 10 MG tablet Take 1 tablet (10 mg total) by mouth 3 (three) times daily as needed for muscle spasms. (Patient taking differently: Take 10 mg by mouth as needed for muscle spasms.) 90 tablet 3   levothyroxine (SYNTHROID) 75 MCG  tablet TAKE 1 TABLET BY MOUTH DAILY BEFORE BREAKFAST. 90 tablet 1   MAGNESIUM GLUCONATE PO Take by mouth at bedtime.     meloxicam (MOBIC) 7.5 MG tablet TAKE 1 TABLET BY MOUTH 2 TIMES DAILY AS NEEDED FOR PAIN. (Patient taking differently: as needed.) 60 tablet 3   Multiple Vitamins-Minerals (MULTIVITAMIN ADULT) CHEW Chew by mouth daily at 6 (six) AM.     omeprazole (PRILOSEC) 40 MG capsule Take 40 mg by mouth daily.     Quercetin 500 MG CAPS Take by mouth daily at 6 (six) AM.     Selenium 100 MCG CAPS Take 1 capsule by mouth daily.     vitamin B-12 (CYANOCOBALAMIN) 1000 MCG tablet Take 1,000 mcg by mouth daily.     vitamin k 100 MCG tablet Take 100 mcg by mouth daily.     No current facility-administered medications for this visit.    Patient confirms/reports the following allergies:  Allergies  Allergen Reactions   Other Rash    Unknown Flu Vaccine from 2015 caused rash    No orders of the defined types were placed in this encounter.   [place] INFORMATION Primary Insurance: [place],  ID #: [id number],  Group #: [id number] Pre-Cert / Josem Kaufmann required: Yes, approved online 6/72/0947-0/96/2836 Pre-Cert / Josem Kaufmann #: [id number]  SCHEDULE INFORMATION: Procedure has been scheduled as follows:  Date: 01/09/2022, Time: 12:00 Location: [facility] with Dr. Abbey Chatters  This Gastroenterology Pre-Precedure Review Form is being routed to the following provider(s): Aliene Altes, PA-C

## 2021-12-21 NOTE — Progress Notes (Signed)
OK to schedule. ASA II 

## 2021-12-22 ENCOUNTER — Encounter: Payer: Self-pay | Admitting: *Deleted

## 2021-12-22 NOTE — Progress Notes (Signed)
Spoke to pt.  Scheduled procedure for 01/09/2022 with arrival at 10:30.  Reviewed prep instructions with pt.  Pt made aware that I will mail out a copy and they will be available in my chart. Pt confirmed mailing address.

## 2021-12-25 ENCOUNTER — Other Ambulatory Visit: Payer: Self-pay | Admitting: *Deleted

## 2022-01-08 ENCOUNTER — Telehealth: Payer: Self-pay | Admitting: *Deleted

## 2022-01-08 NOTE — Telephone Encounter (Signed)
Spoke to pt.  She informed me that she got mixed up and took bottle number 1 of Suprep last night at 6:00.  She has been on all clear liquids since yesterday.  She wanted to know if still ok to proceed with procedure.  Spoke to Dr. Abbey Chatters and explained situation.  He informed me to let pt know to do bottle number 2 of Suprep this evening.  Pt was informed.  She said her time got moved up tomorrow and she is to arrive at 9:30 at [facility].  With the information given, she was advised to cut off all liquids at 8:00 am in the morning.  She voiced understanding.

## 2022-01-09 ENCOUNTER — Ambulatory Visit (HOSPITAL_COMMUNITY): Payer: Medicare HMO | Admitting: Anesthesiology

## 2022-01-09 ENCOUNTER — Other Ambulatory Visit: Payer: Self-pay

## 2022-01-09 ENCOUNTER — Encounter (HOSPITAL_COMMUNITY)
Admission: RE | Disposition: A | Discharge status: Home / Self Care | Payer: Self-pay | Source: Home / Self Care | Attending: Internal Medicine

## 2022-01-09 ENCOUNTER — Ambulatory Visit (HOSPITAL_BASED_OUTPATIENT_CLINIC_OR_DEPARTMENT_OTHER): Payer: Medicare HMO | Admitting: Anesthesiology

## 2022-01-09 ENCOUNTER — Encounter (HOSPITAL_COMMUNITY): Payer: Self-pay

## 2022-01-09 ENCOUNTER — Ambulatory Visit (HOSPITAL_COMMUNITY)
Admission: RE | Admit: 2022-01-09 | Discharge: 2022-01-09 | Disposition: A | Discharge status: Home / Self Care | Payer: Medicare HMO | Attending: Internal Medicine | Admitting: Internal Medicine

## 2022-01-09 DIAGNOSIS — M1990 Unspecified osteoarthritis, unspecified site: Secondary | ICD-10-CM | POA: Diagnosis not present

## 2022-01-09 DIAGNOSIS — E039 Hypothyroidism, unspecified: Secondary | ICD-10-CM | POA: Insufficient documentation

## 2022-01-09 DIAGNOSIS — G4730 Sleep apnea, unspecified: Secondary | ICD-10-CM | POA: Insufficient documentation

## 2022-01-09 DIAGNOSIS — K648 Other hemorrhoids: Secondary | ICD-10-CM | POA: Insufficient documentation

## 2022-01-09 DIAGNOSIS — F419 Anxiety disorder, unspecified: Secondary | ICD-10-CM | POA: Insufficient documentation

## 2022-01-09 DIAGNOSIS — K5730 Diverticulosis of large intestine without perforation or abscess without bleeding: Secondary | ICD-10-CM | POA: Diagnosis not present

## 2022-01-09 DIAGNOSIS — K219 Gastro-esophageal reflux disease without esophagitis: Secondary | ICD-10-CM | POA: Insufficient documentation

## 2022-01-09 DIAGNOSIS — Z1211 Encounter for screening for malignant neoplasm of colon: Secondary | ICD-10-CM

## 2022-01-09 HISTORY — PX: COLONOSCOPY WITH PROPOFOL: SHX5780

## 2022-01-09 SURGERY — COLONOSCOPY WITH PROPOFOL
Anesthesia: General

## 2022-01-09 MED ORDER — LACTATED RINGERS IV SOLN: INTRAVENOUS | Status: DC | PRN

## 2022-01-09 MED ORDER — PROPOFOL 10 MG/ML IV BOLUS
INTRAVENOUS | Status: DC | PRN
  Administered 2022-01-09: 100 mg via INTRAVENOUS

## 2022-01-09 MED ORDER — PROPOFOL 500 MG/50ML IV EMUL
INTRAVENOUS | Status: DC | PRN
Start: 2022-01-09 — End: 2022-01-09
  Administered 2022-01-09: 125 ug/kg/min via INTRAVENOUS

## 2022-01-09 MED ORDER — LACTATED RINGERS IV SOLN
INTRAVENOUS | Status: DC
  Administered 2022-01-09: 1000 mL via INTRAVENOUS

## 2022-01-09 MED ORDER — LIDOCAINE HCL (CARDIAC) PF 100 MG/5ML IV SOSY
PREFILLED_SYRINGE | INTRAVENOUS | Status: DC | PRN
  Administered 2022-01-09: 60 mg via INTRATRACHEAL

## 2022-01-09 NOTE — Op Note (Signed)
[facility] Patient Name: Robin Franklin Procedure Date: 01/09/2022 9:37 AM MRN: [id number] Date of Birth: 04/01/1961 Attending MD: Elon Alas. Abbey Chatters DO CSN: [id number] Age: 61 Admit Type: Outpatient Procedure:                Colonoscopy Indications:              Screening for colorectal malignant neoplasm Providers:                Elon Alas. Abbey Chatters, DO, Crystal Page, Raphael Gibney,                            Technician Referring MD:              Medicines:                See the Anesthesia note for documentation of the                            administered medications Complications:            No immediate complications. Estimated Blood Loss:     Estimated blood loss: none. Procedure:                Pre-Anesthesia Assessment:                           - The anesthesia plan was to use monitored                            anesthesia care (MAC).                           After obtaining informed consent, the colonoscope                            was passed under direct vision. Throughout the                            procedure, the patient's blood pressure, pulse, and                            oxygen saturations were monitored continuously. The                            [id number]-[id number] ([id number]) was introduced through the                            anus and advanced to the the cecum, identified by                            appendiceal orifice and ileocecal valve. The                            colonoscopy was performed without difficulty. The                            patient tolerated the procedure well. The quality  of the bowel preparation was evaluated using the                            BBPS [facility] Bowel Preparation Scale) with scores                            of: Right Colon = 2 (minor amount of residual                            staining, small fragments of stool and/or opaque                            liquid, but mucosa seen well), Transverse  Colon = 2                            (minor amount of residual staining, small fragments                            of stool and/or opaque liquid, but mucosa seen                            well) and Left Colon = 3 (entire mucosa seen well                            with no residual staining, small fragments of stool                            or opaque liquid). The total BBPS score equals 7.                            The quality of the bowel preparation was fair. Scope In: 10:39:11 AM Scope Out: 10:54:20 AM Scope Withdrawal Time: 0 hours 10 minutes 25 seconds  Total Procedure Duration: 0 hours 15 minutes 9 seconds  Findings:      The perianal and digital rectal examinations were normal.      Non-bleeding internal hemorrhoids were found during endoscopy.      Multiple small-mouthed diverticula were found in the sigmoid colon.      The exam was otherwise without abnormality. Impression:               - Preparation of the colon was fair.                           - Non-bleeding internal hemorrhoids.                           - Diverticulosis in the sigmoid colon.                           - The examination was otherwise normal.                           - No specimens collected. Moderate Sedation:      Per Anesthesia Care Recommendation:           -  Patient has a contact number available for                            emergencies. The signs and symptoms of potential                            delayed complications were discussed with the                            patient. Return to normal activities tomorrow.                            Written discharge instructions were provided to the                            patient.                           - Resume previous diet.                           - Continue present medications.                           - Repeat colonoscopy in 10 years for screening                            purposes.                           - Return to GI clinic  PRN. Procedure Code(s):        --- Professional ---                           J5009, Colorectal cancer screening; colonoscopy on                            individual not meeting criteria for high risk Diagnosis Code(s):        --- Professional ---                           Z12.11, Encounter for screening for malignant                            neoplasm of colon                           K64.8, Other hemorrhoids                           K57.30, Diverticulosis of large intestine without                            perforation or abscess without bleeding CPT copyright 2019 American Medical Association. All rights reserved. The codes documented in this report are preliminary and upon coder review may  be revised to meet current compliance requirements. Juanda Crumble  Arlee Muslim, DO Elon Alas. Abbey Chatters, DO 01/09/2022 10:56:57 AM This report has been signed electronically. Number of Addenda: 0

## 2022-01-09 NOTE — Anesthesia Preprocedure Evaluation (Signed)
Anesthesia Evaluation  Patient identified by MRN, date of birth, ID band Patient awake    Reviewed: Allergy & Precautions, NPO status , Patient's Chart, lab work & pertinent test results  Airway Mallampati: II  TM Distance: >3 FB Neck ROM: Full    Dental  (+) Missing, Dental Advisory Given   Pulmonary sleep apnea (doesn't use CPAP everyday) and Continuous Positive Airway Pressure Ventilation ,    Pulmonary exam normal breath sounds clear to auscultation       Cardiovascular negative cardio ROS Normal cardiovascular exam Rhythm:Regular Rate:Normal     Neuro/Psych  Headaches, PSYCHIATRIC DISORDERS Anxiety  Neuromuscular disease    GI/Hepatic Neg liver ROS, GERD  Medicated and Controlled,  Endo/Other  Hypothyroidism   Renal/GU negative Renal ROS  negative genitourinary   Musculoskeletal  (+) Arthritis , Osteoarthritis,    Abdominal   Peds negative pediatric ROS (+)  Hematology negative hematology ROS (+)   Anesthesia Other Findings Back pain Left breast cancer   Reproductive/Obstetrics negative OB ROS                             Anesthesia Physical Anesthesia Plan  ASA: 2  Anesthesia Plan: General   Post-op Pain Management: Minimal or no pain anticipated   Induction: Intravenous  PONV Risk Score and Plan: TIVA  Airway Management Planned: Nasal Cannula and Natural Airway  Additional Equipment:   Intra-op Plan:   Post-operative Plan:   Informed Consent: I have reviewed the patients History and Physical, chart, labs and discussed the procedure including the risks, benefits and alternatives for the proposed anesthesia with the patient or authorized representative who has indicated his/her understanding and acceptance.     Dental advisory given  Plan Discussed with: CRNA and Surgeon  Anesthesia Plan Comments:         Anesthesia Quick Evaluation

## 2022-01-09 NOTE — Anesthesia Postprocedure Evaluation (Signed)
Anesthesia Post Note  Patient: Robin Franklin  Procedure(s) Performed: COLONOSCOPY WITH PROPOFOL  Patient location during evaluation: Endoscopy Anesthesia Type: General Level of consciousness: awake and alert and oriented Pain management: pain level controlled Vital Signs Assessment: post-procedure vital signs reviewed and stable Respiratory status: spontaneous breathing, nonlabored ventilation and respiratory function stable Cardiovascular status: blood pressure returned to baseline and stable Postop Assessment: no apparent nausea or vomiting Anesthetic complications: no   No notable events documented.   Last Vitals:  Vitals:   01/09/22 1056 01/09/22 1101  BP: (!) 92/58 106/68  Pulse: 66 66  Resp: 13 17  Temp: 36.7 C   SpO2: 99% 100%    Last Pain:  Vitals:   01/09/22 1101  TempSrc:   PainSc: 0-No pain                 Shakti Fleer C Harika Laidlaw

## 2022-01-09 NOTE — Discharge Instructions (Addendum)
Colonoscopy Discharge Instructions  Read the instructions outlined below and refer to this sheet in the next few weeks. These discharge instructions provide you with general information on caring for yourself after you leave the hospital. Your doctor may also give you specific instructions. While your treatment has been planned according to the most current medical practices available, unavoidable complications occasionally occur.   ACTIVITY You may resume your regular activity, but move at a slower pace for the next 24 hours.  Take frequent rest periods for the next 24 hours.  Walking will help get rid of the air and reduce the bloated feeling in your belly (abdomen).  No driving for 24 hours (because of the medicine (anesthesia) used during the test).   Do not sign any important legal documents or operate any machinery for 24 hours (because of the anesthesia used during the test).  NUTRITION Drink plenty of fluids.  You may resume your normal diet as instructed by your doctor.  Begin with a light meal and progress to your normal diet. Heavy or fried foods are harder to digest and may make you feel sick to your stomach (nauseated).  Avoid alcoholic beverages for 24 hours or as instructed.  MEDICATIONS You may resume your normal medications unless your doctor tells you otherwise.  WHAT YOU CAN EXPECT TODAY Some feelings of bloating in the abdomen.  Passage of more gas than usual.  Spotting of blood in your stool or on the toilet paper.  IF YOU HAD POLYPS REMOVED DURING THE COLONOSCOPY: No aspirin products for 7 days or as instructed.  No alcohol for 7 days or as instructed.  Eat a soft diet for the next 24 hours.  FINDING OUT THE RESULTS OF YOUR TEST Not all test results are available during your visit. If your test results are not back during the visit, make an appointment with your caregiver to find out the results. Do not assume everything is normal if you have not heard from your  caregiver or the medical facility. It is important for you to follow up on all of your test results.  SEEK IMMEDIATE MEDICAL ATTENTION IF: You have more than a spotting of blood in your stool.  Your belly is swollen (abdominal distention).  You are nauseated or vomiting.  You have a temperature over 101.  You have abdominal pain or discomfort that is severe or gets worse throughout the day.   Your colonoscopy was relatively unremarkable.  I did not find any polyps or evidence of colon cancer.  I recommend repeating colonoscopy in 10 years for colon cancer screening purposes.  You do have diverticulosis and internal hemorrhoids. I would recommend increasing fiber in your diet or adding OTC Benefiber/Metamucil. Be sure to drink at least 4 to 6 glasses of water daily. Follow-up with GI as needed.   I hope you have a great rest of your week!  Elon Alas. Abbey Chatters, D.O. Gastroenterology and Hepatology [facility] PATIENT INSTRUCTIONS POST-ANESTHESIA  IMMEDIATELY FOLLOWING SURGERY:  Do not drive or operate machinery for the first twenty four hours after surgery.  Do not make any important decisions for twenty four hours after surgery or while taking narcotic pain medications or sedatives.  If you develop intractable nausea and vomiting or a severe headache please notify your doctor immediately.  FOLLOW-UP:  Please make an appointment with your surgeon as instructed. You do not need to follow up with anesthesia unless specifically instructed to do so.  WOUND CARE INSTRUCTIONS (if applicable):  Keep a dry clean dressing on the anesthesia/puncture wound site if there is drainage.  Once the wound has quit draining you may leave it open to air.  Generally you should leave the bandage intact for twenty four hours unless there is drainage.  If the epidural site drains for more than 36-48 hours please call the anesthesia department.  QUESTIONS?:  Please feel free to call your  physician or the hospital operator if you have any questions, and they will be happy to assist you.

## 2022-01-09 NOTE — H&P (Signed)
Primary Care Physician:  System, Provider Not In Primary Gastroenterologist:  Dr. Abbey Chatters  Pre-Procedure History & Physical: HPI:  Robin Franklin is a 61 y.o. female is here for a colonoscopy for colon cancer screening purposes.  Patient denies any family history of colorectal cancer.  No melena or hematochezia.  No abdominal pain or unintentional weight loss.  No change in bowel habits.  Overall feels well from a GI standpoint.  Past Medical History:  Diagnosis Date   Anxiety    Arthritis    Breast cancer ([place])    Family history of breast cancer    Headache(784.0)    Hypothyroidism    Joint pain    Personal history of chemotherapy    Personal history of radiation therapy    S/P radiation therapy 05/17/2015 through 06/24/2015                                                      Left breast 5040 cGy in 28 sessions; left supraclavicular/axillary region 5040 cGy in 28 sessions                          Past Surgical History:  Procedure Laterality Date   BREAST LUMPECTOMY Left 2015   BREAST REDUCTION SURGERY     CERVICAL CONE BIOPSY     COLONOSCOPY  11/06/2011   Procedure: COLONOSCOPY;  Surgeon: Dorothyann Peng, MD;  Location: [facility];  Service: Endoscopy;  Laterality: N/A;  11:30 AM   cyst removed from neck     REDUCTION MAMMAPLASTY Bilateral 2001   STERIOD INJECTION      Prior to Admission medications   Medication Sig Start Date End Date Taking? Authorizing Provider  ascorbic acid (VITAMIN C) 500 MG tablet Take 500 mg by mouth daily.   Yes [provider]  Bioflavonoid Products (GRAPE SEED PO) Take 1 capsule by mouth daily.   Yes [provider]  cetirizine (ZYRTEC) 10 MG tablet Take 10 mg by mouth daily as needed for allergies. 12/07/21  Yes [provider]  Cholecalciferol (VITAMIN D3) 125 MCG (5000 UT) CAPS Take 5,000 Units by mouth daily.   Yes [provider]  levothyroxine (SYNTHROID) 75 MCG tablet TAKE 1 TABLET BY MOUTH DAILY BEFORE  BREAKFAST. 05/02/21  Yes Hilts, Legrand Como, MD  MAGNESIUM GLUCONATE PO Take 2 each by mouth at bedtime.   Yes [provider]  Multiple Vitamins-Minerals (MULTIVITAMIN ADULT) CHEW Chew 2 each by mouth daily.   Yes [provider]  omeprazole (PRILOSEC) 40 MG capsule Take 40 mg by mouth daily. 12/19/21  Yes [provider]  ondansetron (ZOFRAN) 4 MG tablet Take 4 mg by mouth 4 (four) times daily as needed for nausea or vomiting. 12/19/21  Yes [provider]  Quercetin 500 MG CAPS Take 500 mg by mouth daily.   Yes [provider]  Selenium 100 MCG CAPS Take 100 mcg by mouth daily.   Yes [provider]  Turmeric 500 MG CAPS Take 500 mg by mouth daily.   Yes [provider]  vitamin B-12 (CYANOCOBALAMIN) 1000 MCG tablet Take 1,000 mcg by mouth daily.   Yes [provider]  vitamin k 100 MCG tablet Take 100 mcg by mouth daily.   Yes [provider]  ALPRAZolam Duanne Moron) 0.5  MG tablet Take 1 tablet (0.5 mg total) by mouth at bedtime as needed for anxiety. 05/01/21   Hilts, Legrand Como, MD  cyclobenzaprine (FLEXERIL) 10 MG tablet Take 1 tablet (10 mg total) by mouth 3 (three) times daily as needed for muscle spasms. 05/11/20   Hilts, Legrand Como, MD  fluticasone (FLONASE) 50 MCG/ACT nasal spray Place 1 spray into both nostrils daily as needed for allergies or rhinitis.    [provider]  meloxicam (MOBIC) 7.5 MG tablet TAKE 1 TABLET BY MOUTH 2 TIMES DAILY AS NEEDED FOR PAIN. Patient taking differently: Take 7.5 mg by mouth daily as needed for pain. 03/13/21   Hilts, Legrand Como, MD  Menthol-Camphor (ICY HOT ADVANCED PAIN RELIEF) 16-11 % CREA Apply 1 application topically daily as needed (pain).    [provider]    Allergies as of 12/22/2021 - Review Complete 12/21/2021  Allergen Reaction Noted   Other Rash 04/12/2015    Family History  Problem Relation Age of Onset   Breast cancer Mother 73   Heart attack Father     Heart attack Maternal Uncle    Cancer Maternal Uncle        NOS   Prostate cancer Maternal Uncle    Breast cancer Cousin        dx in her 4s   Colon cancer Neg Hx     Social History   Socioeconomic History   Marital status: Married    Spouse name: Not on file   Number of children: 2   Years of education: Not on file   Highest education level: Not on file  Occupational History   Not on file  Tobacco Use   Smoking status: Never   Smokeless tobacco: Never  Vaping Use   Vaping Use: Never used  Substance and Sexual Activity   Alcohol use: No   Drug use: No   Sexual activity: Yes  Other Topics Concern   Not on file  Social History Narrative   Not on file   Social Determinants of Health   Financial Resource Strain: Not on file  Food Insecurity: Not on file  Transportation Needs: Not on file  Physical Activity: Not on file  Stress: Not on file  Social Connections: Not on file  Intimate Partner Violence: Not on file    Review of Systems: See HPI, otherwise negative ROS  Physical Exam: Vital signs in last 24 hours:     General:   Alert,  Well-developed, well-nourished, pleasant and cooperative in NAD Head:  Normocephalic and atraumatic. Eyes:  Sclera clear, no icterus.   Conjunctiva pink. Ears:  Normal auditory acuity. Nose:  No deformity, discharge,  or lesions. Mouth:  No deformity or lesions, dentition normal. Neck:  Supple; no masses or thyromegaly. Lungs:  Clear throughout to auscultation.   No wheezes, crackles, or rhonchi. No acute distress. Heart:  Regular rate and rhythm; no murmurs, clicks, rubs,  or gallops. Abdomen:  Soft, nontender and nondistended. No masses, hepatosplenomegaly or hernias noted. Normal bowel sounds, without guarding, and without rebound.   Msk:  Symmetrical without gross deformities. Normal posture. Extremities:  Without clubbing or edema. Neurologic:  Alert and  oriented x4;  grossly normal neurologically. Skin:  Intact without  significant lesions or rashes. Cervical Nodes:  No significant cervical adenopathy. Psych:  Alert and cooperative. Normal mood and affect.  Impression/Plan: Robin Franklin is here for a colonoscopy to be performed for colon cancer screening purposes.  The risks of the procedure including infection,  bleed, or perforation as well as benefits, limitations, alternatives and imponderables have been reviewed with the patient. Questions have been answered. All parties agreeable.

## 2022-01-09 NOTE — Transfer of Care (Signed)
Immediate Anesthesia Transfer of Care Note  Patient: Robin Franklin  Procedure(s) Performed: COLONOSCOPY WITH PROPOFOL  Patient Location: Short Stay  Anesthesia Type:General  Level of Consciousness: sedated  Airway & Oxygen Therapy: Patient Spontanous Breathing  Post-op Assessment: Report given to RN and Post -op Vital signs reviewed and stable  Post vital signs: Reviewed and stable  Last Vitals:  Vitals Value Taken Time  BP    Temp    Pulse    Resp    SpO2      Last Pain:  Vitals:   01/09/22 1035  TempSrc:   PainSc: 0-No pain      Patients Stated Pain Goal: 5 (35/45/62 5638)  Complications: No notable events documented.

## 2022-01-11 ENCOUNTER — Encounter (HOSPITAL_COMMUNITY): Payer: Self-pay | Admitting: Internal Medicine

## 2022-03-16 ENCOUNTER — Other Ambulatory Visit: Payer: Self-pay | Admitting: Obstetrics & Gynecology

## 2022-03-16 DIAGNOSIS — N6489 Other specified disorders of breast: Secondary | ICD-10-CM

## 2022-03-16 DIAGNOSIS — R928 Other abnormal and inconclusive findings on diagnostic imaging of breast: Secondary | ICD-10-CM

## 2022-03-27 ENCOUNTER — Ambulatory Visit: Payer: Medicare HMO

## 2022-03-27 ENCOUNTER — Ambulatory Visit
Admission: RE | Admit: 2022-03-27 | Discharge: 2022-03-27 | Disposition: A | Discharge status: Home / Self Care | Payer: Medicare HMO | Source: Ambulatory Visit | Attending: Obstetrics & Gynecology | Admitting: Obstetrics & Gynecology

## 2022-03-27 DIAGNOSIS — R928 Other abnormal and inconclusive findings on diagnostic imaging of breast: Secondary | ICD-10-CM

## 2022-03-27 DIAGNOSIS — N6489 Other specified disorders of breast: Secondary | ICD-10-CM

## 2023-04-02 ENCOUNTER — Encounter: Payer: Self-pay | Admitting: Physical Medicine and Rehabilitation

## 2023-04-02 ENCOUNTER — Ambulatory Visit: Payer: Medicare HMO | Admitting: Physical Medicine and Rehabilitation

## 2023-04-02 DIAGNOSIS — G8929 Other chronic pain: Secondary | ICD-10-CM | POA: Diagnosis not present

## 2023-04-02 DIAGNOSIS — M545 Low back pain, unspecified: Secondary | ICD-10-CM

## 2023-04-02 DIAGNOSIS — M47816 Spondylosis without myelopathy or radiculopathy, lumbar region: Secondary | ICD-10-CM | POA: Diagnosis not present

## 2023-04-02 NOTE — Progress Notes (Unsigned)
Robin Franklin - 62 y.o. female MRN [id number]  Date of birth: [birth year]  Office Visit Note: Visit Date: 04/02/2023 PCP: Ellyn Hack, MD Referred by: Ellyn Hack, MD  Subjective: Chief Complaint  Patient presents with   Lower Back - Pain   HPI: Robin Franklin is a 62 y.o. female who comes in today per the request Dr. Brayton El for evaluation of chronic, worsening and severe bilateral lower back pain, left greater than right. Pain ongoing for several years, worsens with bending over and performing household tasks. States she does take breaks frequently when standing to perform tasks. Reports severe pain when moving from sitting to standing. She describes pain as sore and aching sensation, currently rates as 8 out of 10. Some relief of pain with home exercise regimen, rest and use of medications. Currently attending formal chiropractic treatments in [place], minimal relief of pain with these treatments. Lumbar MRI imaging from 2022 exhibits mild facet arthropathy and disc bulge with endplate osteophytic ridging eccentric to the left at L4-L5. No high grade spinal canal or foraminal stenosis noted. History of lumbar epidural steroid injections with Dr. Letta Kocher with [facility] many years ago, she is unable to remember if this procedure alleviated her pain. She also underwent bilateral L4 transforaminal epidural steroid injection in our office on 07/19/2021 for more radicular symptoms, states good relief of pain with this procedure. Patient denies focal weakness, numbness and tingling. No recent trauma or falls.     Oswestry Disability Index Score 16% 0 to 10 (20%)  minimal disability: The patient can cope with most living activities. Usually no treatment is indicated apart from advice on lifting sitting and exercise.  Review of Systems  Musculoskeletal:  Positive for back pain.  Neurological:  Negative for tingling, sensory change, focal weakness and  weakness.  All other systems reviewed and are negative.  Otherwise per HPI.  Assessment & Plan: Visit Diagnoses:    ICD-10-CM   1. Chronic bilateral low back pain without sciatica  M54.50 Ambulatory referral to Physical Medicine Rehab   G89.29     2. Spondylosis without myelopathy or radiculopathy, lumbar region  M47.816 Ambulatory referral to Physical Medicine Rehab    3. Facet arthropathy, lumbar  M47.816 Ambulatory referral to Physical Medicine Rehab       Plan: Findings:  Chronic, worsening and severe bilateral lower back pain, left greater than right. Patient continues to have severe pain despite good conservative therapies such as home exercise regimen, rest and use of medications. Patients clinical presentation and exam are consistent with facet mediated pain. She does have pain with lumbar extension upon exam today. There is osteophyte ridging eccentric to the left at L4-L5. Next step is to perform bilateral L4-L5 facet joint injections under fluoroscopic guidance. If good relief of pain with facet joint injections we discussed possibility of longer sustained pain relief with radiofrequency ablation. I discussed injection procedure with patient today, she has no questions at this time. If her pain persists we could look at re-grouping with our in house physical therapy team, could also look at medication management. No red flag symptoms noted upon exam today.     Meds & Orders: No orders of the defined types were placed in this encounter.   Orders Placed This Encounter  Procedures   Ambulatory referral to Physical Medicine Rehab    Follow-up: Return for Bilateral L4-L5 facet joint injections.   Procedures: No procedures performed  Clinical History: CLINICAL DATA:  Lumbar radiculopathy, no red flags; acute left low back pain   EXAM: MRI LUMBAR SPINE WITHOUT CONTRAST   TECHNIQUE: Multiplanar, multisequence MR imaging of the lumbar spine was performed. No intravenous  contrast was administered.   COMPARISON:  04/16/2019   FINDINGS: Segmentation: Numbering is kept consistent with the prior study with partial sacralization of L5.   Alignment:  Similar anteroposterior alignment.   Vertebrae: Increased mild degenerative endplate irregularity at L2-L3. No substantial marrow edema. Unchanged benign STIR hyperintense lesion at S2.   Conus medullaris and cauda equina: Conus extends to the L1 level. Conus and cauda equina appear normal.   Paraspinal and other soft tissues: Unremarkable.   Disc levels:   L1-L2:  Minimal disc bulge.  No canal or foraminal stenosis.   L2-L3: Mild disc bulge. No canal stenosis. Minor foraminal stenosis.   L3-L4: Mild disc bulge with endplate osteophytic ridging eccentric to the right. No canal stenosis. Minor right foraminal stenosis. No left foraminal stenosis.   L4-L5: Disc bulge with endplate osteophytic ridging eccentric to the left. Mild facet arthropathy. Disc protrusion on the prior study has resolved. No canal stenosis. No right foraminal stenosis. Minor left foraminal stenosis.   L5-S1: Imaged in the sagittal plane only. Hypoplastic disc. No canal or foraminal stenosis.   IMPRESSION: Multilevel degenerative changes as detailed above. No high-grade canal or foraminal stenosis.     Electronically Signed   By: Guadlupe Spanish M.D.   On: 06/20/2021 15:24   She reports that she has never smoked. She has never used smokeless tobacco. No results for input(s): "HGBA1C", "LABURIC" in the last 8760 hours.  Objective:  VS:  HT:    WT:   BMI:     BP:   HR: bpm  TEMP: ( )  RESP:  Physical Exam Vitals and nursing note reviewed.  HENT:     Head: Normocephalic and atraumatic.     Right Ear: External ear normal.     Left Ear: External ear normal.     Nose: Nose normal.     Mouth/Throat:     Mouth: Mucous membranes are moist.  Eyes:     Extraocular Movements: Extraocular movements intact.   Cardiovascular:     Rate and Rhythm: Normal rate.     Pulses: Normal pulses.  Pulmonary:     Effort: Pulmonary effort is normal.  Abdominal:     General: Abdomen is flat. There is no distension.  Musculoskeletal:        General: Tenderness present.     Cervical back: Normal range of motion.     Comments: Patient rises from seated position to standing without difficulty. Concordant low back pain with facet loading, lumbar spine extension and rotation. 5/5 strength noted with bilateral hip flexion, knee flexion/extension, ankle dorsiflexion/plantarflexion and EHL. No clonus noted bilaterally. No pain upon palpation of greater trochanters. No pain with internal/external rotation of bilateral hips. Sensation intact bilaterally. Negative slump test bilaterally. Ambulates without aid, gait steady.    Skin:    General: Skin is warm and dry.     Capillary Refill: Capillary refill takes less than 2 seconds.  Neurological:     General: No focal deficit present.     Mental Status: She is alert and oriented to person, place, and time.  Psychiatric:        Mood and Affect: Mood normal.        Behavior: Behavior normal.     Ortho Exam  Imaging:  No results found.  Past Medical/Family/Surgical/Social History: Medications & Allergies reviewed per EMR, new medications updated. Patient Active Problem List   Diagnosis Date Noted   Vitamin D deficiency 11/09/2020   Irregular periods 07/04/2020   Menopausal symptom 07/04/2020   Uterine leiomyoma 07/04/2020   Positive ANA (antinuclear antibody) 05/11/2020   Situational anxiety 05/11/2020   Educated about COVID-19 virus infection 12/29/2019   Generalized abdominal pain 11/30/2019   Abnormal finding on urinalysis 11/30/2019   Pain of left hand 10/30/2019   Sleep apnea in adult 10/29/2019   Gastroesophageal reflux disease without esophagitis 08/04/2019   Hyperlipidemia 08/04/2019   Sleep disturbance 08/04/2019   Hypothyroidism 10/09/2017    History of malignant neoplasm of breast 06/14/2016   Genetic testing 11/29/2015   Family history of breast cancer    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   Chemotherapy-induced nausea 03/21/2015   Back pain 03/21/2015   Constipation 03/21/2015   Watery eyes 02/21/2015   Mucositis due to chemotherapy 02/21/2015   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, estrogen receptor negative (HCC) 11/28/2014   Lumbar radiculopathy 05/26/2012   Cervical intraepithelial neoplasia grade 1 11/19/2002   Past Medical History:  Diagnosis Date   Anxiety    Arthritis    Breast cancer (HCC)    Family history of breast cancer    Headache(784.0)    Hypothyroidism    Joint pain    Personal history of chemotherapy    Personal history of radiation therapy    S/P radiation therapy 05/17/2015 through 06/24/2015                                                      Left breast 5040 cGy in 28 sessions; left supraclavicular/axillary region 5040 cGy in 28 sessions                         Family History  Problem Relation Age of Onset   Breast cancer Mother 24   Heart attack Father    Heart attack Maternal Uncle    Cancer Maternal Uncle        NOS   Prostate cancer Maternal Uncle    Breast cancer Cousin        dx in her 47s   Colon cancer Neg Hx    Past Surgical History:  Procedure Laterality Date   BREAST BIOPSY Left 01/02/2021   BREAST LUMPECTOMY Left 2015   BREAST REDUCTION SURGERY     CERVICAL CONE BIOPSY     COLONOSCOPY  11/06/2011   Procedure: COLONOSCOPY;  Surgeon: Arlyce Harman, MD;  Location: [facility];  Service: Endoscopy;  Laterality: N/A;  11:30 AM   COLONOSCOPY WITH PROPOFOL N/A 01/09/2022   Procedure: COLONOSCOPY WITH PROPOFOL;  Surgeon: Lanelle Bal, DO;  Location: [facility];  Service: Endoscopy;  Laterality: N/A;  12:00 / ASA 2   cyst removed from neck     REDUCTION MAMMAPLASTY Bilateral 2001   STERIOD INJECTION     Social History    Occupational History   Not on file  Tobacco Use   Smoking status: Never   Smokeless tobacco: Never  Vaping Use   Vaping Use: Never used  Substance and Sexual Activity   Alcohol use: No   Drug use: No   Sexual  activity: Yes

## 2023-04-02 NOTE — Progress Notes (Unsigned)
Functional Pain Scale - descriptive words and definitions  Moderate (4)   Constantly aware of pain, can complete ADLs with modification/sleep marginally affected at times/passive distraction is of no use, but active distraction gives some relief. Moderate range order  Average Pain 4-5   Lower back pain that can be on both sides that can radiate in the legs. Pain gets worse with activities, like bending and twisting

## 2023-04-25 ENCOUNTER — Ambulatory Visit: Payer: Medicare HMO | Admitting: Physical Medicine and Rehabilitation

## 2023-04-25 ENCOUNTER — Other Ambulatory Visit: Payer: Self-pay

## 2023-04-25 VITALS — BP 130/85 | HR 55

## 2023-04-25 DIAGNOSIS — M47816 Spondylosis without myelopathy or radiculopathy, lumbar region: Secondary | ICD-10-CM

## 2023-04-25 MED ORDER — METHYLPREDNISOLONE ACETATE 80 MG/ML IJ SUSP
80.0000 mg | Freq: Once | INTRAMUSCULAR | Status: AC
Start: 2023-04-25 — End: 2023-04-25
  Administered 2023-04-25: 80 mg

## 2023-04-25 NOTE — Progress Notes (Signed)
Functional Pain Scale - descriptive words and definitions  Moderate (4)   Constantly aware of pain, can complete ADLs with modification/sleep marginally affected at times/passive distraction is of no use, but active distraction gives some relief. Moderate range order  Average Pain  varies on activities   +Driver, -BT, -Dye Allergies.  Lower back pain on both sides with no radiation

## 2023-04-25 NOTE — Patient Instructions (Signed)

## 2023-05-10 NOTE — Progress Notes (Signed)
Robin Franklin - 62 y.o. female MRN [id number]  Date of birth: [birth year]  Office Visit Note: Visit Date: 04/25/2023 PCP: Ellyn Hack, MD Referred by: Ellyn Hack, MD  Subjective: Chief Complaint  Patient presents with   Lower Back - Pain   HPI:  Robin Franklin is a 62 y.o. female who comes in today at the request of Ellin Goodie, FNP for planned Bilateral  L4-5 Lumbar facet/medial branch block with fluoroscopic guidance.  The patient has failed conservative care including home exercise, medications, time and activity modification.  This injection will be diagnostic and hopefully therapeutic.  Please see requesting physician notes for further details and justification.  Exam has shown concordant pain with facet joint loading.   ROS Otherwise per HPI.  Assessment & Plan: Visit Diagnoses:    ICD-10-CM   1. Spondylosis without myelopathy or radiculopathy, lumbar region  M47.816 XR C-ARM NO REPORT    Facet Injection    methylPREDNISolone acetate (DEPO-MEDROL) injection 80 mg      Plan: No additional findings.   Meds & Orders:  Meds ordered this encounter  Medications   methylPREDNISolone acetate (DEPO-MEDROL) injection 80 mg    Orders Placed This Encounter  Procedures   Facet Injection   XR C-ARM NO REPORT    Follow-up: Return for visit to requesting provider as needed.   Procedures: No procedures performed  Lumbar Facet Joint Intra-Articular Injection(s) with Fluoroscopic Guidance  Patient: Robin Franklin      Date of Birth: 12/19/1960 MRN: [id number] PCP: Ellyn Hack, MD      Visit Date: 04/25/2023   Universal Protocol:    Date/Time: 04/25/2023  Consent Given By: the patient  Position: PRONE   Additional Comments: Vital signs were monitored before and after the procedure. Patient was prepped and draped in the usual sterile fashion. The correct patient, procedure, and site was verified.   Injection Procedure Details:  Procedure  Site One Meds Administered:  Meds ordered this encounter  Medications   methylPREDNISolone acetate (DEPO-MEDROL) injection 80 mg     Laterality: Bilateral  Location/Site:  L4-L5  Needle size: 22 guage  Needle type: Spinal  Needle Placement: Articular  Findings:  -Comments: Excellent flow of contrast producing a partial arthrogram.  Procedure Details: The fluoroscope beam is vertically oriented in AP, and the inferior recess is visualized beneath the lower pole of the inferior apophyseal process, which represents the target point for needle insertion. When direct visualization is difficult the target point is located at the medial projection of the vertebral pedicle. The region overlying each aforementioned target is locally anesthetized with a 1 to 2 ml. volume of 1% Lidocaine without Epinephrine.   The spinal needle was inserted into each of the above mentioned facet joints using biplanar fluoroscopic guidance. A 0.25 to 0.5 ml. volume of Isovue-250 was injected and a partial facet joint arthrogram was obtained. A single spot film was obtained of the resulting arthrogram.    One to 1.25 ml of the steroid/anesthetic solution was then injected into each of the facet joints noted above.   Additional Comments:  No complications occurred Dressing: 2 x 2 sterile gauze and Band-Aid    Post-procedure details: Patient was observed during the procedure. Post-procedure instructions were reviewed.  Patient left the clinic in stable condition.    Clinical History: CLINICAL DATA:  Lumbar radiculopathy, no red flags; acute left low back pain   EXAM: MRI LUMBAR SPINE WITHOUT CONTRAST  TECHNIQUE: Multiplanar, multisequence MR imaging of the lumbar spine was performed. No intravenous contrast was administered.   COMPARISON:  04/16/2019   FINDINGS: Segmentation: Numbering is kept consistent with the prior study with partial sacralization of L5.   Alignment:  Similar  anteroposterior alignment.   Vertebrae: Increased mild degenerative endplate irregularity at L2-L3. No substantial marrow edema. Unchanged benign STIR hyperintense lesion at S2.   Conus medullaris and cauda equina: Conus extends to the L1 level. Conus and cauda equina appear normal.   Paraspinal and other soft tissues: Unremarkable.   Disc levels:   L1-L2:  Minimal disc bulge.  No canal or foraminal stenosis.   L2-L3: Mild disc bulge. No canal stenosis. Minor foraminal stenosis.   L3-L4: Mild disc bulge with endplate osteophytic ridging eccentric to the right. No canal stenosis. Minor right foraminal stenosis. No left foraminal stenosis.   L4-L5: Disc bulge with endplate osteophytic ridging eccentric to the left. Mild facet arthropathy. Disc protrusion on the prior study has resolved. No canal stenosis. No right foraminal stenosis. Minor left foraminal stenosis.   L5-S1: Imaged in the sagittal plane only. Hypoplastic disc. No canal or foraminal stenosis.   IMPRESSION: Multilevel degenerative changes as detailed above. No high-grade canal or foraminal stenosis.     Electronically Signed   By: Guadlupe Spanish M.D.   On: 06/20/2021 15:24     Objective:  VS:  HT:    WT:   BMI:     BP:130/85  HR:(!) 55bpm  TEMP: ( )  RESP:  Physical Exam Vitals and nursing note reviewed.  Constitutional:      General: She is not in acute distress.    Appearance: Normal appearance. She is not ill-appearing.  HENT:     Head: Normocephalic and atraumatic.     Right Ear: External ear normal.     Left Ear: External ear normal.  Eyes:     Extraocular Movements: Extraocular movements intact.  Cardiovascular:     Rate and Rhythm: Normal rate.     Pulses: Normal pulses.  Pulmonary:     Effort: Pulmonary effort is normal. No respiratory distress.  Abdominal:     General: There is no distension.     Palpations: Abdomen is soft.  Musculoskeletal:        General: Tenderness present.      Cervical back: Neck supple.     Right lower leg: No edema.     Left lower leg: No edema.     Comments: Patient has good distal strength with no pain over the greater trochanters.  No clonus or focal weakness.  Skin:    Findings: No erythema, lesion or rash.  Neurological:     General: No focal deficit present.     Mental Status: She is alert and oriented to person, place, and time.     Sensory: No sensory deficit.     Motor: No weakness or abnormal muscle tone.     Coordination: Coordination normal.  Psychiatric:        Mood and Affect: Mood normal.        Behavior: Behavior normal.      Imaging: No results found.

## 2023-05-10 NOTE — Procedures (Signed)
Lumbar Facet Joint Intra-Articular Injection(s) with Fluoroscopic Guidance  Patient: Robin Franklin      Date of Birth: 31-May-1961 MRN: [id number] PCP: Ellyn Hack, MD      Visit Date: 04/25/2023   Universal Protocol:    Date/Time: 04/25/2023  Consent Given By: the patient  Position: PRONE   Additional Comments: Vital signs were monitored before and after the procedure. Patient was prepped and draped in the usual sterile fashion. The correct patient, procedure, and site was verified.   Injection Procedure Details:  Procedure Site One Meds Administered:  Meds ordered this encounter  Medications   methylPREDNISolone acetate (DEPO-MEDROL) injection 80 mg     Laterality: Bilateral  Location/Site:  L4-L5  Needle size: 22 guage  Needle type: Spinal  Needle Placement: Articular  Findings:  -Comments: Excellent flow of contrast producing a partial arthrogram.  Procedure Details: The fluoroscope beam is vertically oriented in AP, and the inferior recess is visualized beneath the lower pole of the inferior apophyseal process, which represents the target point for needle insertion. When direct visualization is difficult the target point is located at the medial projection of the vertebral pedicle. The region overlying each aforementioned target is locally anesthetized with a 1 to 2 ml. volume of 1% Lidocaine without Epinephrine.   The spinal needle was inserted into each of the above mentioned facet joints using biplanar fluoroscopic guidance. A 0.25 to 0.5 ml. volume of Isovue-250 was injected and a partial facet joint arthrogram was obtained. A single spot film was obtained of the resulting arthrogram.    One to 1.25 ml of the steroid/anesthetic solution was then injected into each of the facet joints noted above.   Additional Comments:  No complications occurred Dressing: 2 x 2 sterile gauze and Band-Aid    Post-procedure details: Patient was observed during the  procedure. Post-procedure instructions were reviewed.  Patient left the clinic in stable condition.

## 2023-05-17 ENCOUNTER — Telehealth: Payer: Self-pay | Admitting: Physical Medicine and Rehabilitation

## 2023-05-17 NOTE — Telephone Encounter (Signed)
Patient called advised the injection worked for 5 days and the pain returned. Patient said she would like to know what is her next plan of care. The number to contact patient is [phone]

## 2023-05-20 ENCOUNTER — Other Ambulatory Visit: Payer: Self-pay | Admitting: Physical Medicine and Rehabilitation

## 2023-05-20 DIAGNOSIS — G8929 Other chronic pain: Secondary | ICD-10-CM

## 2023-05-20 DIAGNOSIS — M47816 Spondylosis without myelopathy or radiculopathy, lumbar region: Secondary | ICD-10-CM

## 2023-05-20 NOTE — Progress Notes (Signed)
Patient reports greater than 80% relief of pain with bilateral L4-L5 facet joint injections. We will proceed with second set of facet blocks.

## 2023-05-20 NOTE — Telephone Encounter (Signed)
Spoke with patient and she would like to go forward with the second facet injection

## 2023-06-13 ENCOUNTER — Ambulatory Visit: Payer: Medicare HMO | Admitting: Physical Medicine and Rehabilitation

## 2023-06-13 ENCOUNTER — Other Ambulatory Visit: Payer: Self-pay

## 2023-06-13 VITALS — BP 117/78 | HR 64

## 2023-06-13 DIAGNOSIS — M47816 Spondylosis without myelopathy or radiculopathy, lumbar region: Secondary | ICD-10-CM

## 2023-06-13 MED ORDER — METHYLPREDNISOLONE ACETATE 80 MG/ML IJ SUSP
80.0000 mg | Freq: Once | INTRAMUSCULAR | Status: AC
Start: 2023-06-13 — End: 2023-06-13
  Administered 2023-06-13: 80 mg

## 2023-06-13 NOTE — Patient Instructions (Signed)

## 2023-06-13 NOTE — Progress Notes (Signed)
Functional Pain Scale - descriptive words and definitions  Moderate (4)   Constantly aware of pain, can complete ADLs with modification/sleep marginally affected at times/passive distraction is of no use, but active distraction gives some relief. Moderate range order  Average Pain 5   +Driver, -BT, -Dye Allergies.  Lower back pain on both sides with no radiation in the legs

## 2023-06-17 ENCOUNTER — Encounter: Payer: Self-pay | Admitting: Physical Medicine and Rehabilitation

## 2023-06-20 NOTE — Progress Notes (Signed)
Robin Franklin - 62 y.o. female MRN [id number]  Date of birth: [birth year]  Office Visit Note: Visit Date: 06/13/2023 PCP: Ellyn Hack, MD Referred by: Ellyn Hack, MD  Subjective: Chief Complaint  Patient presents with   Lower Back - Pain   HPI:  Robin Franklin is a 62 y.o. female who comes in today at the request of Ellin Goodie, FNP for planned Bilateral  L4-5 Lumbar facet/medial branch block with fluoroscopic guidance.  The patient has failed conservative care including home exercise, medications, time and activity modification.  This injection will be diagnostic and hopefully therapeutic.  Please see requesting physician notes for further details and justification.  Exam has shown concordant pain with facet joint loading.   ROS Otherwise per HPI.  Assessment & Plan: Visit Diagnoses:    ICD-10-CM   1. Spondylosis without myelopathy or radiculopathy, lumbar region  M47.816 XR C-ARM NO REPORT    Facet Injection    methylPREDNISolone acetate (DEPO-MEDROL) injection 80 mg      Plan: No additional findings.   Meds & Orders:  Meds ordered this encounter  Medications   methylPREDNISolone acetate (DEPO-MEDROL) injection 80 mg    Orders Placed This Encounter  Procedures   Facet Injection   XR C-ARM NO REPORT    Follow-up: Return for visit to requesting provider as needed.   Procedures: No procedures performed  Lumbar Facet Joint Intra-Articular Injection(s) with Fluoroscopic Guidance  Patient: Robin Franklin      Date of Birth: [birth year] MRN: [id number] PCP: Ellyn Hack, MD      Visit Date: 06/13/2023   Universal Protocol:    Date/Time: 06/13/2023  Consent Given By: the patient  Position: PRONE   Additional Comments: Vital signs were monitored before and after the procedure. Patient was prepped and draped in the usual sterile fashion. The correct patient, procedure, and site was verified.   Injection Procedure Details:  Procedure  Site One Meds Administered:  Meds ordered this encounter  Medications   methylPREDNISolone acetate (DEPO-MEDROL) injection 80 mg     Laterality: Bilateral  Location/Site:  L4-L5  Needle size: 22 guage  Needle type: Spinal  Needle Placement: Articular  Findings:  -Comments: Excellent flow of contrast producing a partial arthrogram.  Procedure Details: The fluoroscope beam is vertically oriented in AP, and the inferior recess is visualized beneath the lower pole of the inferior apophyseal process, which represents the target point for needle insertion. When direct visualization is difficult the target point is located at the medial projection of the vertebral pedicle. The region overlying each aforementioned target is locally anesthetized with a 1 to 2 ml. volume of 1% Lidocaine without Epinephrine.   The spinal needle was inserted into each of the above mentioned facet joints using biplanar fluoroscopic guidance. A 0.25 to 0.5 ml. volume of Isovue-250 was injected and a partial facet joint arthrogram was obtained. A single spot film was obtained of the resulting arthrogram.    One to 1.25 ml of the steroid/anesthetic solution was then injected into each of the facet joints noted above.   Additional Comments:  No complications occurred Dressing: 2 x 2 sterile gauze and Band-Aid    Post-procedure details: Patient was observed during the procedure. Post-procedure instructions were reviewed.  Patient left the clinic in stable condition.    Clinical History: CLINICAL DATA:  Lumbar radiculopathy, no red flags; acute left low back pain   EXAM: MRI LUMBAR SPINE WITHOUT CONTRAST  TECHNIQUE: Multiplanar, multisequence MR imaging of the lumbar spine was performed. No intravenous contrast was administered.   COMPARISON:  04/16/2019   FINDINGS: Segmentation: Numbering is kept consistent with the prior study with partial sacralization of L5.   Alignment:  Similar  anteroposterior alignment.   Vertebrae: Increased mild degenerative endplate irregularity at L2-L3. No substantial marrow edema. Unchanged benign STIR hyperintense lesion at S2.   Conus medullaris and cauda equina: Conus extends to the L1 level. Conus and cauda equina appear normal.   Paraspinal and other soft tissues: Unremarkable.   Disc levels:   L1-L2:  Minimal disc bulge.  No canal or foraminal stenosis.   L2-L3: Mild disc bulge. No canal stenosis. Minor foraminal stenosis.   L3-L4: Mild disc bulge with endplate osteophytic ridging eccentric to the right. No canal stenosis. Minor right foraminal stenosis. No left foraminal stenosis.   L4-L5: Disc bulge with endplate osteophytic ridging eccentric to the left. Mild facet arthropathy. Disc protrusion on the prior study has resolved. No canal stenosis. No right foraminal stenosis. Minor left foraminal stenosis.   L5-S1: Imaged in the sagittal plane only. Hypoplastic disc. No canal or foraminal stenosis.   IMPRESSION: Multilevel degenerative changes as detailed above. No high-grade canal or foraminal stenosis.     Electronically Signed   By: Guadlupe Spanish M.D.   On: 06/20/2021 15:24     Objective:  VS:  HT:    WT:   BMI:     BP:117/78  HR:64bpm  TEMP: ( )  RESP:  Physical Exam Vitals and nursing note reviewed.  Constitutional:      General: She is not in acute distress.    Appearance: Normal appearance. She is not ill-appearing.  HENT:     Head: Normocephalic and atraumatic.     Right Ear: External ear normal.     Left Ear: External ear normal.  Eyes:     Extraocular Movements: Extraocular movements intact.  Cardiovascular:     Rate and Rhythm: Normal rate.     Pulses: Normal pulses.  Pulmonary:     Effort: Pulmonary effort is normal. No respiratory distress.  Abdominal:     General: There is no distension.     Palpations: Abdomen is soft.  Musculoskeletal:        General: Tenderness present.      Cervical back: Neck supple.     Right lower leg: No edema.     Left lower leg: No edema.     Comments: Patient has good distal strength with no pain over the greater trochanters.  No clonus or focal weakness.  Skin:    Findings: No erythema, lesion or rash.  Neurological:     General: No focal deficit present.     Mental Status: She is alert and oriented to person, place, and time.     Sensory: No sensory deficit.     Motor: No weakness or abnormal muscle tone.     Coordination: Coordination normal.  Psychiatric:        Mood and Affect: Mood normal.        Behavior: Behavior normal.      Imaging: No results found.

## 2023-06-20 NOTE — Procedures (Signed)
Lumbar Facet Joint Intra-Articular Injection(s) with Fluoroscopic Guidance  Patient: Robin Franklin      Date of Birth: May 26, 1961 MRN: [id number] PCP: Ellyn Hack, MD      Visit Date: 06/13/2023   Universal Protocol:    Date/Time: 06/13/2023  Consent Given By: the patient  Position: PRONE   Additional Comments: Vital signs were monitored before and after the procedure. Patient was prepped and draped in the usual sterile fashion. The correct patient, procedure, and site was verified.   Injection Procedure Details:  Procedure Site One Meds Administered:  Meds ordered this encounter  Medications   methylPREDNISolone acetate (DEPO-MEDROL) injection 80 mg     Laterality: Bilateral  Location/Site:  L4-L5  Needle size: 22 guage  Needle type: Spinal  Needle Placement: Articular  Findings:  -Comments: Excellent flow of contrast producing a partial arthrogram.  Procedure Details: The fluoroscope beam is vertically oriented in AP, and the inferior recess is visualized beneath the lower pole of the inferior apophyseal process, which represents the target point for needle insertion. When direct visualization is difficult the target point is located at the medial projection of the vertebral pedicle. The region overlying each aforementioned target is locally anesthetized with a 1 to 2 ml. volume of 1% Lidocaine without Epinephrine.   The spinal needle was inserted into each of the above mentioned facet joints using biplanar fluoroscopic guidance. A 0.25 to 0.5 ml. volume of Isovue-250 was injected and a partial facet joint arthrogram was obtained. A single spot film was obtained of the resulting arthrogram.    One to 1.25 ml of the steroid/anesthetic solution was then injected into each of the facet joints noted above.   Additional Comments:  No complications occurred Dressing: 2 x 2 sterile gauze and Band-Aid    Post-procedure details: Patient was observed during the  procedure. Post-procedure instructions were reviewed.  Patient left the clinic in stable condition.

## 2023-07-03 ENCOUNTER — Encounter (HOSPITAL_BASED_OUTPATIENT_CLINIC_OR_DEPARTMENT_OTHER): Payer: Self-pay | Admitting: Emergency Medicine

## 2023-07-03 ENCOUNTER — Emergency Department (HOSPITAL_BASED_OUTPATIENT_CLINIC_OR_DEPARTMENT_OTHER)
Admission: EM | Admit: 2023-07-03 | Discharge: 2023-07-03 | Disposition: A | Discharge status: Home / Self Care | Payer: Medicare HMO | Attending: Emergency Medicine | Admitting: Emergency Medicine

## 2023-07-03 ENCOUNTER — Emergency Department (HOSPITAL_BASED_OUTPATIENT_CLINIC_OR_DEPARTMENT_OTHER): Payer: Medicare HMO

## 2023-07-03 ENCOUNTER — Other Ambulatory Visit: Payer: Self-pay

## 2023-07-03 DIAGNOSIS — Z853 Personal history of malignant neoplasm of breast: Secondary | ICD-10-CM | POA: Diagnosis not present

## 2023-07-03 DIAGNOSIS — R0789 Other chest pain: Secondary | ICD-10-CM | POA: Insufficient documentation

## 2023-07-03 DIAGNOSIS — Z79899 Other long term (current) drug therapy: Secondary | ICD-10-CM | POA: Insufficient documentation

## 2023-07-03 DIAGNOSIS — E039 Hypothyroidism, unspecified: Secondary | ICD-10-CM | POA: Diagnosis not present

## 2023-07-03 LAB — BASIC METABOLIC PANEL
Anion gap: 10 (ref 5–15)
BUN: 14 mg/dL (ref 8–23)
CO2: 27 mmol/L (ref 22–32)
Calcium: 9.8 mg/dL (ref 8.9–10.3)
Chloride: 104 mmol/L (ref 98–111)
Creatinine, Ser: 0.88 mg/dL (ref 0.44–1.00)
GFR, Estimated: >60 mL/min (ref >60)
Glucose, Bld: 76 mg/dL (ref 70–99)
Potassium: 3.7 mmol/L (ref 3.5–5.1)
Sodium: 141 mmol/L (ref 135–145)

## 2023-07-03 LAB — CBC
HCT: 38.3 % (ref 36.0–46.0)
Hemoglobin: 13 g/dL (ref 12.0–15.0)
MCH: 28.4 pg (ref 26.0–34.0)
MCHC: 33.9 g/dL (ref 30.0–36.0)
MCV: 83.8 fL (ref 80.0–100.0)
Platelets: 211 10*3/uL (ref 150–400)
RBC: 4.57 MIL/uL (ref 3.87–5.11)
RDW: 13.2 % (ref 11.5–15.5)
WBC: 4.7 10*3/uL (ref 4.0–10.5)
nRBC: 0 % (ref 0.0–0.2)

## 2023-07-03 LAB — TROPONIN I (HIGH SENSITIVITY): Troponin I (High Sensitivity): 3 ng/L (ref <18)

## 2023-07-03 NOTE — Discharge Instructions (Addendum)
Evaluation of your chest pain was overall reassuring.  Recommend you follow-up with your PCP.  If your chest pain returns, you have shortness of breath, calf tenderness, cough and fever or any other concerning symptom please return emergency department further evaluation.

## 2023-07-03 NOTE — ED Provider Notes (Signed)
[place] EMERGENCY DEPARTMENT AT [facility] Provider Note   CSN: [id number] Arrival date & time: 07/03/23  1701     History  Chief Complaint  Patient presents with   Chest Pain   HPI Robin Franklin is a 62 y.o. female with remote history of breast cancer, hypothyroidism presenting for chest pain.  Started last night.  Was a sharp pain.  It was nonradiating and nonpleuritic.  Denies associated shortness of breath. States that seem to be worse when she was moving her upper body.  States she took an ibuprofen portion went to sleep last night woke up the next morning the pain is gone.  States she had a "quick burst" of the same chest pain this morning.  Lasted for a few seconds.  Again no shortness of breath.  At this time patient states that chest pain is resolved for now but does hurt if she presses on the center of her chest.   Chest Pain      Home Medications Prior to Admission medications   Medication Sig Start Date End Date Taking? Authorizing Provider  ALPRAZolam Prudy Feeler) 0.5 MG tablet Take 1 tablet (0.5 mg total) by mouth at bedtime as needed for anxiety. 05/01/21   Hilts, Casimiro Needle, MD  ascorbic acid (VITAMIN C) 500 MG tablet Take 500 mg by mouth daily.    [provider]  Bioflavonoid Products (GRAPE SEED PO) Take 1 capsule by mouth daily.    [provider]  cetirizine (ZYRTEC) 10 MG tablet Take 10 mg by mouth daily as needed for allergies. 12/07/21   [provider]  Cholecalciferol (VITAMIN D3) 125 MCG (5000 UT) CAPS Take 5,000 Units by mouth daily.    [provider]  cyclobenzaprine (FLEXERIL) 10 MG tablet Take 1 tablet (10 mg total) by mouth 3 (three) times daily as needed for muscle spasms. 05/11/20   Hilts, Casimiro Needle, MD  fluticasone (FLONASE) 50 MCG/ACT nasal spray Place 1 spray into both nostrils daily as needed for allergies or rhinitis.    [provider]  levothyroxine (SYNTHROID) 75 MCG tablet TAKE 1 TABLET BY  MOUTH DAILY BEFORE BREAKFAST. 05/02/21   Hilts, Casimiro Needle, MD  MAGNESIUM GLUCONATE PO Take 2 each by mouth at bedtime.    [provider]  meloxicam (MOBIC) 7.5 MG tablet TAKE 1 TABLET BY MOUTH 2 TIMES DAILY AS NEEDED FOR PAIN. Patient taking differently: Take 7.5 mg by mouth daily as needed for pain. 03/13/21   Hilts, Casimiro Needle, MD  Menthol-Camphor (ICY HOT ADVANCED PAIN RELIEF) 16-11 % CREA Apply 1 application topically daily as needed (pain).    [provider]  Multiple Vitamins-Minerals (MULTIVITAMIN ADULT) CHEW Chew 2 each by mouth daily.    [provider]  omeprazole (PRILOSEC) 40 MG capsule Take 40 mg by mouth daily. 12/19/21   [provider]  ondansetron (ZOFRAN) 4 MG tablet Take 4 mg by mouth 4 (four) times daily as needed for nausea or vomiting. 12/19/21   [provider]  Quercetin 500 MG CAPS Take 500 mg by mouth daily.    [provider]  Selenium 100 MCG CAPS Take 100 mcg by mouth daily.    [provider]  Turmeric 500 MG CAPS Take 500 mg by mouth daily.    [provider]  vitamin B-12 (CYANOCOBALAMIN) 1000 MCG tablet Take 1,000 mcg by mouth daily.    [provider]  vitamin k 100 MCG tablet Take 100 mcg by mouth daily.    [provider]      Allergies    Patient has no known allergies.    Review of Systems   Review of Systems  Cardiovascular:  Positive for chest pain.    Physical Exam   Vitals:   07/03/23 1800 07/03/23 1830  BP: 114/85 (!) 135/95  Pulse: 63 63  Resp: 14 17  Temp:    SpO2: 99% 99%    CONSTITUTIONAL:  well-appearing, NAD NEURO:  Alert and oriented x 3, CN 3-12 grossly intact EYES:  eyes equal and reactive ENT/NECK:  Supple, no stridor  Chest: Midsternal TTP CARDIO:  Regular rate and rhythm, appears well-perfused  PULM:  No respiratory distress, CTAB GI/GU:  non-distended, soft MSK/SPINE:  No gross deformities, no edema, moves all extremities  SKIN:  no rash,  atraumatic  *Additional and/or pertinent findings included in MDM below  ED Results / Procedures / Treatments   Labs (all labs ordered are listed, but only abnormal results are displayed) Labs Reviewed  BASIC METABOLIC PANEL  CBC  TROPONIN I (HIGH SENSITIVITY)    EKG None  Radiology DG Chest Port 1 View  Result Date: 07/03/2023 CLINICAL DATA:  Chest pain for 1 week, initial encounter EXAM: PORTABLE CHEST 1 VIEW COMPARISON:  07/27/2020 FINDINGS: The heart size and mediastinal contours are within normal limits. Both lungs are clear. The visualized skeletal structures are unremarkable. IMPRESSION: No active disease. Electronically Signed   By: Alcide Clever M.D.   On: 07/03/2023 18:59    Procedures Procedures    Medications Ordered in ED Medications - No data to display  ED Course/ Medical Decision Making/ A&P                                 Medical Decision Making Amount and/or Complexity of Data Reviewed Labs: ordered. Radiology: ordered.   Initial Impression and Ddx 62 year old well-appearing female presenting for chest pain.  Exam was notable for midsternal chest wall tenderness.  DDx includes PE, ACS, MSK, pneumonia, rib fracture, pneumothorax. Patient PMH that increases complexity of ED encounter: Remote history of breast cancer  Interpretation of Diagnostics - I independent reviewed and interpreted the labs as followed: No acute findings  - I independently visualized the following imaging with scope of interpretation limited to determining acute life threatening conditions related to emergency care: cxr, which revealed no acute cardio or pulmonary processes  -I personally reviewed interpret EKG which revealed normal sinus rhythm  Patient Reassessment and Ultimate Disposition/Management On reassessment, patient remained clinically well-appearing, no acute distress and hemodynamically stable.  Remained asymptomatic.  Doubt ACS given reassuring workup.  Considered PE  but unlikely given patient is low risk per [place], not short of breath, lack of risk factors and no chest pain at this time.  Suspect MSK etiology.  Recommended conservative treatment at home.  Advised to follow with her PCP.  Discussed return precautions.  Vital stable.  Patient management required discussion with the following services or consulting groups:  None  Complexity of Problems Addressed Acute complicated illness or Injury  Additional Data Reviewed and Analyzed Further history obtained from: Further history from spouse/family member, Past medical history and medications listed in the EMR, and Prior ED visit notes  Patient Encounter Risk Assessment None         Final Clinical Impression(s) / ED Diagnoses Final diagnoses:  Atypical chest pain    Rx / DC Orders ED Discharge Orders  None         Gareth Eagle, PA-C 07/03/23 1911    Arby Barrette, MD 07/06/23 [phone]

## 2023-07-03 NOTE — ED Notes (Signed)
Pt received AVS; Verbalized understanding on d/c instructions. No further questions upon discharge.

## 2023-07-03 NOTE — ED Triage Notes (Signed)
Pt presents for sharp central CP that has been intermittent over the last week but is a recurrent issue for her, especially after physically exerting activities. Would also like staff to know that she has also been experiencing cramps in her feet, was directed by PCP to drink water and eat bananas. No radiation, denies nausea, vomiting, fever, sob  H/o BCA

## 2023-07-03 NOTE — ED Notes (Signed)
Pt declines xray in triage, would like to see MD before this is ordered

## 2023-07-03 NOTE — ED Notes (Signed)
Pink bracelet/limb restriction applied to left arm

## 2024-02-10 ENCOUNTER — Ambulatory Visit
Admission: RE | Admit: 2024-02-10 | Discharge: 2024-02-10 | Disposition: A | Discharge status: Home / Self Care | Source: Ambulatory Visit | Attending: Family Medicine | Admitting: Family Medicine

## 2024-02-10 ENCOUNTER — Other Ambulatory Visit: Payer: Self-pay | Admitting: Family Medicine

## 2024-02-10 DIAGNOSIS — M25561 Pain in right knee: Secondary | ICD-10-CM

## 2024-02-11 ENCOUNTER — Other Ambulatory Visit: Payer: Self-pay | Admitting: Family Medicine

## 2024-02-11 DIAGNOSIS — Z1231 Encounter for screening mammogram for malignant neoplasm of breast: Secondary | ICD-10-CM

## 2024-02-26 ENCOUNTER — Emergency Department (HOSPITAL_COMMUNITY)
Admission: EM | Admit: 2024-02-26 | Discharge: 2024-02-26 | Disposition: A | Discharge status: Home / Self Care | Attending: Emergency Medicine | Admitting: Emergency Medicine

## 2024-02-26 ENCOUNTER — Other Ambulatory Visit: Payer: Self-pay

## 2024-02-26 ENCOUNTER — Encounter (HOSPITAL_COMMUNITY): Payer: Self-pay | Admitting: Emergency Medicine

## 2024-02-26 DIAGNOSIS — Z853 Personal history of malignant neoplasm of breast: Secondary | ICD-10-CM | POA: Insufficient documentation

## 2024-02-26 DIAGNOSIS — R112 Nausea with vomiting, unspecified: Secondary | ICD-10-CM | POA: Diagnosis present

## 2024-02-26 LAB — URINALYSIS, ROUTINE W REFLEX MICROSCOPIC
Bacteria, UA: NONE SEEN
Bilirubin Urine: NEGATIVE
Glucose, UA: NEGATIVE mg/dL
Hgb urine dipstick: NEGATIVE
Ketones, ur: 20 mg/dL — AB
Leukocytes,Ua: NEGATIVE
Nitrite: NEGATIVE
Protein, ur: 30 mg/dL — AB
Specific Gravity, Urine: 1.015 (ref 1.005–1.030)
pH: 8 (ref 5.0–8.0)

## 2024-02-26 LAB — CBC
HCT: 38.7 % (ref 36.0–46.0)
Hemoglobin: 12.8 g/dL (ref 12.0–15.0)
MCH: 27.7 pg (ref 26.0–34.0)
MCHC: 33.1 g/dL (ref 30.0–36.0)
MCV: 83.8 fL (ref 80.0–100.0)
Platelets: 215 10*3/uL (ref 150–400)
RBC: 4.62 MIL/uL (ref 3.87–5.11)
RDW: 13 % (ref 11.5–15.5)
WBC: 8 10*3/uL (ref 4.0–10.5)
nRBC: 0 % (ref 0.0–0.2)

## 2024-02-26 LAB — LIPASE, BLOOD: Lipase: 30 U/L (ref 11–51)

## 2024-02-26 LAB — COMPREHENSIVE METABOLIC PANEL WITH GFR
ALT: 16 U/L (ref 0–44)
AST: 22 U/L (ref 15–41)
Albumin: 4.6 g/dL (ref 3.5–5.0)
Alkaline Phosphatase: 57 U/L (ref 38–126)
Anion gap: 13 (ref 5–15)
BUN: 15 mg/dL (ref 8–23)
CO2: 23 mmol/L (ref 22–32)
Calcium: 9.7 mg/dL (ref 8.9–10.3)
Chloride: 103 mmol/L (ref 98–111)
Creatinine, Ser: 0.87 mg/dL (ref 0.44–1.00)
GFR, Estimated: >60 mL/min (ref >60)
Glucose, Bld: 132 mg/dL — ABNORMAL HIGH (ref 70–99)
Potassium: 3.6 mmol/L (ref 3.5–5.1)
Sodium: 139 mmol/L (ref 135–145)
Total Bilirubin: 0.7 mg/dL (ref 0.0–1.2)
Total Protein: 7.7 g/dL (ref 6.5–8.1)

## 2024-02-26 MED ORDER — ONDANSETRON 8 MG PO TBDP: ORAL_TABLET | ORAL | 0 refills | Status: DC

## 2024-02-26 MED ORDER — KETOROLAC TROMETHAMINE 30 MG/ML IJ SOLN
30.0000 mg | Freq: Once | INTRAMUSCULAR | Status: DC
  Filled 2024-02-26: qty 1

## 2024-02-26 MED ORDER — ONDANSETRON HCL 4 MG/2ML IJ SOLN
4.0000 mg | Freq: Once | INTRAMUSCULAR | Status: AC
  Administered 2024-02-26: 4 mg via INTRAVENOUS
  Filled 2024-02-26: qty 2

## 2024-02-26 MED ORDER — ONDANSETRON 8 MG PO TBDP
8.0000 mg | ORAL_TABLET | Freq: Once | ORAL | Status: AC
  Administered 2024-02-26: 8 mg via ORAL
  Filled 2024-02-26: qty 1

## 2024-02-26 MED ORDER — SODIUM CHLORIDE 0.9 % IV BOLUS
1000.0000 mL | Freq: Once | INTRAVENOUS | Status: AC
  Administered 2024-02-26: 1000 mL via INTRAVENOUS

## 2024-02-26 NOTE — ED Triage Notes (Signed)
 Pt arrives to ED POV c/o nausea and vomiting since about 11pm last night. Pt states she thinks she may have food poisoning, denies any sick exposures.

## 2024-02-26 NOTE — Discharge Instructions (Signed)
Begin taking Zofran as prescribed as needed for nausea.  Clear liquid diet for the next 12 hours, then slowly advance diet as tolerated.  Return to the emergency department if you develop severe abdominal pain, high fevers, bloody stools, or for other new and concerning symptoms. 

## 2024-02-26 NOTE — ED Provider Notes (Signed)
 [place] EMERGENCY DEPARTMENT AT [facility] Provider Note   CSN: [id number] Arrival date & time: 02/26/24  0249     History  Chief Complaint  Patient presents with   Emesis    Robin Franklin is a 62 y.o. female.  Patient is a 63 year old female with history of GERD and breast cancer.  Patient presenting today with complaints of nausea and vomiting.  This started earlier this evening shortly after eating.  She reports multiple episodes of emesis and difficulty keeping anything down.  She denies any diarrhea.  No abdominal pain.  No alleviating factors.       Home Medications Prior to Admission medications   Medication Sig Start Date End Date Taking? Authorizing Provider  ALPRAZolam Prudy Feeler) 0.5 MG tablet Take 1 tablet (0.5 mg total) by mouth at bedtime as needed for anxiety. 05/01/21   Hilts, Casimiro Needle, MD  ascorbic acid (VITAMIN C) 500 MG tablet Take 500 mg by mouth daily.    [provider]  Bioflavonoid Products (GRAPE SEED PO) Take 1 capsule by mouth daily.    [provider]  cetirizine (ZYRTEC) 10 MG tablet Take 10 mg by mouth daily as needed for allergies. 12/07/21   [provider]  Cholecalciferol (VITAMIN D3) 125 MCG (5000 UT) CAPS Take 5,000 Units by mouth daily.    [provider]  cyclobenzaprine (FLEXERIL) 10 MG tablet Take 1 tablet (10 mg total) by mouth 3 (three) times daily as needed for muscle spasms. 05/11/20   Hilts, Casimiro Needle, MD  fluticasone (FLONASE) 50 MCG/ACT nasal spray Place 1 spray into both nostrils daily as needed for allergies or rhinitis.    [provider]  levothyroxine (SYNTHROID) 75 MCG tablet TAKE 1 TABLET BY MOUTH DAILY BEFORE BREAKFAST. 05/02/21   Hilts, Casimiro Needle, MD  MAGNESIUM GLUCONATE PO Take 2 each by mouth at bedtime.    [provider]  meloxicam (MOBIC) 7.5 MG tablet TAKE 1 TABLET BY MOUTH 2 TIMES DAILY AS NEEDED FOR PAIN. Patient taking differently: Take 7.5 mg by mouth daily  as needed for pain. 03/13/21   Hilts, Casimiro Needle, MD  Menthol-Camphor (ICY HOT ADVANCED PAIN RELIEF) 16-11 % CREA Apply 1 application topically daily as needed (pain).    [provider]  Multiple Vitamins-Minerals (MULTIVITAMIN ADULT) CHEW Chew 2 each by mouth daily.    [provider]  omeprazole (PRILOSEC) 40 MG capsule Take 40 mg by mouth daily. 12/19/21   [provider]  ondansetron (ZOFRAN) 4 MG tablet Take 4 mg by mouth 4 (four) times daily as needed for nausea or vomiting. 12/19/21   [provider]  Quercetin 500 MG CAPS Take 500 mg by mouth daily.    [provider]  Selenium 100 MCG CAPS Take 100 mcg by mouth daily.    [provider]  Turmeric 500 MG CAPS Take 500 mg by mouth daily.    [provider]  vitamin B-12 (CYANOCOBALAMIN) 1000 MCG tablet Take 1,000 mcg by mouth daily.    [provider]  vitamin k 100 MCG tablet Take 100 mcg by mouth daily.    [provider]      Allergies    Patient has no known allergies.    Review of Systems   Review of Systems  All other systems reviewed and are negative.   Physical Exam Updated Vital Signs BP 114/84 (BP Location: Right Arm)   Pulse 67   Temp 98.3 F (36.8 C) (Oral)   Resp  16   Ht 5\' 6"  (1.676 m)   Wt 75 kg   LMP 07/20/2013 (Approximate) Comment: As told to Technologist  SpO2 100%   BMI 26.69 kg/m  Physical Exam Vitals and nursing note reviewed.  Constitutional:      General: She is not in acute distress.    Appearance: She is well-developed. She is not diaphoretic.  HENT:     Head: Normocephalic and atraumatic.  Cardiovascular:     Rate and Rhythm: Normal rate and regular rhythm.     Heart sounds: No murmur heard.    No friction rub. No gallop.  Pulmonary:     Effort: Pulmonary effort is normal. No respiratory distress.     Breath sounds: Normal breath sounds. No wheezing.  Abdominal:     General: Bowel sounds are normal. There is no  distension.     Palpations: Abdomen is soft.     Tenderness: There is no abdominal tenderness.  Musculoskeletal:        General: Normal range of motion.     Cervical back: Normal range of motion and neck supple.  Skin:    General: Skin is warm and dry.  Neurological:     General: No focal deficit present.     Mental Status: She is alert and oriented to person, place, and time.     ED Results / Procedures / Treatments   Labs (all labs ordered are listed, but only abnormal results are displayed) Labs Reviewed  COMPREHENSIVE METABOLIC PANEL WITH GFR - Abnormal; Notable for the following components:      Result Value   Glucose, Bld 132 (*)    All other components within normal limits  LIPASE, BLOOD  CBC  URINALYSIS, ROUTINE W REFLEX MICROSCOPIC    EKG None  Radiology No results found.  Procedures Procedures    Medications Ordered in ED Medications  sodium chloride 0.9 % bolus 1,000 mL (has no administration in time range)  ondansetron (ZOFRAN) injection 4 mg (has no administration in time range)  ketorolac (TORADOL) 30 MG/ML injection 30 mg (has no administration in time range)  ondansetron (ZOFRAN-ODT) disintegrating tablet 8 mg (8 mg Oral Given 02/26/24 0302)    ED Course/ Medical Decision Making/ A&P  Patient is a 63 year old female presenting with nausea and vomiting as described in the HPI.  Patient arrives here with stable vital signs and is afebrile.  Physical examination reveals a benign abdomen.  Laboratory studies obtained including CBC, CMP, and lipase.  There is no leukocytosis, no elevation of liver or pancreatic enzymes, and no electrolyte derangement.  Patient has been hydrated with normal saline and given Zofran for nausea and Toradol for discomfort.  She seems to be feeling much better.  I highly suspect a viral gastroenteritis as the cause of her symptoms and believe she can safely be discharged.  I will prescribe Zofran and have patient follow-up as  needed.  Final Clinical Impression(s) / ED Diagnoses Final diagnoses:  None    Rx / DC Orders ED Discharge Orders     None         Geoffery Lyons, MD 02/26/24 [phone]

## 2024-04-09 ENCOUNTER — Ambulatory Visit
Admission: RE | Admit: 2024-04-09 | Discharge: 2024-04-09 | Disposition: A | Discharge status: Home / Self Care | Source: Ambulatory Visit | Attending: Family Medicine | Admitting: Family Medicine

## 2024-04-09 DIAGNOSIS — Z1231 Encounter for screening mammogram for malignant neoplasm of breast: Secondary | ICD-10-CM

## 2024-04-09 HISTORY — DX: Encounter for nonprocreative screening for genetic disease carrier status: Z13.71

## 2024-04-16 ENCOUNTER — Other Ambulatory Visit: Payer: Self-pay | Admitting: Family Medicine

## 2024-04-16 DIAGNOSIS — R928 Other abnormal and inconclusive findings on diagnostic imaging of breast: Secondary | ICD-10-CM

## 2024-04-29 ENCOUNTER — Ambulatory Visit
Admission: RE | Admit: 2024-04-29 | Discharge: 2024-04-29 | Disposition: A | Discharge status: Home / Self Care | Source: Ambulatory Visit | Attending: Family Medicine | Admitting: Family Medicine

## 2024-04-29 ENCOUNTER — Other Ambulatory Visit: Payer: Self-pay | Admitting: Family Medicine

## 2024-04-29 DIAGNOSIS — R928 Other abnormal and inconclusive findings on diagnostic imaging of breast: Secondary | ICD-10-CM

## 2024-04-29 DIAGNOSIS — N6320 Unspecified lump in the left breast, unspecified quadrant: Secondary | ICD-10-CM

## 2024-05-01 ENCOUNTER — Ambulatory Visit
Admission: RE | Admit: 2024-05-01 | Discharge: 2024-05-01 | Disposition: A | Discharge status: Home / Self Care | Source: Ambulatory Visit | Attending: Family Medicine | Admitting: Family Medicine

## 2024-05-01 DIAGNOSIS — N6320 Unspecified lump in the left breast, unspecified quadrant: Secondary | ICD-10-CM

## 2024-05-01 HISTORY — PX: BREAST BIOPSY: SHX20

## 2024-05-04 LAB — SURGICAL PATHOLOGY

## 2024-06-25 ENCOUNTER — Ambulatory Visit (INDEPENDENT_AMBULATORY_CARE_PROVIDER_SITE_OTHER)

## 2024-06-25 ENCOUNTER — Ambulatory Visit: Admitting: Podiatry

## 2024-06-25 ENCOUNTER — Encounter: Payer: Self-pay | Admitting: Podiatry

## 2024-06-25 DIAGNOSIS — G5781 Other specified mononeuropathies of right lower limb: Secondary | ICD-10-CM

## 2024-06-25 DIAGNOSIS — S92314A Nondisplaced fracture of first metatarsal bone, right foot, initial encounter for closed fracture: Secondary | ICD-10-CM

## 2024-06-25 DIAGNOSIS — M7751 Other enthesopathy of right foot: Secondary | ICD-10-CM | POA: Diagnosis not present

## 2024-06-25 MED ORDER — TRIAMCINOLONE ACETONIDE 40 MG/ML IJ SUSP
20.0000 mg | Freq: Once | INTRAMUSCULAR | Status: AC
  Administered 2024-06-25: 20 mg

## 2024-06-25 NOTE — Progress Notes (Signed)
 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 female presents with the above complaint.   ROS: Denies fever chills nausea mobic  muscle aches pains calf pain back pain chest pain shortness of breath.  Past Medical History:  Diagnosis Date   Anxiety    Arthritis    BRCA negative    Breast cancer (HCC)    Family history of breast cancer    Headache(784.0)    Hypothyroidism    Joint pain    Personal history of chemotherapy    Personal history of radiation therapy    S/P radiation therapy 05/17/2015 through 06/24/2015                                                      Left breast 5040 cGy in 28 sessions; left supraclavicular/axillary region 5040 cGy in 28 sessions                         Past Surgical History:  Procedure Laterality Date   BREAST BIOPSY Left 01/02/2021   BREAST BIOPSY Left 05/01/2024   US  LT BREAST BX W LOC DEV 1ST LESION IMG BX SPEC US  GUIDE 05/01/2024 [facility]   BREAST LUMPECTOMY Left 2015   BREAST REDUCTION SURGERY     CERVICAL CONE BIOPSY     COLONOSCOPY  11/06/2011   Procedure: COLONOSCOPY;  Surgeon: Margo CHRISTELLA Haddock, MD;  Location: [facility];  Service: Endoscopy;  Laterality: N/A;  11:30 AM   COLONOSCOPY WITH PROPOFOL  N/A 01/09/2022   Procedure: COLONOSCOPY WITH PROPOFOL ;  Surgeon: Cindie Carlin POUR, DO;  Location: [facility];  Service: Endoscopy;  Laterality: N/A;  12:00 / ASA 2   cyst removed from neck     REDUCTION MAMMAPLASTY Bilateral 2001   STERIOD INJECTION      Current Outpatient Medications:    ALPRAZolam  (XANAX ) 0.5 MG tablet, Take 1 tablet  (0.5 mg total) by mouth at bedtime as needed for anxiety., Disp: 30 tablet, Rfl: 1   cetirizine (ZYRTEC) 10 MG tablet, Take 10 mg by mouth daily as needed for allergies., Disp: , Rfl:    Cholecalciferol (VITAMIN D3) 125 MCG (5000 UT) CAPS, Take 5,000 Units by mouth daily., Disp: , Rfl:    levothyroxine  (SYNTHROID ) 75 MCG tablet, TAKE 1 TABLET BY MOUTH DAILY BEFORE BREAKFAST., Disp: 90 tablet, Rfl: 1   MAGNESIUM GLUCONATE PO, Take 2 each by mouth at bedtime., Disp: , Rfl:    meloxicam  (MOBIC ) 7.5 MG tablet, TAKE 1 TABLET BY MOUTH 2 TIMES DAILY AS NEEDED FOR PAIN. (Patient taking differently: as needed.), Disp: 60 tablet, Rfl: 3   Menthol-Camphor (ICY HOT ADVANCED PAIN RELIEF) 16-11 % CREA, Apply 1 application topically daily as needed (pain)., Disp: , Rfl:    VITAMIN A PO, Take by mouth., Disp: , Rfl:    vitamin k  100 MCG tablet, Take 100 mcg by mouth daily., Disp: , Rfl:    Zinc Sulfate (ZINC-220 PO), Take by mouth., Disp: , Rfl:    Multiple Vitamins-Minerals (MULTIVITAMIN ADULT) CHEW, Chew 2 each by mouth daily., Disp: , Rfl:   No Known Allergies Review of Systems Objective:  There were no vitals filed for this visit.  General: Well developed, nourished, in no acute distress, alert and oriented x3   Dermatological: Skin is warm, dry and supple bilateral. Nails x 10 are well maintained; remaining integument appears unremarkable at this time. There are no open sores, no preulcerative lesions, no rash or signs of infection present.  Vascular: Dorsalis Pedis artery and Posterior Tibial artery pedal pulses are 2/4 bilateral with immedate capillary fill time. Pedal hair growth present. No varicosities and no lower extremity edema present bilateral.   Neruologic: Grossly intact via light touch bilateral. Vibratory intact via tuning fork bilateral. Protective threshold with Semmes Wienstein monofilament intact to all pedal sites bilateral. Patellar and Achilles deep tendon reflexes 2+ bilateral. No  Babinski or clonus noted bilateral.  Pain on palpation third interdigital space of the right foot with very mild Mulder's click.  Musculoskeletal: No gross boney pedal deformities bilateral. No pain, crepitus, or limitation noted with foot and ankle range of motion bilateral. Muscular strength 5/5 in all groups tested bilateral.  Tenderness on palpation of the medial first metatarsal phalangeal joint.  Mild swelling is noted.  No erythema cellulitis drainage or odor.  Gait: Unassisted, Nonantalgic.    Radiographs:  Small avulsion from the medial collateral ligament of the first metatarsal phalangeal joint.  Otherwise soft tissue swelling is noted no acute findings noted.  Assessment & Plan:   Assessment:  neuroma third interdigital space right foot.  Avulsion fracture medial collateral ligament of the first metatarsal phalangeal joint right foot.  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  She also has this in her hands reportedly.  Plan: I injected his third interdigital space today with dexamethasone  and local anesthetic.  Tolerated procedure well without complications.  I also recommended nerve Brye for her nerve symptoms.     Mayeli Bornhorst T. [place], [place]

## 2024-06-26 ENCOUNTER — Telehealth: Payer: Self-pay | Admitting: Podiatry

## 2024-06-26 NOTE — Telephone Encounter (Signed)
 Received injection yesterday on right side and that side is doing well. Big toe on left side pain is rated a 5 when she is in motion. Can this be a symptom of the Cortizone shot ?

## 2024-06-29 NOTE — Telephone Encounter (Signed)
 Spoke to patient informed that the injection would not be the cause she stated she has been over doing with walking but today ids feeling better symptoms have resolved.

## 2024-08-06 ENCOUNTER — Ambulatory Visit: Admitting: Podiatry

## 2024-11-10 ENCOUNTER — Telehealth: Payer: Self-pay

## 2024-11-10 NOTE — Telephone Encounter (Signed)
 Patient came by the office asking will you take her on as a new patient you currently see her husband gary Hahne dob 06/24/1954.

## 2024-11-20 LAB — HM PAP SMEAR: HPV, high-risk: NEGATIVE

## 2025-01-27 ENCOUNTER — Ambulatory Visit: Payer: Self-pay | Admitting: Internal Medicine
